# Patient Record
Sex: Female | Born: 1960 | Race: White | Hispanic: No | Marital: Married | Smoking: Never smoker
Health system: Southern US, Community
[De-identification: ages and names within clinical notes are randomized; demographics above are authoritative.]

## PROBLEM LIST (undated history)

## (undated) DIAGNOSIS — E785 Hyperlipidemia, unspecified: Secondary | ICD-10-CM

## (undated) DIAGNOSIS — H9193 Unspecified hearing loss, bilateral: Secondary | ICD-10-CM

## (undated) DIAGNOSIS — H9190 Unspecified hearing loss, unspecified ear: Secondary | ICD-10-CM

## (undated) DIAGNOSIS — G4733 Obstructive sleep apnea (adult) (pediatric): Secondary | ICD-10-CM

## (undated) DIAGNOSIS — T8859XA Other complications of anesthesia, initial encounter: Secondary | ICD-10-CM

## (undated) DIAGNOSIS — F329 Major depressive disorder, single episode, unspecified: Secondary | ICD-10-CM

## (undated) DIAGNOSIS — G629 Polyneuropathy, unspecified: Secondary | ICD-10-CM

## (undated) DIAGNOSIS — K5790 Diverticulosis of intestine, part unspecified, without perforation or abscess without bleeding: Secondary | ICD-10-CM

## (undated) DIAGNOSIS — K429 Umbilical hernia without obstruction or gangrene: Secondary | ICD-10-CM

## (undated) DIAGNOSIS — G43909 Migraine, unspecified, not intractable, without status migrainosus: Secondary | ICD-10-CM

## (undated) DIAGNOSIS — Z8669 Personal history of other diseases of the nervous system and sense organs: Secondary | ICD-10-CM

## (undated) DIAGNOSIS — K760 Fatty (change of) liver, not elsewhere classified: Secondary | ICD-10-CM

## (undated) DIAGNOSIS — J45909 Unspecified asthma, uncomplicated: Secondary | ICD-10-CM

## (undated) DIAGNOSIS — E669 Obesity, unspecified: Secondary | ICD-10-CM

## (undated) DIAGNOSIS — T4145XA Adverse effect of unspecified anesthetic, initial encounter: Secondary | ICD-10-CM

## (undated) DIAGNOSIS — IMO0001 Reserved for inherently not codable concepts without codable children: Secondary | ICD-10-CM

## (undated) DIAGNOSIS — M1990 Unspecified osteoarthritis, unspecified site: Secondary | ICD-10-CM

## (undated) DIAGNOSIS — F32A Depression, unspecified: Secondary | ICD-10-CM

## (undated) DIAGNOSIS — M722 Plantar fascial fibromatosis: Secondary | ICD-10-CM

## (undated) HISTORY — PX: COLONOSCOPY: SHX174

## (undated) HISTORY — PX: NECK SURGERY: SHX720

## (undated) HISTORY — PX: CARPAL TUNNEL RELEASE: SHX101

## (undated) HISTORY — DX: Polyneuropathy, unspecified: G62.9

## (undated) HISTORY — PX: DENTAL SURGERY: SHX609

## (undated) HISTORY — DX: Unspecified hearing loss, bilateral: H91.93

## (undated) HISTORY — DX: Major depressive disorder, single episode, unspecified: F32.9

## (undated) HISTORY — PX: COCHLEAR IMPLANT: SUR684

## (undated) HISTORY — PX: UPPER GI ENDOSCOPY: SHX6162

## (undated) HISTORY — DX: Unspecified hearing loss, unspecified ear: H91.90

## (undated) HISTORY — PX: SHOULDER ARTHROSCOPY: SHX128

## (undated) HISTORY — DX: Hyperlipidemia, unspecified: E78.5

## (undated) HISTORY — PX: ABDOMINAL HYSTERECTOMY: SHX81

## (undated) HISTORY — DX: Depression, unspecified: F32.A

## (undated) HISTORY — PX: INNER EAR SURGERY: SHX679

## (undated) HISTORY — DX: Reserved for inherently not codable concepts without codable children: IMO0001

---
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
## 1998-04-27 ENCOUNTER — Other Ambulatory Visit: Admission: RE | Admit: 1998-04-27 | Discharge: 1998-04-27 | Payer: Self-pay | Admitting: Obstetrics and Gynecology

## 1998-06-08 ENCOUNTER — Encounter: Admission: RE | Admit: 1998-06-08 | Discharge: 1998-09-06 | Payer: Self-pay | Admitting: Gynecology

## 1998-09-23 ENCOUNTER — Inpatient Hospital Stay (HOSPITAL_COMMUNITY): Admission: AD | Admit: 1998-09-23 | Discharge: 1998-09-23 | Payer: Self-pay | Admitting: Obstetrics & Gynecology

## 1998-09-30 ENCOUNTER — Ambulatory Visit: Admission: RE | Admit: 1998-09-30 | Discharge: 1998-09-30 | Payer: Self-pay | Admitting: Obstetrics and Gynecology

## 1998-11-15 ENCOUNTER — Inpatient Hospital Stay (HOSPITAL_COMMUNITY): Admission: AD | Admit: 1998-11-15 | Discharge: 1998-11-15 | Payer: Self-pay | Admitting: Obstetrics & Gynecology

## 1998-12-11 ENCOUNTER — Inpatient Hospital Stay (HOSPITAL_COMMUNITY): Admission: AD | Admit: 1998-12-11 | Discharge: 1998-12-15 | Payer: Self-pay | Admitting: Obstetrics and Gynecology

## 1998-12-16 ENCOUNTER — Encounter (HOSPITAL_COMMUNITY): Admission: RE | Admit: 1998-12-16 | Discharge: 1999-03-16 | Payer: Self-pay | Admitting: Obstetrics and Gynecology

## 1999-08-02 ENCOUNTER — Other Ambulatory Visit: Admission: RE | Admit: 1999-08-02 | Discharge: 1999-08-02 | Payer: Self-pay | Admitting: Obstetrics and Gynecology

## 1999-09-25 ENCOUNTER — Encounter: Admission: RE | Admit: 1999-09-25 | Discharge: 1999-12-24 | Payer: Self-pay | Admitting: Anesthesiology

## 1999-12-04 ENCOUNTER — Encounter: Payer: Self-pay | Admitting: Family Medicine

## 1999-12-04 ENCOUNTER — Encounter: Admission: RE | Admit: 1999-12-04 | Discharge: 1999-12-04 | Payer: Self-pay | Admitting: Family Medicine

## 2000-01-09 ENCOUNTER — Encounter: Payer: Self-pay | Admitting: Emergency Medicine

## 2000-01-09 ENCOUNTER — Emergency Department (HOSPITAL_COMMUNITY): Admission: EM | Admit: 2000-01-09 | Discharge: 2000-01-09 | Payer: Self-pay | Admitting: Emergency Medicine

## 2000-01-25 ENCOUNTER — Encounter: Admission: RE | Admit: 2000-01-25 | Discharge: 2000-04-01 | Payer: Self-pay | Admitting: Family Medicine

## 2000-02-29 ENCOUNTER — Emergency Department (HOSPITAL_COMMUNITY): Admission: EM | Admit: 2000-02-29 | Discharge: 2000-02-29 | Payer: Self-pay | Admitting: *Deleted

## 2000-04-10 ENCOUNTER — Encounter: Admission: RE | Admit: 2000-04-10 | Discharge: 2000-07-09 | Payer: Self-pay | Admitting: Family Medicine

## 2001-02-19 ENCOUNTER — Other Ambulatory Visit: Admission: RE | Admit: 2001-02-19 | Discharge: 2001-02-19 | Payer: Self-pay | Admitting: Obstetrics and Gynecology

## 2001-09-24 ENCOUNTER — Ambulatory Visit (HOSPITAL_BASED_OUTPATIENT_CLINIC_OR_DEPARTMENT_OTHER): Admission: RE | Admit: 2001-09-24 | Discharge: 2001-09-24 | Payer: Self-pay | Admitting: Orthopedic Surgery

## 2002-09-23 ENCOUNTER — Inpatient Hospital Stay (HOSPITAL_COMMUNITY): Admission: EM | Admit: 2002-09-23 | Discharge: 2002-09-25 | Payer: Self-pay | Admitting: Emergency Medicine

## 2002-09-24 ENCOUNTER — Encounter: Payer: Self-pay | Admitting: Family Medicine

## 2002-09-29 ENCOUNTER — Encounter: Admission: RE | Admit: 2002-09-29 | Discharge: 2002-09-29 | Payer: Self-pay | Admitting: Family Medicine

## 2002-10-11 ENCOUNTER — Encounter: Payer: Self-pay | Admitting: Family Medicine

## 2002-10-11 ENCOUNTER — Encounter: Admission: RE | Admit: 2002-10-11 | Discharge: 2002-10-11 | Payer: Self-pay | Admitting: Family Medicine

## 2002-10-21 ENCOUNTER — Encounter: Payer: Self-pay | Admitting: Family Medicine

## 2002-10-21 ENCOUNTER — Encounter: Admission: RE | Admit: 2002-10-21 | Discharge: 2002-10-21 | Payer: Self-pay | Admitting: Family Medicine

## 2002-11-17 ENCOUNTER — Encounter: Payer: Self-pay | Admitting: Neurosurgery

## 2002-11-18 ENCOUNTER — Encounter: Payer: Self-pay | Admitting: Neurosurgery

## 2002-11-18 ENCOUNTER — Inpatient Hospital Stay (HOSPITAL_COMMUNITY): Admission: RE | Admit: 2002-11-18 | Discharge: 2002-11-20 | Payer: Self-pay | Admitting: Neurosurgery

## 2003-05-06 ENCOUNTER — Encounter: Payer: Self-pay | Admitting: Family Medicine

## 2003-05-06 ENCOUNTER — Encounter: Admission: RE | Admit: 2003-05-06 | Discharge: 2003-05-06 | Payer: Self-pay | Admitting: Family Medicine

## 2004-06-21 ENCOUNTER — Ambulatory Visit (HOSPITAL_COMMUNITY): Admission: RE | Admit: 2004-06-21 | Discharge: 2004-06-21 | Payer: Self-pay | Admitting: Otolaryngology

## 2004-06-26 ENCOUNTER — Ambulatory Visit (HOSPITAL_COMMUNITY): Admission: RE | Admit: 2004-06-26 | Discharge: 2004-06-26 | Payer: Self-pay | Admitting: Otolaryngology

## 2004-06-27 ENCOUNTER — Other Ambulatory Visit: Admission: RE | Admit: 2004-06-27 | Discharge: 2004-06-27 | Payer: Self-pay | Admitting: Obstetrics and Gynecology

## 2004-07-13 ENCOUNTER — Emergency Department (HOSPITAL_COMMUNITY): Admission: EM | Admit: 2004-07-13 | Discharge: 2004-07-14 | Payer: Self-pay | Admitting: Emergency Medicine

## 2004-08-02 ENCOUNTER — Encounter: Admission: RE | Admit: 2004-08-02 | Discharge: 2004-08-02 | Payer: Self-pay | Admitting: Obstetrics and Gynecology

## 2005-02-16 ENCOUNTER — Emergency Department (HOSPITAL_COMMUNITY): Admission: EM | Admit: 2005-02-16 | Discharge: 2005-02-17 | Payer: Self-pay | Admitting: Emergency Medicine

## 2005-03-03 ENCOUNTER — Emergency Department (HOSPITAL_COMMUNITY): Admission: EM | Admit: 2005-03-03 | Discharge: 2005-03-03 | Payer: Self-pay | Admitting: Emergency Medicine

## 2005-05-23 ENCOUNTER — Ambulatory Visit: Payer: Self-pay | Admitting: Internal Medicine

## 2005-06-06 ENCOUNTER — Ambulatory Visit: Payer: Self-pay | Admitting: Internal Medicine

## 2005-06-28 ENCOUNTER — Ambulatory Visit: Payer: Self-pay | Admitting: Internal Medicine

## 2005-07-05 ENCOUNTER — Ambulatory Visit (HOSPITAL_COMMUNITY): Admission: RE | Admit: 2005-07-05 | Discharge: 2005-07-05 | Payer: Self-pay | Admitting: Obstetrics and Gynecology

## 2005-11-05 ENCOUNTER — Ambulatory Visit: Payer: Self-pay | Admitting: Internal Medicine

## 2006-05-17 ENCOUNTER — Ambulatory Visit: Payer: Self-pay | Admitting: Gastroenterology

## 2006-05-21 ENCOUNTER — Ambulatory Visit: Payer: Self-pay | Admitting: Gastroenterology

## 2006-06-12 ENCOUNTER — Ambulatory Visit: Payer: Self-pay | Admitting: Gastroenterology

## 2006-06-12 ENCOUNTER — Encounter (INDEPENDENT_AMBULATORY_CARE_PROVIDER_SITE_OTHER): Payer: Self-pay | Admitting: *Deleted

## 2006-07-02 ENCOUNTER — Encounter (INDEPENDENT_AMBULATORY_CARE_PROVIDER_SITE_OTHER): Payer: Self-pay | Admitting: *Deleted

## 2006-07-02 ENCOUNTER — Inpatient Hospital Stay (HOSPITAL_COMMUNITY): Admission: RE | Admit: 2006-07-02 | Discharge: 2006-07-05 | Payer: Self-pay | Admitting: Obstetrics and Gynecology

## 2006-07-16 ENCOUNTER — Ambulatory Visit: Payer: Self-pay | Admitting: Gastroenterology

## 2006-09-23 ENCOUNTER — Ambulatory Visit: Payer: Self-pay | Admitting: Pulmonary Disease

## 2006-11-01 ENCOUNTER — Ambulatory Visit: Payer: Self-pay | Admitting: Internal Medicine

## 2007-04-23 ENCOUNTER — Emergency Department (HOSPITAL_COMMUNITY): Admission: EM | Admit: 2007-04-23 | Discharge: 2007-04-23 | Payer: Self-pay | Admitting: Emergency Medicine

## 2007-05-01 ENCOUNTER — Ambulatory Visit: Payer: Self-pay | Admitting: Internal Medicine

## 2007-05-01 LAB — CONVERTED CEMR LAB: IgE (Immunoglobulin E), Serum: 37 intl units/mL (ref 0.0–180.0)

## 2007-07-15 ENCOUNTER — Emergency Department (HOSPITAL_COMMUNITY): Admission: EM | Admit: 2007-07-15 | Discharge: 2007-07-15 | Payer: Self-pay | Admitting: Emergency Medicine

## 2007-07-19 ENCOUNTER — Emergency Department (HOSPITAL_COMMUNITY): Admission: EM | Admit: 2007-07-19 | Discharge: 2007-07-20 | Payer: Self-pay | Admitting: Emergency Medicine

## 2007-10-27 DIAGNOSIS — J309 Allergic rhinitis, unspecified: Secondary | ICD-10-CM | POA: Insufficient documentation

## 2007-10-27 DIAGNOSIS — T783XXA Angioneurotic edema, initial encounter: Secondary | ICD-10-CM | POA: Insufficient documentation

## 2007-10-27 DIAGNOSIS — R1310 Dysphagia, unspecified: Secondary | ICD-10-CM | POA: Insufficient documentation

## 2007-10-27 DIAGNOSIS — H9190 Unspecified hearing loss, unspecified ear: Secondary | ICD-10-CM | POA: Insufficient documentation

## 2007-10-27 DIAGNOSIS — R519 Headache, unspecified: Secondary | ICD-10-CM | POA: Insufficient documentation

## 2007-10-27 DIAGNOSIS — G4700 Insomnia, unspecified: Secondary | ICD-10-CM | POA: Insufficient documentation

## 2007-10-27 DIAGNOSIS — J45909 Unspecified asthma, uncomplicated: Secondary | ICD-10-CM | POA: Insufficient documentation

## 2007-10-27 DIAGNOSIS — R51 Headache: Secondary | ICD-10-CM | POA: Insufficient documentation

## 2007-10-28 ENCOUNTER — Encounter: Payer: Self-pay | Admitting: Internal Medicine

## 2007-11-04 ENCOUNTER — Telehealth (INDEPENDENT_AMBULATORY_CARE_PROVIDER_SITE_OTHER): Payer: Self-pay | Admitting: *Deleted

## 2007-11-06 ENCOUNTER — Telehealth (INDEPENDENT_AMBULATORY_CARE_PROVIDER_SITE_OTHER): Payer: Self-pay | Admitting: *Deleted

## 2007-11-20 ENCOUNTER — Encounter: Admission: RE | Admit: 2007-11-20 | Discharge: 2007-11-20 | Payer: Self-pay | Admitting: Obstetrics and Gynecology

## 2007-12-31 ENCOUNTER — Encounter: Payer: Self-pay | Admitting: Internal Medicine

## 2008-01-02 ENCOUNTER — Ambulatory Visit: Payer: Self-pay | Admitting: Internal Medicine

## 2008-01-10 DIAGNOSIS — H6980 Other specified disorders of Eustachian tube, unspecified ear: Secondary | ICD-10-CM | POA: Insufficient documentation

## 2008-01-10 DIAGNOSIS — H6990 Unspecified Eustachian tube disorder, unspecified ear: Secondary | ICD-10-CM | POA: Insufficient documentation

## 2008-01-21 ENCOUNTER — Telehealth (INDEPENDENT_AMBULATORY_CARE_PROVIDER_SITE_OTHER): Payer: Self-pay | Admitting: *Deleted

## 2008-03-26 ENCOUNTER — Ambulatory Visit: Payer: Self-pay | Admitting: Internal Medicine

## 2008-05-06 ENCOUNTER — Ambulatory Visit: Payer: Self-pay | Admitting: Internal Medicine

## 2008-05-06 DIAGNOSIS — M79609 Pain in unspecified limb: Secondary | ICD-10-CM | POA: Insufficient documentation

## 2008-08-09 ENCOUNTER — Ambulatory Visit: Payer: Self-pay | Admitting: Pulmonary Disease

## 2010-07-10 ENCOUNTER — Encounter: Admission: RE | Admit: 2010-07-10 | Discharge: 2010-07-10 | Payer: Self-pay | Admitting: Internal Medicine

## 2010-09-23 ENCOUNTER — Encounter: Payer: Self-pay | Admitting: Otolaryngology

## 2010-09-24 ENCOUNTER — Encounter: Payer: Self-pay | Admitting: Obstetrics & Gynecology

## 2010-12-13 ENCOUNTER — Telehealth: Payer: Self-pay | Admitting: Internal Medicine

## 2010-12-13 NOTE — Telephone Encounter (Signed)
lmomtcb  

## 2010-12-14 NOTE — Telephone Encounter (Signed)
Pt is coming in to see CDY tomorrow at 3:00

## 2010-12-14 NOTE — Telephone Encounter (Signed)
Pt c/o cough, sob, head congestion. She says her allergies are really bad right now. She is currently using zyrtec and states her PCP had given her a cortisone injection as well. She is scheduled to see CDY on Mon., 12/18/2010 but would ike a sooner appt if possible. Pls advise. Allergies  Allergen Reactions  . Fexofenadine   . Naproxen   . Naproxen Sodium   . Prochlorperazine Edisylate   . Zolpidem Tartrate

## 2010-12-14 NOTE — Telephone Encounter (Signed)
lmomtcb x 2  

## 2010-12-14 NOTE — Telephone Encounter (Signed)
Pt states she has tried mucinex d otc and that did not help. Pt states she has tried "everything" over the counter and states Monday is too long for her to have to wait and be seen. Pt is now c/o of having a headache and ear ache. Please advise Dr. Maple Hudson. Thanks  Allergies  Allergen Reactions  . Fexofenadine   . Naproxen   . Naproxen Sodium   . Prochlorperazine Edisylate   . Zolpidem Tartrate      Carver Fila, CMA

## 2010-12-14 NOTE — Telephone Encounter (Signed)
Per CDY-suggest adding Mucinex D OTC.Rhonda Simmons

## 2010-12-15 ENCOUNTER — Encounter: Payer: Self-pay | Admitting: Internal Medicine

## 2010-12-15 ENCOUNTER — Ambulatory Visit (INDEPENDENT_AMBULATORY_CARE_PROVIDER_SITE_OTHER): Payer: Federal, State, Local not specified - PPO | Admitting: Internal Medicine

## 2010-12-15 VITALS — BP 124/80 | HR 111 | Ht 61.0 in | Wt 218.0 lb

## 2010-12-15 DIAGNOSIS — H6980 Other specified disorders of Eustachian tube, unspecified ear: Secondary | ICD-10-CM

## 2010-12-15 DIAGNOSIS — H6990 Unspecified Eustachian tube disorder, unspecified ear: Secondary | ICD-10-CM

## 2010-12-15 DIAGNOSIS — J309 Allergic rhinitis, unspecified: Secondary | ICD-10-CM

## 2010-12-15 MED ORDER — HYDROXYZINE HCL 25 MG PO TABS: 25.0000 mg | ORAL_TABLET | Freq: Three times a day (TID) | ORAL | Status: DC | PRN

## 2010-12-15 MED ORDER — FLUCONAZOLE 150 MG PO TABS: 150.0000 mg | ORAL_TABLET | Freq: Once | ORAL | Status: AC

## 2010-12-15 MED ORDER — PHENYLEPHRINE HCL 1 % NA SOLN
3.0000 [drp] | Freq: Once | NASAL | Status: AC
  Administered 2010-12-15: 3 [drp] via NASAL

## 2010-12-15 MED ORDER — HYDROCODONE-HOMATROPINE 5-1.5 MG/5ML PO SYRP: ORAL_SOLUTION | ORAL | Status: DC

## 2010-12-15 MED ORDER — METHYLPREDNISOLONE ACETATE 80 MG/ML IJ SUSP
80.0000 mg | Freq: Once | INTRAMUSCULAR | Status: AC
  Administered 2010-12-15: 80 mg via INTRAMUSCULAR

## 2010-12-15 MED ORDER — AMOXICILLIN-POT CLAVULANATE 875-125 MG PO TABS: 1.0000 | ORAL_TABLET | Freq: Two times a day (BID) | ORAL | Status: AC

## 2010-12-15 NOTE — Patient Instructions (Signed)
Try rinsing your nasal passages with a Neti pot or squeeze bottle device from the drug store.  Scripts for antibiotic, cough syrup, diflucan for yeast  neb neo nasal   Depo 80

## 2010-12-15 NOTE — Assessment & Plan Note (Addendum)
Rhinosinusitis. We educated on infection vs allergic causes and overlap. We will give neb neo nasal, depo 80, standby script for augmentin. Encourage trial of Neti pot. Will refill cough syrup.

## 2010-12-15 NOTE — Progress Notes (Signed)
Subjective:    Patient ID: Rhonda Simmons, female    DOB: [birth year], 50 y.o.   MRN: [id number]  HPI 50 yo F never smoker, hearing impaired, followed for allergic rhinitis, with hx asthma. Last seen Sept 23, 2009. Now c/o onset 3 weeks ago of malaise, frontal headache, itching skin, post nasal drip- some green. Her PCP gave cortisone inj April 3, albuterol inhaler, Tylenol w/ Codeine for cough. Feels hot at times. She cut her parent's 2-acre yard in late March. Daughter sick in the home with ear infections.  . Review of Systems See HPI Constitutional:   No weight loss, night sweats,  Fevers, chills, fatigue, lassitude. HEENT:   Difficulty swallowing,  Tooth/dental problems,  Sore throat,                No sneezing, itching, ear ache,  CV:  No chest pain,  Orthopnea, PND, swelling in lower extremities, anasarca, dizziness, palpitations  GI  No heartburn, indigestion, abdominal pain, nausea, vomiting, diarrhea, change in bowel habits, loss of appetite  Resp: No excess mucus, no productive cough,  No non-productive cough,  No coughing up of blood.  No change in color of mucus.  No wheezing.  No chest wall deformity  Skin: no rash or lesions.  GU: no dysuria, change in color of urine, no urgency or frequency.  No flank pain.  MS:  No joint pain or swelling.  No decreased range of motion.  No back pain.  Psych:  No change in mood or affect. No depression or anxiety.  No memory loss.   Constitutional:   No weight loss, night sweats,  Fevers, chills, fatigue, lassitude. HEENT:  Difficulty swallowing,  Tooth/dental problems,  Sore throat,             CV:  No chest pain,  Orthopnea, PND, swelling in lower extremities, anasarca, dizziness, palpitations  GI  No heartburn, indigestion, abdominal pain, nausea, vomiting, diarrhea, change in bowel habits, loss of appetite  Resp:No excess mucus, no productive cough,  No non-productive cough,  No coughing up of blood.  No change in color of mucus.   No wheezing.  No chest wall deformity  Skin: no rash or lesions.  GU: no dysuria, change in color of urine, no urgency or frequency.  No flank pain.  MS:  No joint pain or swelling.  No decreased range of motion.  No back pain.  Psych:  No change in mood or affect. No depression or anxiety.  No memory loss.      Objective:   Physical ExamGeneral- Alert, Oriented, Affect-appropriate, Distress- none acute  Skin- rash-none, lesions- none, excoriation- none  Lymphadenopathy- none  Head- atraumatic  Eyes- Gross vision intact, PERRLA, conjunctivae clear secretions  Ears- Hearing impaired- hearing aid.  With hearing aid removed, canals clear, tm scarred, no fluid  Nose- Clear,  No-Septal dev, mucus, polyps, erosion, perforation   Throat- Mallampati II , mucosa clear , drainage- none, tonsils- atrophic  Neck- flexible , trachea midline, no stridor , thyroid nl, carotid no bruit  Chest - symmetrical excursion , unlabored     Heart/CV- RRR , no murmur , no gallop  , no rub, nl s1 s2                     - JVD- none , edema- none, stasis changes- none, varices- none     Lung- clear to P&A, wheeze- none, cough- none , dullness-none, rub- none  Chest wall- atraumatic, no scar  Abd- tender-no, distended-no, bowel sounds-present, HSM- no  Br/ Gen/ Rectal- Not done, not indicated  Extrem- cyanosis- none, clubbing, none, atrophy- none, strength- nl  Neuro- grossly intact to observation         Assessment & Plan:

## 2010-12-19 ENCOUNTER — Encounter: Payer: Self-pay | Admitting: Internal Medicine

## 2010-12-19 NOTE — Assessment & Plan Note (Signed)
Frequent complaints of ear pains seem best related to eustachian dysfunction.

## 2011-01-16 NOTE — Assessment & Plan Note (Signed)
[facility]                             PULMONARY OFFICE NOTE   SHANE, MELBY                      MRN:          [id number]  DATE:05/01/2007                            DOB:          April 13, 1961    PROBLEM:  1. Allergic rhinitis.  2. Headache.  3. Mild intermittent asthma.  4. Insomnia.  5. Angioedema with Midol.  6. Chronic impaired hearing.   HISTORY:  Weather change blamed for headaches, coughing spells.  Albuterol has been some help.  Naproxen seemed to cause angioedema of  tongue and diffuse itching but she says she can take ibuprofen.  She had  also eaten shrimp the same night as the naproxen.  She eats shrimp and  uses ibuprofen and ketoprofen often with no problems.  Has been using  her metered inhaler more.  Complains throat is dry, ears burn and itch  even if she does not have her hearing aids in.   MEDICATIONS:  1. Ketoprofen p.r.n. for headaches.  2. Zyrtec.  3. Lunesta 3 mg at h.s.  4. Cymbalta 60 mg.  5. Albuterol inhaler.  6. Tylenol P.M.  7. Occasional use of hydroxyzine, ibuprofen, promethazine.   DRUG INTOLERANCE:  Apparently of NAPROXEN although she can take other  NSAIDs, COMPAZINE, MIDOL, LUNESTA AND AMBIEN - seem to increase her  dreaming.  ALLEGRA-D blamed for headache.  She was skin test positive in  2006 but has not been on allergy vaccine.   OBJECTIVE:  Weight 185 pounds. Blood pressure 124/76, pulse 100, room  air saturation 96%.  She is alert, hard of hearing, and reads lips quite  a bit.  HEENT:  Tympanic membranes look benign.  RESPIRATORY:  Breathing is unlabored.  HEENT:  There is no nasal congestion or post nasal drip. Pharynx is  clear.  CHEST:  Clear.  CARDIAC:  Heart sounds normal.   IMPRESSION:  Question food allergy to shrimp versus naproxen.  Her story  is a little atypical.  I would have expected a class effect for  nonsteroidals.   PLAN:  1. Food rest.  2. She will avoid Naprosyn.  3. We refilled her hydroxyzine 25 mg for p.r.n. use as an      antihistamine.  Gave her Epi pen with instruction.  4. Consider allergy vaccine.  5. Schedule return in 6 months, earlier prn     Clinton D. Maple Hudson, MD, Tonny Bollman, FACP  Electronically Signed    CDY/MedQ  DD: 05/05/2007  DT: 05/05/2007  Job #: [id number]   cc:   Lenon Curt. Chilton Si, M.D.
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
## 2011-01-19 NOTE — Assessment & Plan Note (Signed)
[facility]                           GASTROENTEROLOGY OFFICE NOTE   LEVEDA, KENDRIX                      MRN:          [id number]  DATE:07/16/2006                            DOB:          18-Oct-1960    Rhonda Simmons returns for follow-up of her endoscopy and colonoscopy, which were  performed on June 12, 2006.  Both of these exams were normal including  ileal biopsies and also duodenal upper GI biopsies.  There was no evidence  of celiac disease.  Ultrasound showed a very echodense fatty liver but  otherwise was unremarkable without evidence of cholelithiasis.   Odena's main complaint continues to be one of belching and burping, and she  has had no response in the past to PPI therapy.  She has recently had a  hysterectomy with some complications, followed by Dr. Vincente Poli.  She denies  bowel problems at this time.  I have given her Librax to use on a p.r.n.  basis and we have reviewed the medication at this time, but I have suggested  she not use this for several weeks because of some recent problems requiring  urinary bladder catheterization.  I will see her again in 2 months' time for  follow-up, and we have given her patient information on IBS and its  management.   ENCLOSURE:  Copy of note from May 17, 2006, to [place] L. Vincente Poli,  M.D., of [facility].     Vania Rea. Jarold Motto, MD, Caleen Essex, FAGA  Electronically Signed    DRP/MedQ  DD: 07/16/2006  DT: 07/16/2006  Job #: [id number]   cc:   Marcelino Duster L. Vincente Poli, M.D.

## 2011-01-19 NOTE — Assessment & Plan Note (Signed)
[facility]                             PULMONARY OFFICE NOTE   KENDEL, PESNELL                      MRN:          [id number]  DATE:11/01/2006                            DOB:          1960-12-17    PROBLEM LIST:  1. Allergic rhinitis.  2. Headache.  3. Mild intermittent asthma.  4. Insomnia.  5. Angioedema with Midol.  6. Chronic impaired hearing.   HISTORY:  Since I last saw her a year ago, she has been seen by Dr.  Sheryn Bison in GI with normal endoscopy and colonoscopy.  Celiac  disease was ruled out.  She was considering gastric bypass surgery.  She  saw our nurse practitioner in January for recent URI with associated  eustachian dysfunction, and she was treated with Veramyst and Mucinex.  She says her right ear pain is persistent, over 1 month, both ears hurt  some, and she was considerably worse when seen by the nurse  practitioner.  She was given what sounds like samples of Xyzal with no  benefit.  She complains of her canals being moist without frank  discharge.  She is cautious about using eardrops because she thinks they  will collect in there.  She has not been working with an ENT physician  on her chronic hearing problems, and accepted my suggestion that it was  time to get her established.   MEDICATIONS:  1. Lunesta 3 mg at h.s. p.r.n.  2. Cymbalta 60 mg.  3. Albuterol rescue inhaler, rarely needed.  4. Tylenol PM.  5. Hydroxyzine 25 mg, used occasionally for sleep.  6. Ibuprofen p.r.n. use and promethazine.   DRUG INTOLERANCE TO COMPAZINE, MIDOL, LUNESTA AND AMBIEN BOTH CAUSE  VIVID DREAMS, ALLEGRA D CAUSED HEADACHE.   OBJECTIVE:  Weight 217 pounds, BP 110/72, pulse regular 103, room air  saturation 97%.  She is wearing braces.  Bilateral hearing aids.  When she removes these,  left canal and TM are clear.  There is a trace of impacted cerumen on  the walls of the right canal, and on the TM itself, minimal erythema,  no  bulging, no fluid.   IMPRESSION:  1. Eustachian dysfunction and chronic impaired hearing.  2. Mild intermittent asthma and allergic rhinitis.   PLAN:  1. I refilled her albuterol, and we gave a trial treatment today with      nasal inhaled Neo-Synephrine, Depo-Medrol 80 mg IM.  2. We are scheduling an appointment for her with Dr. Ermalinda Barrios to      manage her hearing problems.  3. If she is comfortable, she will return in 6 months, earlier p.r.n.     Clinton D. Maple Hudson, MD, Tonny Bollman, FACP  Electronically Signed    CDY/MedQ  DD: 11/02/2006  DT: 11/02/2006  Job #: [id number]   cc:   Carolan Shiver, M.D.  Lenon Curt Chilton Si, M.D.
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
## 2011-01-19 NOTE — Op Note (Signed)
NAME:  Rhonda Simmons, HEACOX NO.:  [id number]   MEDICAL RECORD NO.:  [id number]          PATIENT TYPE:  INP   LOCATION:  9311                          FACILITY:  [facility]   PHYSICIAN:  Michelle L. Grewal, M.D.DATE OF BIRTH:  02-15-1961   DATE OF PROCEDURE:  DATE OF DISCHARGE:  07/05/2006                                 OPERATIVE REPORT   Audio too short to transcribe (less than 5 seconds)      Michelle L. Vincente Poli, M.D.     Florestine Avers  D:  07/15/2006  T:  07/15/2006  Job:  [id number]

## 2011-01-19 NOTE — Assessment & Plan Note (Signed)
[facility]                             PULMONARY OFFICE NOTE   DORETTE, HARTEL                      MRN:          [id number]  DATE:09/23/2006                            DOB:          [birth year]    HISTORY OF PRESENT ILLNESS:  The patient is a 50 year old white female  patient of Dr. Roxy Cedar who has a known history of asthma and rhinitis.  Presents for complaints of a 2 weeks history of nasal congestion, ear  pressure, decreased hearing on the right, and postnasal drip and  drainage.  The patient reports she was initially seen at the Urgent Care  and started on an Ceftin, however, did not have any improvement in  symptoms after 5 days.  She called in to our office and was changed over  to Augmentin for 7 days.  She returns today having taken 5 out of 7 days  of Augmentin without any significant improvement in symptoms.  She  continues to complain of ear pressure, decreased hearing on the right,  nasal congestion, postnasal drip, and dry cough.  The patient denies any  hemoptysis, orthopnea, PND, or leg swelling.  The patient does have  chronic hearing loss and has bilateral hearing aids.   PAST MEDICAL HISTORY:  Reviewed.   CURRENT MEDICATIONS:  Reviewed.   PHYSICAL EXAMINATION:  Patient is a pleasant female in no acute  distress.  She is afebrile with stable vital signs.  O2 saturation is  97% on room air.  HEENT:  Nasal mucosa is pale, nontender sinuses to percussion.  Conjunctivae not injected, posterior pharynx is clear without any  exudate.  Bilateral TMs are normal, EACs are clear.  NECK:  Supple without adenopathy.  LUNGS:  Sounds are clear.  CARDIAC:  Regular rate and rhythm.  ABDOMEN:  Soft and nontender.  EXTREMITIES:  Warm without any edema.   IMPRESSION AND PLAN:  Recent upper respiratory infection, possible early  sinusitis.  The patient with associated eustachian tube dysfunction.  Suspected eustachian tube dysfunction.  The  patient is to finish  Augmentin as prescribed.  Will begin a trial of Zyflo over the next 5  days and Veramyst 2 puffs twice daily.  She will continue on Mucinex  twice daily.  The patient is advised if symptoms do not improve within  the next 3-4 days, will need referral to ear, nose, and  throat.  The patient is to contact our office.  Otherwise, patient to  follow back up with Dr. Maple Hudson as scheduled or sooner if needed.      Rubye Oaks, NP  Electronically Signed      Clinton D. Maple Hudson, MD, Tonny Bollman, FACP  Electronically Signed   TP/MedQ  DD: 09/24/2006  DT: 09/24/2006  Job #: [id number]

## 2011-01-19 NOTE — Op Note (Signed)
NAME:  Rhonda Simmons, Rhonda Simmons                         ACCOUNT NO.:  [id number]   MEDICAL RECORD NO.:  [id number]                   PATIENT TYPE:  INP   LOCATION:  3017                                 FACILITY:  [facility]   PHYSICIAN:  Hilda Lias, M.D.                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   PROCEDURE:  Anterior C5-6, C6-7 diskectomy, decompression of the spinal  cord, bilateral foraminotomy, interbody fusion with tricortical iliac bone  graft, allograft, plate from C5 to C7, microscope.   SURGEON:  Hilda Lias, M.D.   ASSISTANT:  Cristi Loron, M.D.   CLINICAL HISTORY:  The patient is a 50 year old lady who had been  complaining of neck pain with radiation to the arm.  She had weakness of the  biceps and triceps.  X-ray showed spondylosis at the level of 5-6 and 6-7  with narrowing of the canal.  Surgery was advised.   DESCRIPTION OF PROCEDURE:  The patient was taken to the OR and a traction  implant was applied.  The patient has a really short neck.  A transverse  incision was made through the skin, platysma.  Dissection was carried down  all the way down to the cervical spine.  We introduced two needles.  One  turned out to be at 4-5 and the other one at 5-6.  From then on we  identified 5-6 and 6-7.  With the microscope we opened the anterior  ligament.  At the area of 5-6 the area was narrowed, then we had to start  using the loop from the beginning.  From then on we were able to introduce  the curette and did a total diskectomy.  We drilled the spondylosis and we  opened the posterior longitudinal ligament with decompression of the foramen  and  the spinal cord.  The same procedure was done at the level of C6-7.  At  this level also she had spondylosis plus herniated disk.  Having good  decompression, the end plates were drilled and two pieces of bone graft,  tricortical of 7 mm height, were inserted followed by a plate using five  screws.  Lateral C-spine showed good position of the bone graft.  The plate  and the screws were also in good position.  From then on the area was  irrigated, investigation was negative.  The wound was closed with Vicryl and  a Steri-Strip.  Hilda Lias, M.D.    EB/MEDQ  D:  11/18/2002  T:  11/18/2002  Job:  [id number]

## 2011-01-19 NOTE — Assessment & Plan Note (Signed)
[facility]                           GASTROENTEROLOGY OFFICE NOTE   VARSHINI, ARRANTS                      MRN:          [id number]  DATE:05/17/2006                            DOB:          03/04/1961    HISTORY OF PRESENT ILLNESS:  Ms. Vanrossum is a 50 year old white female, self-  referred today for evaluation of abdominal gas, bloating, belching, and  burping. She also has alternating diarrhea and constipation but no history  of rectal bleeding or melena. From what I can surmise, this patient has had  several months of rather classic IBS symptoms. She denies infectious disease  exposure, foreign travel. She also denies lactose intolerance or use of  Sorbitol or Fructose in her diet. She has had abdominal gas, bloating, and  epigastric discomfort with some reflux symptoms and apparently has been on  Aciphex and Nexium per Dr. Rafael Bihari, without improvement. She denies  associated dysphagia or specific capillary biliary complaints but has not  had endoscopy or ultrasound examination. Over the last year, she has had a  veracious appetite and has gained 30 pounds in weight. She currently is on a  dietary pill per her primary care physician and is being evaluated,  apparently for gastric bypass procedure. She has chronic alternating  constipation and diarrhea but denies rectal bleeding. She has not had  colonoscopy or barium studies of her bowel.   PAST MEDICAL HISTORY:  Remarkable for hypercholesterolemia, sleep apnea,  chronic headaches. She does use a C-PAP machine. She has had an ovarian cyst  and is scheduled for laparoscopic surgery on June 09, 2006. She has not  had previous abdominal surgeries.   MEDICATIONS:  Lunesta at bedtime, Cymbalta 6 mg daily for chronic  depression, and Phentermine 37.5 mg daily for weight loss. She uses p.r.n.  albuterol inhalers, promethazine and Ibuprofen.   REVIEW OF SYSTEMS:  The patient does complain of  a dry, non-productive cough  but denies other extra-esophageal manifestations of GERD. She has chronic  insomnia and chronic hearing difficulties. Review of systems otherwise  noncontributory without any known history of cardiovascular, pulmonary, or  psychiatric problems.   FAMILY HISTORY:  Remarkable for ulcerative colitis in hier mother. Her  father has diabetes.   SOCIAL HISTORY:  She is married, lives with her husband, and has some  college education. She does not smoke or use ethanol.   PHYSICAL EXAMINATION:  GENERAL:  A somewhat obese but healthy appearing  white female in no acute distress.  VITAL SIGNS:  She is 5 feet, 2 inches tall and weighs 218.4 pounds. Blood  pressure 118/74, pulse 80 and regular.  HEENT:  I could not appreciate stigmata of chronic liver disease or  thyromegaly.  CHEST:  Clear and I could not appreciate murmurs, gallops, or rubs.  HEART:  She appeared to be in regular rhythm.  ABDOMEN:  Exogenous obesity but no organomegaly, masses, or tenderness.  Bowel sounds were normal.  RECTAL:  Examination deferred.  EXTREMITIES:  Unremarkable.   ASSESSMENT:  I think that Ms. Righetti most likely has irritable bowel  syndrome with upper and  lower gastrointestinal complaints, associated with  such. It is of course possible that she has some type of occult carbohydrate  mal-absorption. On close question, she denies abuse of lactose, Sorbitol, or  fructose. I think it is unlikely that she has symptoms related to  cholelithiasis.   RECOMMENDATIONS:  1. Screening laboratory parameters.  2. Outpatient endoscopy and colonoscopy with small bowel biopsy.  3. Check celiac serologies.  4. Low gas diet along with trial of Librax 1 p.o. t.i.d.  5. I have asked the patient to put her gastric bypass surgery on hold      until we have had a chance to further evaluate her symptomatology and      gastrointestinal tract.  6. Consider discontinuation of Phentermine.                                    Vania Rea. Jarold Motto, MD, Clementeen Graham, [place]   DRP/MedQ  DD:  05/17/2006  DT:  05/18/2006  Job #:  [id number]

## 2011-01-19 NOTE — Discharge Summary (Signed)
NAME:  Rhonda Simmons, Rhonda Simmons NO.:  [id number]   MEDICAL RECORD NO.:  [id number]          PATIENT TYPE:  INP   LOCATION:  9311                          FACILITY:  [facility]   PHYSICIAN:  Michelle L. Grewal, M.D.DATE OF BIRTH:  November 20, 1960   DATE OF ADMISSION:  07/02/2006  DATE OF DISCHARGE:  07/05/2006                                 DISCHARGE SUMMARY   ADMISSION DIAGNOSIS:  Pelvic pain, menorrhagia.   DISCHARGE DIAGNOSES:  Pelvic pain, menorrhagia, status post total abdominal  hysterectomy, exploratory laparotomy, lysis of adhesions, and incidental  cystotomy repair.   HOSPITAL COURSE:  The patient is a 50 year old gravida 2, para 2, who was  admitted, and on the day of admission undergoes a total abdominal  hysterectomy and lysis of adhesions, and repair of incidental cystotomy.  Her EBL at the time of surgery was 200 mL.  By postop day number 1, her  hemoglobin was 9.7.  Her CMET was okay.  Creatinine and BUN were normal.  She was making excellent urine postop.  She was given antibiotics because of  the indwelling catheter.  This incidental cystotomy and cystotomy repair  were discussed immediately with the patient and her family.  Our plan was to  continue Foley to continuous bladder drainage for 7 to 10 days.  She did  very well in the hospital.  By postop day number 3 had good control of her  pain.  Was given ibuprofen, Demerol to take as needed for pain,  ciprofloxacin, Restoril for insomnia, and Fioricet for her headache. She  will follow up on postop day number 7 in the office to just remove the  staples.  She was advised to call if she has any nausea, vomiting,  temperature greater than 100.5, drainage or redness from incision site.  She  will be discharged home with a Foley and a leg bag, and the nurses had  educated her on how to properly use this and take care of it at home, and  she was advised no driving for 1 to 2 weeks.      Michelle L. Vincente Poli, M.D.  Electronically Signed     MLG/MEDQ  D:  07/15/2006  T:  07/15/2006  Job:  [id number]

## 2011-01-19 NOTE — Op Note (Signed)
NAME:  Rhonda Simmons, Rhonda Simmons NO.:  [id number]   MEDICAL RECORD NO.:  [id number]          PATIENT TYPE:  INP   LOCATION:  9311                          FACILITY:  [facility]   PHYSICIAN:  Michelle L. Grewal, M.D.DATE OF BIRTH:  01-02-1961   DATE OF PROCEDURE:  07/02/2006  DATE OF DISCHARGE:  07/05/2006                                 OPERATIVE REPORT   PREOPERATIVE DIAGNOSIS:  Pelvic pain, menorrhagia, and history pelvic  adhesions.   POSTOPERATIVE DIAGNOSIS:  Pelvic pain, menorrhagia, and history pelvic  adhesions and incidental cystotomy.   PROCEDURE:  Exploratory laparotomy, total abdominal hysterectomy, extensive  lysis of adhesions, and repair of incidental cystotomy.   SURGEON:  Dr. Vincente Poli.   ASSISTANT:  Dr. Jennette Kettle.   ANESTHESIA:  General.   SPECIMENS:  Uterus and cervix.   ESTIMATED BLOOD LOSS:  200 cubic centimeters.   COMPLICATIONS:  Incidental cystotomy.   DRAINS:  Foley.   PATHOLOGY:  Uterus and cervix sent to pathology.   PROCEDURE:  The patient was taken to the operating room after informed  consent was obtained.  This is a 50 year old female who has had a  longstanding history of pelvic pain and menorrhagia.  She has had two  previous C-section.  At the time of her last C-section, a tubal ligation was  unable to be performed.  The patient had extensive adhesions to the anterior  abdominal wall and omental adhesions.  It was felt preoperatively that the  uterus was attached to the anterior abdominal wall because the cervix was  deep in the vagina.  After the patient was intubated, she was prepped and  draped in usual sterile fashion.  A Foley catheter was inserted and draining  clear urine.  A low transverse incision was made in the area of the previous  C-section scar and carried down to the fascia.  The fascia was scored in the  midline and extended laterally.  The peritoneum was entered bluntly with  careful attention because of the fear of  adhesions to the anterior abdominal  wall.  Upon entry into the peritoneal cavity, we noted that the uterus was  elevated to the level of the incision and deviated to the right.  It was  densely adherent to the anterior abdominal wall.  We opened the peritoneum  further very carefully and then inserted our self-retaining retractor.  The  patient did have multiple adhesions noted involving the uterus to the  anterior abdominal wall.  The right side of the uterus as well as the right  ovary and tube were unable to be visualized.  At this point, we were able to  visualize the left tube and ovary pretty easily, so we decided to proceed  with the hysterectomy on the left side.  The ovary appeared normal.  We  grasped the triple pedicle and then clamped that using curved Heaney clamps.  Each suture ligature was tied using 0-Vicryl suture.  After we did that, we  then were able to elevate the uterus a little bit more and then carefully  started dissecting the anterior wall of the  uterus away from the abdominal  wall.  At that point, I was able to visualize the round ligament on the  right and the triple pedicle.  The ovary itself appeared normal.  So we  placed a curved Heaney clamp just across the triple pedicle on the right and  it was suture ligated using 0-Vicryl suture.  We elevated the uterus further  and noted that the bladder was pulled up high and adherent to the anterior  portion of the uterus.  We carefully used sharp and blunt dissection to move  the bladder down.  The bladder wall itself was very thin and at the point  that we did that, we noted that a small cystotomy occurred in the dome of  the bladder.  I think it was because the bladder was so densely adherent and  because the bladder wall itself was so thin.  At this point, we decided to  finish up with the hysterectomy, then we would repair the cystotomy at the  completion of hysterectomy so that we completely visualized the  cystotomy.  So after the bladder was pushed down further from the cervix, then we  clamped using curved Heaney clamp on each side to clamp the uterine artery  at the level of the internal os.  The pedicle was suture ligated using 0-  Vicryl suture.  We then clamped uterosacral cardinal ligaments on either  side and the pedicle was suture ligated using 0-Vicryl suture.  We then  easily went down and clamped the cervix just beneath the external os, and  the specimen was removed.  The cystotomy actually was able to help [place] so  that we could use our fingers on the inside to carefully pull the bladder  down from the cervix and make it was well away from our clamps.  After the  specimen was removed, the cuff was closed using figure-of-eight 0-Vicryl  suture.  This was done with excellent hemostasis.  All pedicles were  hemostatic.  At this point, we did ask the anesthesiologist to administer  indigo carmine which was performed.  There was no leakage of blue dye in the  pelvis, so we knew that ureteral integrity was intact.  We then proceeded  with a closure of the cystotomy.  I was able to visualize each end and  closed it in a series of three layers using 2-0 chromic suture and 0-chromic  suture.  We then instructed the surgical tech to fill the bladder through  the Foley with methylene blue.  This was done and the bladder was distended  nicely, and there was confirmation that there was no leakage of blue dye.  Incidentally, prior to the methylene blue being inserted, while we were  preparing cystotomy, there was leakage of blue into the bladder itself to  confirm that the ureters were intact.  After the repair of the cystotomy was  done and the bladder was decompressed and draining nicely, we then irrigated  the pelvis.  There was nothing oozing.  We then closed the peritoneum using  0-Vicryl.  We reapproximated the rectus muscles and we closed the fascia using 0-Vicryl continuous running  stitch starting at each corner and meeting  in the midline.  After irrigation of the subcutaneous layer, we then closed  the subcutaneous layer using plain gut suture interrupted because she had  such deep subcutaneous tissue.  The skin was closed with staples.  All  sponge, lap, and instrument counts were correct x2.  The patient was  extubated and went to recovery room in stable condition.  She will wear the  catheter for 7-10 days.  I will discuss the incidental cystotomy with the  family after I scrub out.  She will be given antibiotics during the interim  as well.      Michelle L. Vincente Poli, M.D.  Electronically Signed     MLG/MEDQ  D:  07/15/2006  T:  07/15/2006  Job:  [id number]

## 2011-01-19 NOTE — Op Note (Signed)
[facility]  Patient:    Rhonda Simmons, Rhonda Simmons Visit Number: [id number] MRN: [id number]          Service Type: DSU Location: [facility] Attending Physician:  Marlowe Shores Dictated by:   Artist Pais Mina Marble, M.D. Proc. Date: 09/24/01 Admit Date:  09/24/2001                             Operative Report  PREOPERATIVE DIAGNOSIS:  Right carpal tunnel syndrome.  POSTOPERATIVE DIAGNOSIS:  Right carpal tunnel syndrome.  PROCEDURES:  Right carpal tunnel release.  SURGEON:  Artist Pais. Mina Marble, M.D.  ASSISTANT:  Junius Roads. Ireton, P.A.C.  ANESTHESIA:  BAER block.  TOURNIQUET TIME:  20 minutes.  COMPLICATIONS:  None.  DRAINS:  None.  DESCRIPTION OF PROCEDURE:  The patient was taken to the operating room.  After the induction of adequate BAER block analgesia, the right upper extremity was prepped and draped in the usual sterile fashion.  Once this was done, a 1.5-2 cm incision was made in the palmar aspect of the right hand in line with the long finger metacarpal at the area of the distal transverse carpal ligament.  The incision was taken down through the skin and subcutaneous tissues.  The palmar fascia was split.  The superficial palmar arch was identified and retracted distally.  The distal edge of the transverse carpal ligament was identified and split with a 15 blade at the underlying median nerve.  At this point in time, using a Therapist, nutritional, a tract was made volar and dorsal to the transverse carpal ligament and the vessel ligament was divided with blunt Metzenbaum scissors until complete visualization of the nerve was achieved.  At this point in time, the wound was thoroughly irrigated and closed with a running 3-0 Prolene subcuticular stitch.  Steri-Strips, 4 x 4s, fluffs, and a compressive dressing were applied.  The patient tolerated the procedure well and went to recovery in stable fashion. Dictated by:   Artist Pais Mina Marble,  M.D. Attending Physician:  Marlowe Shores DD:  09/24/01 TD:  09/24/01 Job: [id number]

## 2011-03-17 ENCOUNTER — Emergency Department (HOSPITAL_COMMUNITY)
Admission: EM | Admit: 2011-03-17 | Discharge: 2011-03-18 | Disposition: A | Discharge status: Home / Self Care | Payer: Federal, State, Local not specified - PPO | Attending: Emergency Medicine | Admitting: Emergency Medicine

## 2011-03-17 DIAGNOSIS — R197 Diarrhea, unspecified: Secondary | ICD-10-CM | POA: Insufficient documentation

## 2011-03-17 DIAGNOSIS — R079 Chest pain, unspecified: Secondary | ICD-10-CM | POA: Insufficient documentation

## 2011-03-17 DIAGNOSIS — IMO0001 Reserved for inherently not codable concepts without codable children: Secondary | ICD-10-CM | POA: Insufficient documentation

## 2011-03-17 DIAGNOSIS — R109 Unspecified abdominal pain: Secondary | ICD-10-CM | POA: Insufficient documentation

## 2011-03-17 DIAGNOSIS — K219 Gastro-esophageal reflux disease without esophagitis: Secondary | ICD-10-CM | POA: Insufficient documentation

## 2011-03-17 DIAGNOSIS — R110 Nausea: Secondary | ICD-10-CM | POA: Insufficient documentation

## 2011-03-17 LAB — URINALYSIS, ROUTINE W REFLEX MICROSCOPIC
Ketones, ur: NEGATIVE mg/dL
Leukocytes, UA: NEGATIVE
Nitrite: NEGATIVE
Protein, ur: NEGATIVE mg/dL
pH: 5.5 (ref 5.0–8.0)

## 2011-03-17 LAB — CBC
Hemoglobin: 12.8 g/dL (ref 12.0–15.0)
MCH: 30.1 pg (ref 26.0–34.0)
Platelets: 294 10*3/uL (ref 150–400)
RBC: 4.25 MIL/uL (ref 3.87–5.11)
WBC: 10.7 10*3/uL — ABNORMAL HIGH (ref 4.0–10.5)

## 2011-03-17 LAB — DIFFERENTIAL
Basophils Absolute: 0 10*3/uL (ref 0.0–0.1)
Basophils Relative: 0 % (ref 0–1)
Eosinophils Absolute: 0.4 10*3/uL (ref 0.0–0.7)
Monocytes Relative: 7 % (ref 3–12)
Neutro Abs: 5.9 10*3/uL (ref 1.7–7.7)
Neutrophils Relative %: 55 % (ref 43–77)

## 2011-03-17 LAB — HEPATIC FUNCTION PANEL
AST: 19 U/L (ref 0–37)
Bilirubin, Direct: 0.1 mg/dL (ref 0.0–0.3)
Indirect Bilirubin: 0.1 mg/dL — ABNORMAL LOW (ref 0.3–0.9)
Total Protein: 6.9 g/dL (ref 6.0–8.3)

## 2011-03-17 LAB — POCT I-STAT, CHEM 8
Calcium, Ion: 1.19 mmol/L (ref 1.12–1.32)
HCT: 40 % (ref 36.0–46.0)
Hemoglobin: 13.6 g/dL (ref 12.0–15.0)
Sodium: 140 mEq/L (ref 135–145)
TCO2: 23 mmol/L (ref 0–100)

## 2011-03-18 ENCOUNTER — Emergency Department (HOSPITAL_COMMUNITY): Payer: Federal, State, Local not specified - PPO

## 2011-03-18 LAB — CK TOTAL AND CKMB (NOT AT ARMC)
CK, MB: 3.4 ng/mL (ref 0.3–4.0)
Relative Index: 1.3 (ref 0.0–2.5)
Total CK: 270 U/L — ABNORMAL HIGH (ref 7–177)

## 2011-06-12 LAB — DIFFERENTIAL
Basophils Absolute: 0
Basophils Relative: 0
Eosinophils Absolute: 0.2
Neutrophils Relative %: 70

## 2011-06-12 LAB — POCT URINALYSIS DIP (DEVICE)
Glucose, UA: NEGATIVE
Nitrite: NEGATIVE
Operator id: 235561
Urobilinogen, UA: 0.2

## 2011-06-12 LAB — CBC
MCHC: 34.2
MCV: 89.6
Platelets: 345

## 2011-10-26 ENCOUNTER — Encounter: Payer: Self-pay | Admitting: Medical

## 2011-10-26 ENCOUNTER — Ambulatory Visit (INDEPENDENT_AMBULATORY_CARE_PROVIDER_SITE_OTHER): Payer: Federal, State, Local not specified - PPO | Admitting: Medical

## 2011-10-26 VITALS — BP 118/70 | HR 88 | Temp 97.9°F | Resp 16 | Ht 61.5 in | Wt 214.0 lb

## 2011-10-26 DIAGNOSIS — H6690 Otitis media, unspecified, unspecified ear: Secondary | ICD-10-CM

## 2011-10-26 DIAGNOSIS — J329 Chronic sinusitis, unspecified: Secondary | ICD-10-CM

## 2011-10-26 MED ORDER — PROMETHAZINE HCL 25 MG PO TABS: 25.0000 mg | ORAL_TABLET | Freq: Four times a day (QID) | ORAL | Status: DC | PRN

## 2011-10-26 MED ORDER — HYDROCODONE-HOMATROPINE 5-1.5 MG/5ML PO SYRP: 5.0000 mL | ORAL_SOLUTION | Freq: Four times a day (QID) | ORAL | Status: DC | PRN

## 2011-10-26 MED ORDER — PSEUDOEPHEDRINE-CODEINE-GG 30-10-100 MG/5ML PO SOLN: 10.0000 mL | Freq: Four times a day (QID) | ORAL | Status: DC | PRN

## 2011-10-26 MED ORDER — FLUCONAZOLE 150 MG PO TABS: 150.0000 mg | ORAL_TABLET | Freq: Once | ORAL | Status: DC

## 2011-10-26 MED ORDER — AMOXICILLIN 875 MG PO TABS: 875.0000 mg | ORAL_TABLET | Freq: Two times a day (BID) | ORAL | Status: DC

## 2011-10-26 NOTE — Progress Notes (Signed)
Subjective:  Rhonda Simmons is a 51 y.o. female who presents as a new patient for 2-3 day hx/o sore throat, cough, headache, nausea, ear pain and fullness.  She is going to a funeral this weekend and wants to be better.  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  She has hx/o chronic bronchitis, sees pulmonology, and she is also hearing impaired.  Otherwise has been in usual state of health.  She is a nonsmoker.  Her daughter has been sick recently with similar symptoms.  She is using Mucinex.  No other aggravating or relieving factors.  No other c/o.  Last primary care provider's practice closed.  Past Medical History  Diagnosis Date  . Hyperlipidemia   . Depression   . Hearing impaired   . Chronic bronchitis     Dr. Maple Hudson   ROS Gen: no fever, chills, sweats Heart: no CP, palpitations Lungs: no SOB, wheezing GI: +nausea, no vomiting   Objective:   Filed Vitals:   10/26/11 1117  BP: 118/70  Pulse: 88  Temp: 97.9 F (36.6 C)  Resp: 16    General appearance: Alert, WD/WN, no distress                             Skin: warm, no rash                           Head: +mild maxillary sinus tenderness,                            Eyes: conjunctiva normal, corneas clear, PERRLA                            Ears: right TM with erythema and retraction, left TM with serous fluid, otherwise  external ear canals normal                          Nose: septum midline, turbinates swollen, with erythema and clear discharge             Mouth/throat: MMM, tongue normal, mild pharyngeal erythema                           Neck: supple, no adenopathy, no thyromegaly, nontender                          Heart: RRR, normal S1, S2, no murmurs                         Lungs: CTA bilaterally, no wheezes, rales, or rhonchi      Assessment and Plan:   Encounter Diagnoses  Name Primary?  . Sinusitis Yes  . Otitis media    Prescription given for Amoxicillin, Diflucan if she gets secondary yeast infection  given her historical tendency, Hycodan syrup for cough prn, and promethazine prn.  Can c/t Mucinex OTC for congestion.  Tylenol or Ibuprofen OTC for fever and malaise.  Discussed symptomatic relief, nasal saline, and call or return if worse or not improving in 2-3 days.

## 2011-10-26 NOTE — Patient Instructions (Addendum)
You have a sinus infection and ear infection.  I recommend you rest, drink plenty of water.  You can continue using the Mucinex OTC.  Sore throat - salt water gargles, warm fluids, OTC Ibuprofen.    Cough - Hycodan syrup can be used every 4-6 hours, but this can make you drowsy.  Begin Amoxicillin antibiotic, twice daily for 10 days.   If you get a yeast infection, you can use the Diflucan tablet 150mg  once.   You can use the Phenergan 25mg  tablet every 6 hours as needed for nausea/vomiting.   If worse or not improving, call or return.  We would be happy to be your primary care/family practice provider.  We treat everything from sickness, illness, to injury, well ness exams, and more.

## 2011-10-28 ENCOUNTER — Other Ambulatory Visit: Payer: Self-pay | Admitting: Medical

## 2011-10-29 ENCOUNTER — Other Ambulatory Visit: Payer: Self-pay | Admitting: Medical

## 2011-10-29 ENCOUNTER — Telehealth: Payer: Self-pay | Admitting: Medical

## 2011-10-29 MED ORDER — AZITHROMYCIN 500 MG PO TABS: 500.0000 mg | ORAL_TABLET | Freq: Every day | ORAL | Status: DC

## 2011-10-29 NOTE — Telephone Encounter (Signed)
RX refill

## 2011-10-29 NOTE — Telephone Encounter (Signed)
Patient states that she is still coughing. She said that as soon as she started the antibiotic she started itching. She states that none of her SX are better. CLS

## 2011-10-29 NOTE — Telephone Encounter (Signed)
zpak sent

## 2011-10-29 NOTE — Telephone Encounter (Signed)
Is she any better?  She has only been on the Amoxicillin 3.5 days.  I had prescribed cough syrup, Amoxicillin and Diflucan.  Does she already have yeast infection symptoms too?    Find out how she is feeling, symptoms, etc?

## 2011-10-30 NOTE — Telephone Encounter (Signed)
PATIENT WAS NOTIFIED YESTERDAY AFTERNOON THAT HER RX WAS SENT TO THE PHARMACY. CLS

## 2011-11-01 ENCOUNTER — Telehealth: Payer: Self-pay | Admitting: Medical

## 2011-11-02 ENCOUNTER — Encounter: Payer: Self-pay | Admitting: Medical

## 2011-11-02 ENCOUNTER — Ambulatory Visit (INDEPENDENT_AMBULATORY_CARE_PROVIDER_SITE_OTHER): Payer: Federal, State, Local not specified - PPO | Admitting: Medical

## 2011-11-02 VITALS — BP 120/70 | HR 72 | Temp 98.3°F | Resp 16

## 2011-11-02 DIAGNOSIS — R05 Cough: Secondary | ICD-10-CM

## 2011-11-02 DIAGNOSIS — R059 Cough, unspecified: Secondary | ICD-10-CM

## 2011-11-02 DIAGNOSIS — R062 Wheezing: Secondary | ICD-10-CM

## 2011-11-02 DIAGNOSIS — R5381 Other malaise: Secondary | ICD-10-CM

## 2011-11-02 DIAGNOSIS — R110 Nausea: Secondary | ICD-10-CM

## 2011-11-02 DIAGNOSIS — R5383 Other fatigue: Secondary | ICD-10-CM

## 2011-11-02 MED ORDER — BENZONATATE 200 MG PO CAPS: 200.0000 mg | ORAL_CAPSULE | Freq: Two times a day (BID) | ORAL | Status: AC | PRN

## 2011-11-02 MED ORDER — ALBUTEROL SULFATE HFA 108 (90 BASE) MCG/ACT IN AERS: 2.0000 | INHALATION_SPRAY | Freq: Four times a day (QID) | RESPIRATORY_TRACT | Status: DC | PRN

## 2011-11-02 MED ORDER — PREDNISONE 20 MG PO TABS: ORAL_TABLET | ORAL | Status: DC

## 2011-11-02 NOTE — Progress Notes (Signed)
Subjective:  Rhonda Simmons is a 51 y.o. female who presents with recheck.  I saw her as a new patient on 10/26/11 for cough and similar symptoms.  She reports still having lots of cough, feels very fatigue and weak despite the medication given last visit.   Coughing so hard to the point of vomiting.  Mouth feels dry, drinking a lot of water.  Has bad headache, ear hurts, body aches, chills.  Some diarrhea.  Denies fever.  She has hx/o chronic bronchitis, sees pulmonology, and she is also hearing impaired.  Otherwise has been in usual state of health.  She is a nonsmoker.  Her daughter has been sick recently with similar symptoms. No recent travel, surgery, procedure, no hx/o DVT/PE.  She has been around her mother who has pneumonia.  No other aggravating or relieving factors.  No other c/o.    Past Medical History  Diagnosis Date  . Hyperlipidemia   . Depression   . Hearing impaired   . Chronic bronchitis     Dr. Maple Hudson   ROS Gen: no fever, +chills, +sweats Heart: no CP, palpitations Lungs: no SOB, +wheezing GI: +nausea, + vomiting   Objective:   Filed Vitals:   11/02/11 1604  BP: 120/70  Pulse: 72  Temp: 98.3 F (36.8 C)  Resp: 16    General appearance: Alert, WD/WN, no distress, ill appearing                             Skin: warm, somewhat diaphoretic                           Head: +mild maxillary sinus tenderness,                            Eyes: conjunctiva normal, corneas clear, PERRLA                            Ears: Tms pearly, external ear canals normal                          Nose: septum midline, turbinates swollen, with erythema and clear discharge             Mouth/throat: MMM, tongue normal, mild pharyngeal erythema                           Neck: supple, no adenopathy, no thyromegaly, nontender                          Heart: RRR, normal S1, S2, no murmurs                         Lungs: right upper field somewhat decreased, otherwise, no wheezes, rales, or  rhonchi      Assessment and Plan:   Encounter Diagnoses  Name Primary?  . Cough Yes  . Fatigue   . Wheezing   . Nausea    CXR today with no obvious pneumonia, mass or cardiomegaly.   Rest, hydrate well, script for albuterol inhaler, 5 day course of prednisone, and tessalon Perles.  If worse or not improving, call or return.

## 2011-11-07 NOTE — Telephone Encounter (Signed)
DONE

## 2011-11-08 ENCOUNTER — Telehealth: Payer: Self-pay | Admitting: Internal Medicine

## 2011-11-08 NOTE — Telephone Encounter (Signed)
Message copied by Joslyn Hy on Thu Nov 08, 2011  2:40 PM ------      Message from: Jac Canavan      Created: Thu Nov 08, 2011 12:50 PM       Radiologist read chest xray as normal.  See if she is doing better?

## 2011-11-08 NOTE — Telephone Encounter (Signed)
Notified pt of xray that it was normal. However pt states she is doing better but still has a bit of a cough

## 2011-11-09 ENCOUNTER — Telehealth: Payer: Self-pay | Admitting: Family Medicine

## 2011-11-09 NOTE — Telephone Encounter (Signed)
Lmom for the pt. To cb. CLS

## 2011-11-09 NOTE — Telephone Encounter (Signed)
Message copied by Janeice Robinson on Fri Nov 09, 2011  2:34 PM ------      Message from: Jac Canavan      Created: Thu Nov 08, 2011 12:50 PM       Radiologist read chest xray as normal.  See if she is doing better?

## 2011-11-09 NOTE — Telephone Encounter (Signed)
Patient states that she is doing much better. CLS     Patient was notified of her xray results. CLS

## 2011-11-09 NOTE — Telephone Encounter (Signed)
Message copied by Janeice Robinson on Fri Nov 09, 2011  8:31 AM ------      Message from: Aleen Campi, DAVID S      Created: Thu Nov 08, 2011 12:50 PM       Radiologist read chest xray as normal.  See if she is doing better?

## 2012-08-01 ENCOUNTER — Emergency Department (HOSPITAL_COMMUNITY)
Admission: EM | Admit: 2012-08-01 | Discharge: 2012-08-01 | Disposition: A | Discharge status: Home / Self Care | Payer: Federal, State, Local not specified - PPO | Source: Home / Self Care | Attending: Family Medicine | Admitting: Family Medicine

## 2012-08-01 ENCOUNTER — Encounter (HOSPITAL_COMMUNITY): Payer: Self-pay | Admitting: Emergency Medicine

## 2012-08-01 DIAGNOSIS — H6690 Otitis media, unspecified, unspecified ear: Secondary | ICD-10-CM

## 2012-08-01 DIAGNOSIS — H6091 Unspecified otitis externa, right ear: Secondary | ICD-10-CM

## 2012-08-01 DIAGNOSIS — H60399 Other infective otitis externa, unspecified ear: Secondary | ICD-10-CM

## 2012-08-01 MED ORDER — HYDROCODONE-ACETAMINOPHEN 5-500 MG PO TABS: 1.0000 | ORAL_TABLET | Freq: Three times a day (TID) | ORAL | Status: DC | PRN

## 2012-08-01 MED ORDER — CIPROFLOXACIN-DEXAMETHASONE 0.3-0.1 % OT SUSP: 4.0000 [drp] | Freq: Two times a day (BID) | OTIC | Status: DC

## 2012-08-01 MED ORDER — FLUCONAZOLE 150 MG PO TABS: ORAL_TABLET | ORAL | Status: DC

## 2012-08-01 MED ORDER — AMOXICILLIN 500 MG PO CAPS: 500.0000 mg | ORAL_CAPSULE | Freq: Three times a day (TID) | ORAL | Status: DC

## 2012-08-01 NOTE — ED Notes (Signed)
Reports right ear throbbing pain.  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  Reports pain for two days but has gotten worst.  Patient has had pain in ear previously in the past.  Tried heating pad but no relief.  Patient feels as neck pain is coming from ear pain.  Denies feeling anything crawling in ear.

## 2012-08-02 NOTE — ED Provider Notes (Addendum)
History     CSN: [id number]  Arrival date & time 08/01/12  1013   First MD Initiated Contact with Patient 08/01/12 1057      Chief Complaint  Patient presents with  . 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 Symptoms associated with nasal congestion, clear rhinorrhea and mild sore throat. Reports tenderness in right side of neck below the ear. No ear drainage. No fever or chills. Reports chronic tinnitus not changed. No balance problems.    Past Medical History  Diagnosis Date  . Hyperlipidemia   . Depression   . Hearing impaired   . Chronic bronchitis     Dr. Maple Hudson    Past Surgical History  Procedure Date  . Neck surgery   . Inner ear surgery     cochlear implant - left    History reviewed. No pertinent family history.  History  Substance Use Topics  . Smoking status: Never Smoker   . Smokeless tobacco: Not on file  . Alcohol Use: No    OB History    Grav Para Term Preterm Abortions TAB SAB Ect Mult Living                  Review of Systems  Constitutional: Negative for fever.  HENT: Positive for ear pain, congestion, rhinorrhea and neck pain. Negative for sinus pressure.   Eyes: Negative for discharge.  Respiratory: Negative for cough, shortness of breath and wheezing.   Cardiovascular: Negative for chest pain.  Gastrointestinal: Negative for nausea and vomiting.  Skin: Negative for rash.  Neurological: Negative for dizziness and headaches.  All other systems reviewed and are negative.    Allergies  Compazine; Fexofenadine; Naproxen; Naproxen sodium; Prochlorperazine edisylate; and Zolpidem tartrate  Home Medications   Current Outpatient Rx  Name  Route  Sig  Dispense  Refill  . ALBUTEROL SULFATE HFA 108 (90 BASE) MCG/ACT IN  AERS   Inhalation   Inhale 2 puffs into the lungs 4 (four) times daily.           . ALBUTEROL SULFATE HFA 108 (90 BASE) MCG/ACT IN AERS   Inhalation   Inhale 2 puffs into the lungs every 6 (six) hours as needed for wheezing.   1 Inhaler   0   . CETIRIZINE HCL 10 MG PO TABS   Oral   Take 10 mg by mouth daily.           . AMOXICILLIN 500 MG PO CAPS   Oral   Take 1 capsule (500 mg total) by mouth 3 (three) times daily.   21 capsule   0   . BUPROPION HCL ER (XL) 300 MG PO TB24   Oral   Take 300 mg by mouth daily.           Marland Kitchen CIPROFLOXACIN-DEXAMETHASONE 0.3-0.1 % OT SUSP   Right Ear   Place 4 drops into the right ear 2 (two) times daily.   7.5 mL   0     Use for 5-7 days   . DULOXETINE HCL 60 MG PO CPEP   Oral   Take 60 mg by mouth daily.           Marland Kitchen ESTROGENS CONJUGATED 0.3 MG PO TABS   Oral   Take 0.3 mg by mouth daily. Take daily for 21 days then  do not take for 7 days.          Marland Kitchen FLUCONAZOLE 150 MG PO TABS      1 tab by mouth now then in 72 hours   2 tablet   0   . HYDROCODONE-ACETAMINOPHEN 5-500 MG PO TABS   Oral   Take 1 tablet by mouth every 8 (eight) hours as needed for pain.   10 tablet   0   . PREDNISONE 20 MG PO TABS      3 tablets daily for 2 days, then 2 tablets daily for 3 days   12 tablet   0   . PROMETHAZINE HCL 25 MG PO TABS   Oral   Take 1 tablet (25 mg total) by mouth every 6 (six) hours as needed.   20 tablet   0     BP 129/73  Pulse 95  Temp 98.2 F (36.8 C) (Oral)  Resp 18  SpO2 100%  Physical Exam  Nursing note and vitals reviewed. Constitutional: She is oriented to person, place, and time. She appears well-developed and well-nourished.       Uncomfortable with pain.  HENT:  Head: Normocephalic and atraumatic.  Left Ear: External ear normal.       Left TM normal. Right ear canal with mild to moderate swelling erythema and scant white/yellow adherent exudates. TM appears dull and white, no redness or swelling.  (unsure about regular appearance of pt's right TM previously). No tenderness or erythema over mastoid processes.   Neck: Normal range of motion. Neck supple.       Reported tenderness in right retro and submandibular area, no obvious enlarged lymph node. TMJ with FROM.   Cardiovascular: Normal heart sounds.   Pulmonary/Chest: Breath sounds normal.  Neurological: She is alert and oriented to person, place, and time.  Skin: No rash noted.    ED Course  Procedures (including critical care time)  Labs Reviewed - No data to display No results found.   1. External otitis of right ear   2. Otitis media       MDM  Treated with amoxicillin, Vicodin, Ciprodex. Continue cetirizine. Asked to remove right side hearing aid until symptoms resolved. Patient requested and i provided diflucan prescription due to h/o yeast infection with antibiotics. Follow up with ENT as needed if no improvement or persistent symptoms despite following treatment. Supportive care and red flags that should prompt patients return to medical attentions discussed and provided in writing.         Sharin Grave, MD 08/02/12 2230  Sharin Grave, MD 08/02/12 2232

## 2012-12-01 ENCOUNTER — Emergency Department (HOSPITAL_COMMUNITY)
Admission: EM | Admit: 2012-12-01 | Discharge: 2012-12-01 | Disposition: A | Discharge status: Home / Self Care | Payer: Federal, State, Local not specified - PPO | Attending: Emergency Medicine | Admitting: Emergency Medicine

## 2012-12-01 ENCOUNTER — Emergency Department (HOSPITAL_COMMUNITY): Payer: Federal, State, Local not specified - PPO

## 2012-12-01 ENCOUNTER — Encounter (HOSPITAL_COMMUNITY): Payer: Self-pay | Admitting: *Deleted

## 2012-12-01 DIAGNOSIS — Z862 Personal history of diseases of the blood and blood-forming organs and certain disorders involving the immune mechanism: Secondary | ICD-10-CM | POA: Insufficient documentation

## 2012-12-01 DIAGNOSIS — Z79899 Other long term (current) drug therapy: Secondary | ICD-10-CM | POA: Insufficient documentation

## 2012-12-01 DIAGNOSIS — S60221A Contusion of right hand, initial encounter: Secondary | ICD-10-CM

## 2012-12-01 DIAGNOSIS — S60229A Contusion of unspecified hand, initial encounter: Secondary | ICD-10-CM | POA: Insufficient documentation

## 2012-12-01 DIAGNOSIS — H9190 Unspecified hearing loss, unspecified ear: Secondary | ICD-10-CM | POA: Insufficient documentation

## 2012-12-01 DIAGNOSIS — Z8639 Personal history of other endocrine, nutritional and metabolic disease: Secondary | ICD-10-CM | POA: Insufficient documentation

## 2012-12-01 DIAGNOSIS — K219 Gastro-esophageal reflux disease without esophagitis: Secondary | ICD-10-CM | POA: Insufficient documentation

## 2012-12-01 DIAGNOSIS — IMO0002 Reserved for concepts with insufficient information to code with codable children: Secondary | ICD-10-CM | POA: Insufficient documentation

## 2012-12-01 DIAGNOSIS — Y929 Unspecified place or not applicable: Secondary | ICD-10-CM | POA: Insufficient documentation

## 2012-12-01 DIAGNOSIS — Z8659 Personal history of other mental and behavioral disorders: Secondary | ICD-10-CM | POA: Insufficient documentation

## 2012-12-01 DIAGNOSIS — Z8709 Personal history of other diseases of the respiratory system: Secondary | ICD-10-CM | POA: Insufficient documentation

## 2012-12-01 DIAGNOSIS — Y9389 Activity, other specified: Secondary | ICD-10-CM | POA: Insufficient documentation

## 2012-12-01 LAB — URINALYSIS, MICROSCOPIC ONLY
Leukocytes, UA: NEGATIVE
Protein, ur: NEGATIVE mg/dL
Urobilinogen, UA: 0.2 mg/dL (ref 0.0–1.0)

## 2012-12-01 LAB — CBC WITH DIFFERENTIAL/PLATELET
Eosinophils Relative: 3 % (ref 0–5)
HCT: 41.3 % (ref 36.0–46.0)
Lymphocytes Relative: 35 % (ref 12–46)
Lymphs Abs: 2.7 10*3/uL (ref 0.7–4.0)
MCV: 86.9 fL (ref 78.0–100.0)
Monocytes Absolute: 0.7 10*3/uL (ref 0.1–1.0)
RBC: 4.75 MIL/uL (ref 3.87–5.11)
RDW: 12.7 % (ref 11.5–15.5)
WBC: 7.8 10*3/uL (ref 4.0–10.5)

## 2012-12-01 LAB — COMPREHENSIVE METABOLIC PANEL
BUN: 11 mg/dL (ref 6–23)
CO2: 28 mEq/L (ref 19–32)
Calcium: 10 mg/dL (ref 8.4–10.5)
Creatinine, Ser: 0.62 mg/dL (ref 0.50–1.10)
GFR calc Af Amer: >90 mL/min (ref >90)
GFR calc non Af Amer: >90 mL/min (ref >90)
Glucose, Bld: 93 mg/dL (ref 70–99)

## 2012-12-01 LAB — POCT I-STAT TROPONIN I

## 2012-12-01 MED ORDER — FAMOTIDINE 20 MG PO TABS: 20.0000 mg | ORAL_TABLET | Freq: Two times a day (BID) | ORAL | Status: DC

## 2012-12-01 MED ORDER — SUCRALFATE 1 G PO TABS: 1.0000 g | ORAL_TABLET | Freq: Four times a day (QID) | ORAL | Status: DC

## 2012-12-01 MED ORDER — HYDROCODONE-ACETAMINOPHEN 5-325 MG PO TABS: 2.0000 | ORAL_TABLET | ORAL | Status: DC | PRN

## 2012-12-01 NOTE — ED Provider Notes (Signed)
History     CSN: [id number]  Arrival date & time 12/01/12  1432   First MD Initiated Contact with Patient 12/01/12 1714      Chief Complaint  Patient presents with  . Hand Pain  . Abdominal Pain    (Consider location/radiation/quality/duration/timing/severity/associated sxs/prior treatment) Patient is a 52 y.o. female presenting with hand pain and abdominal pain. The history is provided by the patient.  Hand Pain Associated symptoms include abdominal pain.  Abdominal Pain  patient here with epigastric pain has started after she ate [employer]. Denies anginal type symptoms. Described as a burning sensation in her mid epigastric area radiating to her back. This lasted for approximate 40 minutes and resolved on their own. She does fill some shortness of breath with this. And is resolved. She also notes that when she burped she felt much better  Patient also complains of right hand pain after she punched in an aluminum siding because she was upset pain characterized as dull worse with movement of her hand. Denies any wrist pain at this time. No treatment used for this prior to arrival. Denies any numbness to the hand.  Past Medical History  Diagnosis Date  . Hyperlipidemia   . Depression   . Hearing impaired   . Chronic bronchitis     Dr. Maple Hudson    Past Surgical History  Procedure Laterality Date  . Neck surgery    . Inner ear surgery      cochlear implant - left    No family history on file.  History  Substance Use Topics  . Smoking status: Never Smoker   . Smokeless tobacco: Not on file  . Alcohol Use: No    OB History   Grav Para Term Preterm Abortions TAB SAB Ect Mult Living                  Review of Systems  Gastrointestinal: Positive for abdominal pain.  All other systems reviewed and are negative.    Allergies  Compazine; Fexofenadine; Midol; Naproxen sodium; Prochlorperazine edisylate; and Zolpidem tartrate  Home Medications   Current Outpatient  Rx  Name  Route  Sig  Dispense  Refill  . ibuprofen (ADVIL,MOTRIN) 400 MG tablet   Oral   Take 400 mg by mouth every 6 (six) hours as needed for pain.         Marland Kitchen omeprazole (PRILOSEC OTC) 20 MG tablet   Oral   Take 20 mg by mouth daily.           BP 147/89  Pulse 84  Temp(Src) 98.1 F (36.7 C) (Oral)  Resp 16  Ht 5\' 2"  (1.575 m)  Wt 210 lb (95.255 kg)  BMI 38.4 kg/m2  SpO2 96%  Physical Exam  Nursing note and vitals reviewed. Constitutional: She is oriented to person, place, and time. She appears well-developed and well-nourished.  Non-toxic appearance. No distress.  HENT:  Head: Normocephalic and atraumatic.  Eyes: Conjunctivae, EOM and lids are normal. Pupils are equal, round, and reactive to light.  Neck: Normal range of motion. Neck supple. No tracheal deviation present. No mass present.  Cardiovascular: Normal rate, regular rhythm and normal heart sounds.  Exam reveals no gallop.   No murmur heard. Pulmonary/Chest: Effort normal and breath sounds normal. No stridor. No respiratory distress. She has no decreased breath sounds. She has no wheezes. She has no rhonchi. She has no rales.  Abdominal: Soft. Normal appearance and bowel sounds are normal. She exhibits no distension. There  is no tenderness. There is no rebound and no CVA tenderness.  Musculoskeletal: Normal range of motion. She exhibits no edema and no tenderness.       Right hand: She exhibits bony tenderness. She exhibits normal range of motion, no deformity and no laceration.       Hands: Neurological: She is alert and oriented to person, place, and time. She has normal strength. No cranial nerve deficit or sensory deficit. GCS eye subscore is 4. GCS verbal subscore is 5. GCS motor subscore is 6.  Skin: Skin is warm and dry. No abrasion and no rash noted.  Psychiatric: She has a normal mood and affect. Her speech is normal and behavior is normal.    ED Course  Procedures (including critical care  time)  Labs Reviewed  URINALYSIS, MICROSCOPIC ONLY - Abnormal; Notable for the following:    Bacteria, UA FEW (*)    Squamous Epithelial / LPF FEW (*)    All other components within normal limits  CBC WITH DIFFERENTIAL  COMPREHENSIVE METABOLIC PANEL  LIPASE, BLOOD  POCT I-STAT TROPONIN I   Dg Hand Complete Right  12/01/2012  *RADIOLOGY REPORT*  Clinical Data: Pain after hitting a house.  RIGHT HAND - COMPLETE 3+ VIEW  Comparison: None.  Findings: Three-view exam shows no evidence for acute fracture.  No subluxation or dislocation.  Tiny fragment adjacent to the IP joint of the thumb suggest previous trauma.  No worrisome lytic or sclerotic osseous abnormality.  IMPRESSION: No acute bony findings.   Original Report Authenticated By: Kennith Center, M.D.      No diagnosis found.    MDM  Patient's labs and x-rays noted. She has no signs of hand fracture at this time. Her other symptoms likely from GERD. Will place on medications and discharged home. ecg and cardiac enzymes neg. No concern for acs   Date: 12/01/2012  Rate: 89  Rhythm: normal sinus rhythm  QRS Axis: normal  Intervals: normal  ST/T Wave abnormalities: nonspecific T wave changes  Conduction Disutrbances:none  Narrative Interpretation:   Old EKG Reviewed: none available        Toy Baker, MD 12/01/12 1725

## 2012-12-01 NOTE — ED Notes (Signed)
Pt in bathroom attempting to provide an urine specimen

## 2012-12-01 NOTE — ED Notes (Signed)
Pt with 2 complaints.  She punched the aluminum siding out of anger.  Wrist and post hand painful .  2nd and 3rd knuckles swollen.     Pt c/o acute onset epigastric pain and bil back pain after eating at [facility].  Pt took "purple pill" with no relief.  Pt states pain is increasing and "heavy" and she is experiencing sob.

## 2012-12-12 ENCOUNTER — Other Ambulatory Visit: Payer: Self-pay | Admitting: Obstetrics and Gynecology

## 2013-02-03 ENCOUNTER — Ambulatory Visit (INDEPENDENT_AMBULATORY_CARE_PROVIDER_SITE_OTHER): Payer: Federal, State, Local not specified - PPO | Admitting: Neurology

## 2013-02-03 ENCOUNTER — Encounter: Payer: Self-pay | Admitting: Neurology

## 2013-02-03 VITALS — BP 125/73 | HR 84 | Ht 61.5 in | Wt 215.0 lb

## 2013-02-03 DIAGNOSIS — G629 Polyneuropathy, unspecified: Secondary | ICD-10-CM | POA: Insufficient documentation

## 2013-02-03 DIAGNOSIS — R209 Unspecified disturbances of skin sensation: Secondary | ICD-10-CM

## 2013-02-03 DIAGNOSIS — R208 Other disturbances of skin sensation: Secondary | ICD-10-CM

## 2013-02-03 DIAGNOSIS — IMO0001 Reserved for inherently not codable concepts without codable children: Secondary | ICD-10-CM

## 2013-02-03 DIAGNOSIS — M791 Myalgia, unspecified site: Secondary | ICD-10-CM

## 2013-02-03 DIAGNOSIS — H9193 Unspecified hearing loss, bilateral: Secondary | ICD-10-CM | POA: Insufficient documentation

## 2013-02-03 DIAGNOSIS — G589 Mononeuropathy, unspecified: Secondary | ICD-10-CM

## 2013-02-03 NOTE — Addendum Note (Signed)
Addended by: Melvyn Novas on: 02/03/2013 05:07 PM   Modules accepted: Orders

## 2013-02-03 NOTE — Assessment & Plan Note (Signed)
The patient's family history is of autoimmune diseases, and the patient herself struggles with weight loss. I advised that her elevated BMI and the abdominal fat can contribute to impingement at the groin. In addition I will obtain an autoimmune panel and reevaluate her cervical spine which he had surgery before. She had an anterior spinal fusion in the year 2002.

## 2013-02-03 NOTE — Progress Notes (Signed)
[facility]  Provider:  Dr Haneefah Venturini Referring Provider: Tomi Bamberger, NP Primary Care Physician:  Tomi Bamberger, NP  Chief Complaint  Patient presents with  . New Evaluation    Harris,paresthesas/tingling, paper referral, rm 10    HPI:  Rhonda Simmons is a 52 y.o. female here as a referral from Dr. Tiburcio Pea.   The patient asked for an evaluation of abnormal sensations in her thigh, mostly the right, when she stands for a prolonged period of time. The tingling and sometimes prickly sensation resolves when she moves. It does not bother her at night. The area affected is between the groin and knee in the anterior right only, her hamstring area is unaffected.  The  symptom started probably in early April. At the same time this patient had noticed a deer tick in her bellybutton. The tic needed to be surgically removed, but there was no target shape rash or skin lesion according to the patient. She explained her concerns to her gynecologist Dr. Redmond School, who in return suggested a neurologic workup .  Patient is 52 years of age, married the mother of 2 teenagers. 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 She reports that her ophthalmologist is concerned  about her developing possibly glaucoma. She reports to me that her vision and her visual acuity has declined over the last month, and more in the left than right eye.    The patient's family history is positive for multiple TIAs in her mother, who also had syncope spells and depression, finally developed dementia , too. Her mother is ANA positive.   Dementia in her father in a progressed state after 10 years. Her sister was diagnosed with myasthenia gravis, after developing ptosis.   Review of Systems: Out of a complete 14 system review, the patient complains of only the following symptoms, and all other reviewed systems are negative. See above .  History   Social History  . Marital  Status: Married    Spouse Name: N/A    Number of Children: 2  . Years of Education: assoc   Occupational History  . caretaker     for parents   Social History Main Topics  . Smoking status: Never Smoker   . Smokeless tobacco: Not on file  . Alcohol Use: Yes     Comment: occas  . Drug Use: No  . Sexually Active: Not on file   Other Topics Concern  . Not on file   Social History Narrative  . No narrative on file    Family History  Problem Relation Age of Onset  . Alzheimer's disease Mother   . Alzheimer's disease Father     Past Medical History  Diagnosis Date  . Hyperlipidemia   . Depression   . Hearing impaired   . Chronic bronchitis     Dr. Maple Hudson  . Neuropathy   . 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 Neck surgery    . Inner ear surgery      cochlear implant - left    Current Outpatient Prescriptions  Medication Sig Dispense Refill  . DULoxetine (CYMBALTA) 30 MG capsule Take 30 mg by mouth daily.      Marland Kitchen ibuprofen (ADVIL,MOTRIN) 400 MG tablet Take 400 mg by mouth every 6 (six) hours as needed for pain.      . Lorcaserin HCl (BELVIQ) 10 MG TABS  Take by mouth 2 (two) times daily.       No current facility-administered medications for this visit.    Allergies as of 02/03/2013 - Review Complete 02/03/2013  Allergen Reaction Noted  . Compazine  10/26/2011  . Fexofenadine  10/27/2007  . Midol (aspirin-cinnamedrine-caffeine)  12/01/2012  . Naproxen sodium    . Prochlorperazine edisylate  10/27/2007  . Zolpidem tartrate  10/27/2007    Vitals: BP 125/73  Pulse 84  Ht 5' 1.5" (1.562 m)  Wt 215 lb (97.523 kg)  BMI 39.97 kg/m2 Last Weight:  Wt Readings from Last 1 Encounters:  02/03/13 215 lb (97.523 kg)   Last Height:   Ht Readings from Last 1 Encounters:  02/03/13 5' 1.5" (1.562 m)   Vision Screening:   Vitals.  Physical exam:  General: The patient is awake, alert and appears not in  acute distress. The patient is well groomed. Head: Normocephalic, atraumatic. Neck is supple. Mallampati 2 , neck circumference: 16.5 inches . No retrognathia.  Cardiovascular:  Regular rate and rhythm, without  murmurs or carotid bruit, and without distended neck veins. Respiratory: Lungs are clear to auscultation. Skin:  Without evidence of edema, or rash Trunk: BMI is elevated and patient  has normal posture.  Neurologic exam : The patient is awake and alert, oriented to place and time.  Memory subjective  described as intact. There is a normal attention span & concentration ability. Speech is fluent with the prosodial changes due to her deafness. Not dysarthria, dysphonia or aphasia. Mood and affect are appropriate.  Cranial nerves: Pupils are equal and briskly reactive to light. Funduscopic exam without evidence of pallor or edema. Extraocular movements  in vertical and horizontal planes intact and without nystagmus. Visual fields by finger perimetry are intact. Hearing: patient has cochlear implants.   Facial sensation intact to fine touch. Facial motor strength is symmetric and tongue and uvula move midline.  Motor exam:   Normal tone and normal muscle bulk and symmetric normal strength in all extremities.  Sensory:  Fine touch, pinprick and vibration were tested in all extremities. Proprioception is tested in the upper extremities only. This was  normal.  Coordination: Rapid alternating movements in the fingers/hands is tested and normal. Finger-to-nose maneuver tested and normal without evidence of ataxia, dysmetria or tremor.  Gait and station: Patient walks without assistive device and is able and assisted stool climb up to the exam table. Strength within normal limits. Stance is stable and normal. Tandem gait is  unfragmented. Romberg testing is normal.  Deep tendon reflexes: in the  upper and lower extremities are symmetric and intact. Babinski maneuver response is  downgoing.   Assessment:  After physical and neurologic examination, review of laboratory studies, imaging, neurophysiology testing and pre-existing records, assessment will be reviewed on the problem list.  This patient presents with abnormal sensory in the anterior thigh an area that is innervated by her anterior femoral nerve branch. Concerning is that she also feels that she wobbles or loses strength in her legs while this tingling sensation lasts. She describes her legs as feeling asleep yet completed tingling does not affect the feet. There is a second symptom she mentioned when she is resting supine or in a recliner on the couch. And that is a soreness and achiness when she reinitiate movement even after not having rested longer than 15 minutes.  Over initiate a blood circulation evaluation of the lower extremities, I also would have liked to obtain a brain MRI, which  is not possible to do is a cochlear implant. Alternatively I would like to obtain a CT scan  of the brain and cervical spine. The patient mentioned that she was told by Dr. Nila Nephew that if she would need an MRI scan that would be a possibility to work with a cochlear implant.  Blood tests for Lyme disease and ANA, sarcoid, c reactive protein and sed rate to be obtained. CK and CKMB.  Plan:  Treatment plan .

## 2013-02-03 NOTE — Patient Instructions (Addendum)
  Dear Vernona Rieger, please review the 2 and 5 day diet, also known as intermittent fasting. You may find further information on the Internet, under the name of Dr. Leland Johns.   Exercise to Lose Weight Exercise and a healthy diet may help you lose weight. Your doctor may suggest specific exercises. EXERCISE IDEAS AND TIPS  Choose low-cost things you enjoy doing, such as walking, bicycling, or exercising to workout videos.  Take stairs instead of the elevator.  Walk during your lunch break.  Park your car further away from work or school.  Go to a gym or an exercise class.  Start with 5 to 10 minutes of exercise each day. Build up to 30 minutes of exercise 4 to 6 days a week.  Wear shoes with good support and comfortable clothes.  Stretch before and after working out.  Work out until you breathe harder and your heart beats faster.  Drink extra water when you exercise.  Do not do so much that you hurt yourself, feel dizzy, or get very short of breath. Exercises that burn about 150 calories:  Running 1  miles in 15 minutes.  Playing volleyball for 45 to 60 minutes.  Washing and waxing a car for 45 to 60 minutes.  Playing touch football for 45 minutes.  Walking 1  miles in 35 minutes.  Pushing a stroller 1  miles in 30 minutes.  Playing basketball for 30 minutes.  Raking leaves for 30 minutes.  Bicycling 5 miles in 30 minutes.  Walking 2 miles in 30 minutes.  Dancing for 30 minutes.  Shoveling snow for 15 minutes.  Swimming laps for 20 minutes.  Walking up stairs for 15 minutes.  Bicycling 4 miles in 15 minutes.  Gardening for 30 to 45 minutes.  Jumping rope for 15 minutes.  Washing windows or floors for 45 to 60 minutes. Document Released: 09/22/2010 Document Revised: 11/12/2011 Document Reviewed: 09/22/2010 [facility] Patient Information 2014 [place], [place].

## 2013-02-04 ENCOUNTER — Encounter: Payer: Self-pay | Admitting: Neurology

## 2013-02-04 ENCOUNTER — Telehealth: Payer: Self-pay | Admitting: Neurology

## 2013-02-04 LAB — COMPREHENSIVE METABOLIC PANEL
ALT: 19 IU/L (ref 0–32)
Albumin/Globulin Ratio: 2.1 (ref 1.1–2.5)
CO2: 23 mmol/L (ref 19–28)
Calcium: 9.8 mg/dL (ref 8.7–10.2)
Creatinine, Ser: 0.58 mg/dL (ref 0.57–1.00)
GFR calc non Af Amer: 108 mL/min/{1.73_m2} (ref >59)
Glucose: 89 mg/dL (ref 65–99)
Potassium: 4 mmol/L (ref 3.5–5.2)
Total Protein: 6.9 g/dL (ref 6.0–8.5)

## 2013-02-04 LAB — CK TOTAL AND CKMB (NOT AT ARMC)
CK-MB Index: 1.8 ng/mL (ref 0.0–2.9)
Total CK: 94 U/L (ref 24–173)

## 2013-02-04 LAB — LYME, TOTAL AB TEST/REFLEX: Lyme Ab: <0.91 index (ref 0.00–0.90)

## 2013-02-04 LAB — ANA W/REFLEX IF POSITIVE: Anti Nuclear Antibody(ANA): NEGATIVE

## 2013-02-04 LAB — C-REACTIVE PROTEIN: CRP: 5.1 mg/L — ABNORMAL HIGH (ref 0.0–4.9)

## 2013-02-04 NOTE — Telephone Encounter (Signed)
The laboratory tests returned without helping to find a diagnosis or cause for this patient's problems. I will send her a letter with the results. I'm awaiting her brainCT to be done.Please call her.

## 2013-02-05 ENCOUNTER — Telehealth: Payer: Self-pay | Admitting: Neurology

## 2013-02-05 NOTE — Telephone Encounter (Signed)
Called patient and relayed what Dr. Vickey Huger advised.

## 2013-02-05 NOTE — Telephone Encounter (Signed)
Please call patient with  normal results.the crp is not abnormal.

## 2013-02-09 ENCOUNTER — Ambulatory Visit
Admission: RE | Admit: 2013-02-09 | Discharge: 2013-02-09 | Disposition: A | Discharge status: Home / Self Care | Payer: Federal, State, Local not specified - PPO | Source: Ambulatory Visit | Attending: Neurology | Admitting: Neurology

## 2013-02-09 DIAGNOSIS — R208 Other disturbances of skin sensation: Secondary | ICD-10-CM

## 2013-02-09 DIAGNOSIS — M791 Myalgia, unspecified site: Secondary | ICD-10-CM

## 2013-02-09 NOTE — Telephone Encounter (Signed)
I called pt and gave her the results of her labs (normal).  She stated that she had her CT done this am.   I told her results would be read tomorrow.  She still experiecing numbness in R thigh and sometime arm.  FYI.

## 2013-02-09 NOTE — Progress Notes (Signed)
Dear Mrs. 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 You have brain MRI is necessary to evaluate for other causes. I will also refer you to an ophthalmologist for further workup.   Common Giabella Duhart M.D.

## 2013-02-10 NOTE — Progress Notes (Signed)
Quick Note:  I spoke with patient and relayed MRI cervical spine results, per Dr. Vickey Huger. Patient will discuss in more detail at her next appointment. ______

## 2013-02-11 ENCOUNTER — Telehealth: Payer: Self-pay | Admitting: Neurology

## 2013-02-11 NOTE — Telephone Encounter (Signed)
I returned the patient's call for more clarification on her CT cervical results.  She wanted the doctor to know that her right leg is having more tingling and numbness, this is happening more often and mostly when she is standing.  Her number is  [phone].

## 2013-02-13 ENCOUNTER — Other Ambulatory Visit: Payer: Self-pay | Admitting: Neurology

## 2013-02-13 ENCOUNTER — Encounter: Payer: Self-pay | Admitting: Neurology

## 2013-02-13 ENCOUNTER — Encounter (INDEPENDENT_AMBULATORY_CARE_PROVIDER_SITE_OTHER): Payer: Federal, State, Local not specified - PPO | Admitting: Radiology

## 2013-02-13 ENCOUNTER — Ambulatory Visit (INDEPENDENT_AMBULATORY_CARE_PROVIDER_SITE_OTHER): Payer: Federal, State, Local not specified - PPO | Admitting: Neurology

## 2013-02-13 DIAGNOSIS — R208 Other disturbances of skin sensation: Secondary | ICD-10-CM

## 2013-02-13 DIAGNOSIS — Z0289 Encounter for other administrative examinations: Secondary | ICD-10-CM

## 2013-02-13 DIAGNOSIS — M542 Cervicalgia: Secondary | ICD-10-CM

## 2013-02-13 DIAGNOSIS — M791 Myalgia, unspecified site: Secondary | ICD-10-CM

## 2013-02-13 DIAGNOSIS — G5710 Meralgia paresthetica, unspecified lower limb: Secondary | ICD-10-CM

## 2013-02-13 DIAGNOSIS — G5711 Meralgia paresthetica, right lower limb: Secondary | ICD-10-CM

## 2013-02-13 DIAGNOSIS — R209 Unspecified disturbances of skin sensation: Secondary | ICD-10-CM

## 2013-02-13 MED ORDER — PREDNISONE 10 MG PO TABS: 10.0000 mg | ORAL_TABLET | Freq: Every day | ORAL | Status: DC

## 2013-02-13 NOTE — Telephone Encounter (Signed)
Meds this patient after I was today and explained the results of her CT, her laboratories. I initiated a prednisone Dosepak. 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

## 2013-02-13 NOTE — Procedures (Signed)
  HISTORY:  Rhonda Simmons is a 52 year old patient with a history of obesity and onset of numbness and discomfort involving the anterolateral aspect of her right thigh over the last 1 or 2 months. The patient is being evaluated for a possible neuropathy or a lumbosacral radiculopathy.  NERVE CONDUCTION STUDIES:  Nerve conduction studies were performed on both lower extremities. The distal motor latencies and motor amplitudes for the peroneal and posterior tibial nerves were within normal limits. The nerve conduction velocities for these nerves were also normal. The H reflex latencies were normal. The sensory latencies for the peroneal nerves were within normal limits.    EMG STUDIES:  EMG study was performed on the right lower extremity:  The tibialis anterior muscle reveals 2 to 4K motor units with full recruitment. No fibrillations or positive waves were seen. The peroneus tertius muscle reveals 2 to 4K motor units with full recruitment. No fibrillations or positive waves were seen. The medial gastrocnemius muscle reveals 1 to 3K motor units with full recruitment. No fibrillations or positive waves were seen. The vastus lateralis muscle reveals 2 to 4K motor units with full recruitment. No fibrillations or positive waves were seen. The iliopsoas muscle reveals 2 to 4K motor units with full recruitment. No fibrillations or positive waves were seen. The biceps femoris muscle (long head) reveals 2 to 4K motor units with full recruitment. No fibrillations or positive waves were seen. The lumbosacral paraspinal muscles were tested at 3 levels, and revealed no abnormalities of insertional activity at all 3 levels tested. There was good relaxation.    IMPRESSION:  Nerve conduction studies done on both lower extremities were within normal limits. There is no evidence of an underlying peripheral neuropathy. EMG evaluation of the right lower extremity was also normal. No evidence of a lumbosacral  radiculopathy is seen. The area of sensory alteration described by the patient could be consistent with a right lateral femoral cutaneous neuropathy.  Marlan Palau MD 02/13/2013 4:33 PM  [facility] [address] [place], [place] [address]  Phone [phone] Fax [phone]

## 2013-02-19 NOTE — Progress Notes (Signed)
Quick Note:  I already met with this patient with normal EMG, NCS .Donte Lenzo, MD  ______

## 2013-03-17 ENCOUNTER — Ambulatory Visit: Payer: Federal, State, Local not specified - PPO | Admitting: Nurse Practitioner

## 2013-04-15 ENCOUNTER — Ambulatory Visit: Payer: Federal, State, Local not specified - PPO | Admitting: Nurse Practitioner

## 2013-07-03 ENCOUNTER — Other Ambulatory Visit: Payer: Self-pay | Admitting: Gastroenterology

## 2013-12-03 ENCOUNTER — Encounter (INDEPENDENT_AMBULATORY_CARE_PROVIDER_SITE_OTHER): Payer: Federal, State, Local not specified - PPO | Admitting: Ophthalmology

## 2014-02-24 ENCOUNTER — Encounter (INDEPENDENT_AMBULATORY_CARE_PROVIDER_SITE_OTHER): Payer: Federal, State, Local not specified - PPO | Admitting: Ophthalmology

## 2014-02-25 ENCOUNTER — Encounter (INDEPENDENT_AMBULATORY_CARE_PROVIDER_SITE_OTHER): Payer: Federal, State, Local not specified - PPO | Admitting: Ophthalmology

## 2014-02-25 DIAGNOSIS — H2510 Age-related nuclear cataract, unspecified eye: Secondary | ICD-10-CM

## 2014-02-25 DIAGNOSIS — H43819 Vitreous degeneration, unspecified eye: Secondary | ICD-10-CM

## 2014-02-25 DIAGNOSIS — H35349 Macular cyst, hole, or pseudohole, unspecified eye: Secondary | ICD-10-CM

## 2014-03-10 DIAGNOSIS — G589 Mononeuropathy, unspecified: Secondary | ICD-10-CM | POA: Insufficient documentation

## 2014-04-20 ENCOUNTER — Emergency Department (HOSPITAL_COMMUNITY)
Admission: EM | Admit: 2014-04-20 | Discharge: 2014-04-20 | Disposition: A | Discharge status: Home / Self Care | Payer: Federal, State, Local not specified - PPO | Attending: Emergency Medicine | Admitting: Emergency Medicine

## 2014-04-20 ENCOUNTER — Encounter (HOSPITAL_COMMUNITY): Payer: Self-pay | Admitting: Emergency Medicine

## 2014-04-20 DIAGNOSIS — Y9289 Other specified places as the place of occurrence of the external cause: Secondary | ICD-10-CM | POA: Insufficient documentation

## 2014-04-20 DIAGNOSIS — F3289 Other specified depressive episodes: Secondary | ICD-10-CM | POA: Insufficient documentation

## 2014-04-20 DIAGNOSIS — IMO0001 Reserved for inherently not codable concepts without codable children: Secondary | ICD-10-CM | POA: Insufficient documentation

## 2014-04-20 DIAGNOSIS — Z8709 Personal history of other diseases of the respiratory system: Secondary | ICD-10-CM | POA: Diagnosis not present

## 2014-04-20 DIAGNOSIS — H9190 Unspecified hearing loss, unspecified ear: Secondary | ICD-10-CM | POA: Diagnosis not present

## 2014-04-20 DIAGNOSIS — F329 Major depressive disorder, single episode, unspecified: Secondary | ICD-10-CM | POA: Insufficient documentation

## 2014-04-20 DIAGNOSIS — S40269A Insect bite (nonvenomous) of unspecified shoulder, initial encounter: Secondary | ICD-10-CM | POA: Diagnosis not present

## 2014-04-20 DIAGNOSIS — Y9389 Activity, other specified: Secondary | ICD-10-CM | POA: Diagnosis not present

## 2014-04-20 DIAGNOSIS — IMO0002 Reserved for concepts with insufficient information to code with codable children: Secondary | ICD-10-CM | POA: Insufficient documentation

## 2014-04-20 DIAGNOSIS — Z862 Personal history of diseases of the blood and blood-forming organs and certain disorders involving the immune mechanism: Secondary | ICD-10-CM | POA: Insufficient documentation

## 2014-04-20 DIAGNOSIS — Z8639 Personal history of other endocrine, nutritional and metabolic disease: Secondary | ICD-10-CM | POA: Insufficient documentation

## 2014-04-20 DIAGNOSIS — W57XXXA Bitten or stung by nonvenomous insect and other nonvenomous arthropods, initial encounter: Secondary | ICD-10-CM | POA: Insufficient documentation

## 2014-04-20 MED ORDER — CEPHALEXIN 500 MG PO CAPS: 500.0000 mg | ORAL_CAPSULE | Freq: Four times a day (QID) | ORAL | Status: DC

## 2014-04-20 MED ORDER — EPINEPHRINE 0.3 MG/0.3ML IJ SOAJ
0.3000 mg | Freq: Once | INTRAMUSCULAR | Status: AC | PRN
Start: 2014-04-20 — End: ?

## 2014-04-20 MED ORDER — FLUCONAZOLE 150 MG PO TABS: 150.0000 mg | ORAL_TABLET | Freq: Once | ORAL | Status: DC

## 2014-04-20 NOTE — ED Notes (Signed)
Pt reports she was bit by something yesterday while mowing. sts it doesn't feel like mosquito bite. 1 bite to back and 1 to L arm. Pt reports that the bites change from white to red.

## 2014-04-20 NOTE — ED Provider Notes (Signed)
CSN: [id number]     Arrival date & time 04/20/14  0207 History   First MD Initiated Contact with Patient 04/20/14 [phone]     Chief Complaint  Patient presents with  . Insect Bite     (Consider location/radiation/quality/duration/timing/severity/associated sxs/prior Treatment) HPI Comments: Patient is a 53 year old female who presents to the emergency department for further evaluation of bug bites. Patient states that she was mowing the lawn yesterday when she felt a stinging sensation to her posterior left shoulder and left upper arm. Patient states that discomfort has been aching and fairly constant since onset. She states that she applied witch hazel to areas of discomfort with no significant improvement. Patient states that since the bite she has noticed worsening swelling and redness to the area. She did not see a specific bug bite her. She denies any known tick bites. He should further denies associated fever, lip or tongue swelling, inability to swallow or drooling, shortness of breath, numbness/tingling, and extremity weakness.  The history is provided by the patient. No language interpreter was used.    Past Medical History  Diagnosis Date  . Hyperlipidemia   . Depression   . Hearing impaired   . Chronic bronchitis     Dr. Maple Hudson  . Neuropathy   . 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 Neck surgery    . Inner ear surgery      cochlear implant - left   Family History  Problem Relation Age of Onset  . Alzheimer's disease Mother   . Alzheimer's disease Father    History  Substance Use Topics  . Smoking status: Never Smoker   . Smokeless tobacco: Not on file  . Alcohol Use: Yes     Comment: occas   OB History   Grav Para Term Preterm Abortions TAB SAB Ect Mult Living                  Review of Systems  Musculoskeletal: Positive for myalgias.  Skin: Positive for color change and rash.  All other systems  reviewed and are negative.    Allergies  Compazine; Fexofenadine; Midol; Naproxen sodium; Prochlorperazine edisylate; and Zolpidem tartrate  Home Medications   Prior to Admission medications   Medication Sig Start Date End Date Taking? Authorizing Provider  ciprofloxacin (CIPRO) 500 MG tablet Take 500 mg by mouth 1 day or 1 dose.   Yes Elias Else, MD  DULoxetine (CYMBALTA) 30 MG capsule Take 30 mg by mouth daily.   Yes Historical Provider, MD  ibuprofen (ADVIL,MOTRIN) 400 MG tablet Take 400 mg by mouth every 6 (six) hours as needed for pain.   Yes Historical Provider, MD  cephALEXin (KEFLEX) 500 MG capsule Take 1 capsule (500 mg total) by mouth 4 (four) times daily. 04/20/14   Antony Madura, PA-C  EPINEPHrine (EPIPEN 2-PAK) 0.3 mg/0.3 mL IJ SOAJ injection Inject 0.3 mLs (0.3 mg total) into the muscle once as needed (for severe allergic reaction). CAll 911 immediately if you have to use this medicine 04/20/14   Antony Madura, PA-C  fluconazole (DIFLUCAN) 150 MG tablet Take 1 tablet (150 mg total) by mouth once. 04/20/14   Antony Madura, PA-C  Lorcaserin HCl (BELVIQ) 10 MG TABS Take by mouth 2 (two) times daily.    Historical Provider, MD  predniSONE (DELTASONE) 10 MG tablet Take 1 tablet (10 mg total) by mouth daily. 02/13/13   Melvyn Novas, MD  BP 114/70  Pulse 80  Temp(Src) 98.2 F (36.8 C) (Oral)  Resp 16  Wt 221 lb 2 oz (100.302 kg)  SpO2 98%  Physical Exam  Nursing note and vitals reviewed. Constitutional: She is oriented to person, place, and time. She appears well-developed and well-nourished. No distress.  Nontoxic/nonseptic appearing  HENT:  Head: Normocephalic and atraumatic.  Mouth/Throat: Oropharynx is clear and moist. No oropharyngeal exudate.  No angioedema. Patient tolerating secretions without difficulty.  Eyes: Conjunctivae and EOM are normal. No scleral icterus.  Neck: Normal range of motion.  Cardiovascular: Normal rate, regular rhythm and intact distal pulses.    Distal radial pulse 2+ in left upper extremity.  Pulmonary/Chest: Effort normal. No respiratory distress. She has no wheezes.  Chest expansion symmetric. No wheezing or audible stridor.  Musculoskeletal: Normal range of motion.  Neurological: She is alert and oriented to person, place, and time. She exhibits normal muscle tone. Coordination normal.  GCS 15. Patient moves extremities without ataxia. No gross sensory deficits appreciated.  Skin: Skin is warm and dry. Rash noted. She is not diaphoretic. No erythema. No pallor.  Patient with 2 mildly erythematous, slightly raised and indurated macules. First appreciated to posterior left shoulder just posterior to left posterior axillary line and measuring 3 cm in diameter. Second macule noted to posterior mid left upper extremity. Areas are pruritic and with mild heat to touch. No redness no streaking. No area of fluctuance. No weeping, purulence, or skin peeling.  Psychiatric: She has a normal mood and affect. Her behavior is normal.    ED Course  Procedures (including critical care time) Labs Review Labs Reviewed - No data to display  Imaging Review No results found.   EKG Interpretation None      MDM   Final diagnoses:  Bug bite    53 year old female presents to the emergency department for uncomplicated bug bite. Patient did not visualize any specific bug bite her. Bite occurred yesterday while mowing the lawn. Patient neurovascularly intact without sensory deficits. No evidence of respiratory compromise. No angioedema. Patient tolerating secretions without difficulty. Physical exam c/w localized skin reaction vs early cellulitis. Exam more favorable for localized skin reaction, but will cover with Keflex. Have advised PCP f/u. Return precautions discussed and provided. Patient agreeable to plan with no unaddressed concerns.   Filed Vitals:   04/20/14 0212 04/20/14 0430  BP: 145/77 114/70  Pulse: 88 80  Temp: 98.1 F (36.7 C)  98.2 F (36.8 C)  TempSrc: Oral   Resp: 18 16  Weight: 221 lb 2 oz (100.302 kg)   SpO2: 94% 98%       Antony Madura, PA-C 04/20/14 [phone]

## 2014-04-20 NOTE — Discharge Instructions (Signed)
Recommend cool compresses to the area and Tylenol or ibuprofen for discomfort. Keep the area covered if desired to prevent further irritation and to prevent scratching. Take Keflex as prescribed for infection. This will likely also treat your urinary tract infection. Talk with your doctor about discontinuing ciprofloxacin for this reason. Take Diflucan as needed if you develop a yeast infection. Use an EpiPen if you develop throat or tongue swelling, inability to swallow, or shortness of breath. Return to the ED if symptoms worsen.  Insect Bite Mosquitoes, flies, fleas, bedbugs, and many other insects can bite. Insect bites are different from insect stings. A sting is when venom is injected into the skin. Some insect bites can transmit infectious diseases. SYMPTOMS  Insect bites usually turn red, swell, and itch for 2 to 4 days. They often go away on their own. TREATMENT  Your caregiver may prescribe antibiotic medicines if a bacterial infection develops in the bite. HOME CARE INSTRUCTIONS  Do not scratch the bite area.  Keep the bite area clean and dry. Wash the bite area thoroughly with soap and water.  Put ice or cool compresses on the bite area.  Put ice in a plastic bag.  Place a towel between your skin and the bag.  Leave the ice on for 20 minutes, 4 times a day for the first 2 to 3 days, or as directed.  You may apply a baking soda paste, cortisone cream, or calamine lotion to the bite area as directed by your caregiver. This can help reduce itching and swelling.  Only take over-the-counter or prescription medicines as directed by your caregiver.  If you are given antibiotics, take them as directed. Finish them even if you start to feel better. You may need a tetanus shot if:  You cannot remember when you had your last tetanus shot.  You have never had a tetanus shot.  The injury broke your skin. If you get a tetanus shot, your arm may swell, get red, and feel warm to the  touch. This is common and not a problem. If you need a tetanus shot and you choose not to have one, there is a rare chance of getting tetanus. Sickness from tetanus can be serious. SEEK IMMEDIATE MEDICAL CARE IF:   You have increased pain, redness, or swelling in the bite area.  You see a red line on the skin coming from the bite.  You have a fever.  You have joint pain.  You have a headache or neck pain.  You have unusual weakness.  You have a rash.  You have chest pain or shortness of breath.  You have abdominal pain, nausea, or vomiting.  You feel unusually tired or sleepy. MAKE SURE YOU:   Understand these instructions.  Will watch your condition.  Will get help right away if you are not doing well or get worse. Document Released: 09/27/2004 Document Revised: 11/12/2011 Document Reviewed: 03/21/2011 [facility] Patient Information 2015 [place], [place]. This information is not intended to replace advice given to you by your health care provider. Make sure you discuss any questions you have with your health care provider.

## 2014-04-20 NOTE — ED Provider Notes (Signed)
Medical screening examination/treatment/procedure(s) were performed by non-physician practitioner and as supervising physician I was immediately available for consultation/collaboration.   EKG Interpretation None       Ahlijah Raia M Genna Casimir, MD 04/20/14 0631 

## 2014-08-17 ENCOUNTER — Encounter (HOSPITAL_COMMUNITY): Payer: Self-pay | Admitting: Emergency Medicine

## 2014-08-17 ENCOUNTER — Emergency Department (HOSPITAL_COMMUNITY)
Admission: EM | Admit: 2014-08-17 | Discharge: 2014-08-17 | Disposition: A | Discharge status: Home / Self Care | Payer: Federal, State, Local not specified - PPO | Source: Home / Self Care | Attending: Family Medicine | Admitting: Family Medicine

## 2014-08-17 DIAGNOSIS — R55 Syncope and collapse: Secondary | ICD-10-CM

## 2014-08-17 DIAGNOSIS — S61219A Laceration without foreign body of unspecified finger without damage to nail, initial encounter: Secondary | ICD-10-CM

## 2014-08-17 DIAGNOSIS — Z23 Encounter for immunization: Secondary | ICD-10-CM

## 2014-08-17 MED ORDER — TETANUS-DIPHTH-ACELL PERTUSSIS 5-2.5-18.5 LF-MCG/0.5 IM SUSP
0.5000 mL | Freq: Once | INTRAMUSCULAR | Status: AC
  Administered 2014-08-17: 0.5 mL via INTRAMUSCULAR

## 2014-08-17 MED ORDER — TETANUS-DIPHTH-ACELL PERTUSSIS 5-2.5-18.5 LF-MCG/0.5 IM SUSP
INTRAMUSCULAR | Status: AC
  Filled 2014-08-17: qty 0.5

## 2014-08-17 MED ORDER — CEPHALEXIN 500 MG PO CAPS: 500.0000 mg | ORAL_CAPSULE | Freq: Three times a day (TID) | ORAL | Status: DC

## 2014-08-17 MED ORDER — LIDOCAINE HCL (PF) 2 % IJ SOLN
INTRAMUSCULAR | Status: AC
  Filled 2014-08-17: qty 2

## 2014-08-17 MED ORDER — FLUCONAZOLE 150 MG PO TABS: 150.0000 mg | ORAL_TABLET | Freq: Once | ORAL | Status: DC

## 2014-08-17 MED ORDER — LIDOCAINE HCL (PF) 1 % IJ SOLN
INTRAMUSCULAR | Status: AC
  Filled 2014-08-17: qty 5

## 2014-08-17 NOTE — ED Notes (Signed)
Patient c/o cut to her left index finger today. Patient reports she was slicing meat and accidentally cut her finger. Patient does not know when her last tetanus shot was. NAD.

## 2014-08-17 NOTE — ED Provider Notes (Signed)
Rhonda Simmons is a 53 y.o. female who presents to Urgent Care today for left index finger laceration. Patient cut her left index finger today at home while cutting meat out of the package. She notes bleeding and pain. No radiating pain weakness or numbness. She cannot recall her last tetanus vaccination.   Past Medical History  Diagnosis Date  . Hyperlipidemia   . Depression   . Hearing impaired   . Chronic bronchitis     Dr. Maple Hudson  . Neuropathy   . 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 Neck surgery    . Inner ear surgery      cochlear implant - left   History  Substance Use Topics  . Smoking status: Never Smoker   . Smokeless tobacco: Not on file  . Alcohol Use: Yes     Comment: occas   ROS as above Medications: Current Facility-Administered Medications  Medication Dose Route Frequency Provider Last Rate Last Dose  . Tdap (BOOSTRIX) injection 0.5 mL  0.5 mL Intramuscular Once Rodolph Bong, MD       Current Outpatient Prescriptions  Medication Sig Dispense Refill  . cephALEXin (KEFLEX) 500 MG capsule Take 1 capsule (500 mg total) by mouth 3 (three) times daily. 21 capsule 0  . DULoxetine (CYMBALTA) 30 MG capsule Take 30 mg by mouth daily.    Marland Kitchen EPINEPHrine (EPIPEN 2-PAK) 0.3 mg/0.3 mL IJ SOAJ injection Inject 0.3 mLs (0.3 mg total) into the muscle once as needed (for severe allergic reaction). CAll 911 immediately if you have to use this medicine 1 Device 0  . fluconazole (DIFLUCAN) 150 MG tablet Take 1 tablet (150 mg total) by mouth once. 1 tablet 1  . ibuprofen (ADVIL,MOTRIN) 400 MG tablet Take 400 mg by mouth every 6 (six) hours as needed for pain.    . Lorcaserin HCl (BELVIQ) 10 MG TABS Take by mouth 2 (two) times daily.    . predniSONE (DELTASONE) 10 MG tablet Take 1 tablet (10 mg total) by mouth daily. 21 tablet 0   Allergies  Allergen Reactions  . Compazine     convulsions  . Fexofenadine     D version  makes headaches worse  . Midol [Aspirin-Cinnamedrine-Caffeine]     unknown  . Naproxen Sodium     unknown  . Prochlorperazine Edisylate     unknown  . Zolpidem Tartrate     hallucinations     Exam:  BP 142/92 mmHg  Pulse 101  Temp(Src) 98.7 F (37.1 C) (Oral)  Resp 16  SpO2 95% Gen: Well NAD Left index finger: 1 cm laceration at the tip of the finger. Laceration extends to the dermis but does not involve any significant deep structures. Motion is intact pulses capillary refill and sensation are intact distally.   Laceration repair: Consent obtained and timeout performed. 2 mL of 1% lidocaine without epinephrine injected achieving good anesthesia. Wound copiously irrigated with sterile water. Betadine used to prep the wound and the wound was draped in the usual sterile fashion. 1 horizontal mattress suture and one simple and her sutures were used to close the wound.  5-0 Ethilon was used. After the last suture was placed patient felt lightheaded and hot and nauseated. She was placed into Trendelenburg position. Her blood pressure at this time was 124 systolic. Her heart rate was 100, and her oxygen saturation was 100% on room air. After 5 minutes  she felt normal and was able to eat and drink and set up. She denies any chest pain during this event. During the entire time she was awake and alert and able to answer questions.  Patient was given a Tdapvaccine prior to discharge  No results found for this or any previous visit (from the past 24 hour(s)). No results found.  Assessment and Plan: 53 y.o. female with  1) left fingertip laceration. 2 sutures placed. Patient treated with Keflex. Additionally use fluconazole for possible yeast infection. 2) vasovagal near syncope resolved   Discussed warning signs or symptoms. Please see discharge instructions. Patient expresses understanding.     Rodolph Bong, MD 08/17/14 [phone]

## 2014-08-17 NOTE — Discharge Instructions (Signed)
Thank you for coming in today. Take Keflex 3 times daily for one week. Use fluconazole once for yeast infection as needed. Return in one week for suture removal. Come back as needed. Call or go to the emergency room if you get worse, have trouble breathing, have chest pains, or palpitations.   Laceration Care, Adult A laceration is a cut or lesion that goes through all layers of the skin and into the tissue just beneath the skin. TREATMENT  Some lacerations may not require closure. Some lacerations may not be able to be closed due to an increased risk of infection. It is important to see your caregiver as soon as possible after an injury to minimize the risk of infection and maximize the opportunity for successful closure. If closure is appropriate, pain medicines may be given, if needed. The wound will be cleaned to help prevent infection. Your caregiver will use stitches (sutures), staples, wound glue (adhesive), or skin adhesive strips to repair the laceration. These tools bring the skin edges together to allow for faster healing and a better cosmetic outcome. However, all wounds will heal with a scar. Once the wound has healed, scarring can be minimized by covering the wound with sunscreen during the day for 1 full year. HOME CARE INSTRUCTIONS  For sutures or staples:  Keep the wound clean and dry.  If you were given a bandage (dressing), you should change it at least once a day. Also, change the dressing if it becomes wet or dirty, or as directed by your caregiver.  Wash the wound with soap and water 2 times a day. Rinse the wound off with water to remove all soap. Pat the wound dry with a clean towel.  After cleaning, apply a thin layer of the antibiotic ointment as recommended by your caregiver. This will help prevent infection and keep the dressing from sticking.  You may shower as usual after the first 24 hours. Do not soak the wound in water until the sutures are removed.  Only  take over-the-counter or prescription medicines for pain, discomfort, or fever as directed by your caregiver.  Get your sutures or staples removed as directed by your caregiver. For skin adhesive strips:  Keep the wound clean and dry.  Do not get the skin adhesive strips wet. You may bathe carefully, using caution to keep the wound dry.  If the wound gets wet, pat it dry with a clean towel.  Skin adhesive strips will fall off on their own. You may trim the strips as the wound heals. Do not remove skin adhesive strips that are still stuck to the wound. They will fall off in time. For wound adhesive:  You may briefly wet your wound in the shower or bath. Do not soak or scrub the wound. Do not swim. Avoid periods of heavy perspiration until the skin adhesive has fallen off on its own. After showering or bathing, gently pat the wound dry with a clean towel.  Do not apply liquid medicine, cream medicine, or ointment medicine to your wound while the skin adhesive is in place. This may loosen the film before your wound is healed.  If a dressing is placed over the wound, be careful not to apply tape directly over the skin adhesive. This may cause the adhesive to be pulled off before the wound is healed.  Avoid prolonged exposure to sunlight or tanning lamps while the skin adhesive is in place. Exposure to ultraviolet light in the first year will darken  the scar.  The skin adhesive will usually remain in place for 5 to 10 days, then naturally fall off the skin. Do not pick at the adhesive film. You may need a tetanus shot if:  You cannot remember when you had your last tetanus shot.  You have never had a tetanus shot. If you get a tetanus shot, your arm may swell, get red, and feel warm to the touch. This is common and not a problem. If you need a tetanus shot and you choose not to have one, there is a rare chance of getting tetanus. Sickness from tetanus can be serious. SEEK MEDICAL CARE IF:    You have redness, swelling, or increasing pain in the wound.  You see a red line that goes away from the wound.  You have yellowish-white fluid (pus) coming from the wound.  You have a fever.  You notice a bad smell coming from the wound or dressing.  Your wound breaks open before or after sutures have been removed.  You notice something coming out of the wound such as wood or glass.  Your wound is on your hand or foot and you cannot move a finger or toe. SEEK IMMEDIATE MEDICAL CARE IF:   Your pain is not controlled with prescribed medicine.  You have severe swelling around the wound causing pain and numbness or a change in color in your arm, hand, leg, or foot.  Your wound splits open and starts bleeding.  You have worsening numbness, weakness, or loss of function of any joint around or beyond the wound.  You develop painful lumps near the wound or on the skin anywhere on your body. MAKE SURE YOU:   Understand these instructions.  Will watch your condition.  Will get help right away if you are not doing well or get worse. Document Released: 08/20/2005 Document Revised: 11/12/2011 Document Reviewed: 02/13/2011 [facility] Patient Information 2015 [place], [place]. This information is not intended to replace advice given to you by your health care provider. Make sure you discuss any questions you have with your health care provider.   Vasovagal Syncope, Adult Syncope, commonly known as fainting, is a temporary loss of consciousness. It occurs when the blood flow to the brain is reduced. Vasovagal syncope (also called neurocardiogenic syncope) is a fainting spell in which the blood flow to the brain is reduced because of a sudden drop in heart rate and blood pressure. Vasovagal syncope occurs when the brain and the cardiovascular system (blood vessels) do not adequately communicate and respond to each other. This is the most common cause of fainting. It often occurs in response to  fear or some other type of emotional or physical stress. The body has a reaction in which the heart starts beating too slowly or the blood vessels expand, reducing blood pressure. This type of fainting spell is generally considered harmless. However, injuries can occur if a person takes a sudden fall during a fainting spell.  CAUSES  Vasovagal syncope occurs when a person's blood pressure and heart rate decrease suddenly, usually in response to a trigger. Many things and situations can trigger an episode. Some of these include:   Pain.   Fear.   The sight of blood or medical procedures, such as blood being drawn from a vein.   Common activities, such as coughing, swallowing, stretching, or going to the bathroom.   Emotional stress.   Prolonged standing, especially in a warm environment.   Lack of sleep or rest.  Prolonged lack of food.   Prolonged lack of fluids.   Recent illness.  The use of certain drugs that affect blood pressure, such as cocaine, alcohol, marijuana, inhalants, and opiates.  SYMPTOMS  Before the fainting episode, you may:   Feel dizzy or light headed.   Become pale.  Sense that you are going to faint.   Feel like the room is spinning.   Have tunnel vision, only seeing directly in front of you.   Feel sick to your stomach (nauseous).   See spots or slowly lose vision.   Hear ringing in your ears.   Have a headache.   Feel warm and sweaty.   Feel a sensation of pins and needles. During the fainting spell, you will generally be unconscious for no longer than a couple minutes before waking up and returning to normal. If you get up too quickly before your body can recover, you may faint again. Some twitching or jerky movements may occur during the fainting spell.  DIAGNOSIS  Your caregiver will ask about your symptoms, take a medical history, and perform a physical exam. Various tests may be done to rule out other causes of  fainting. These may include blood tests and tests to check the heart, such as electrocardiography, echocardiography, and possibly an electrophysiology study. When other causes have been ruled out, a test may be done to check the body's response to changes in position (tilt table test). TREATMENT  Most cases of vasovagal syncope do not require treatment. Your caregiver may recommend ways to avoid fainting triggers and may provide home strategies for preventing fainting. If you must be exposed to a possible trigger, you can drink additional fluids to help reduce your chances of having an episode of vasovagal syncope. If you have warning signs of an oncoming episode, you can respond by positioning yourself favorably (lying down). If your fainting spells continue, you may be given medicines to prevent fainting. Some medicines may help make you more resistant to repeated episodes of vasovagal syncope. Special exercises or compression stockings may be recommended. In rare cases, the surgical placement of a pacemaker is considered. HOME CARE INSTRUCTIONS   Learn to identify the warning signs of vasovagal syncope.   Sit or lie down at the first warning sign of a fainting spell. If sitting, put your head down between your legs. If you lie down, swing your legs up in the air to increase blood flow to the brain.   Avoid hot tubs and saunas.  Avoid prolonged standing.  Drink enough fluids to keep your urine clear or pale yellow. Avoid caffeine.  Increase salt in your diet as directed by your caregiver.   If you have to stand for a long time, perform movements such as:   Crossing your legs.   Flexing and stretching your leg muscles.   Squatting.   Moving your legs.   Bending over.   Only take over-the-counter or prescription medicines as directed by your caregiver. Do not suddenly stop any medicines without asking your caregiver first. SEEK MEDICAL CARE IF:   Your fainting spells  continue or happen more frequently in spite of treatment.   You lose consciousness for more than a couple minutes.  You have fainting spells during or after exercising or after being startled.   You have new symptoms that occur with the fainting spells, such as:   Shortness of breath.  Chest pain.   Irregular heartbeat.   You have episodes of twitching or jerky  movements that last longer than a few seconds.  You have episodes of twitching or jerky movements without obvious fainting. SEEK IMMEDIATE MEDICAL CARE IF:   You have injuries or bleeding after a fainting spell.   You have episodes of twitching or jerky movements that last longer than 5 minutes.   You have more than one spell of twitching or jerky movements before returning to consciousness after fainting. MAKE SURE YOU:   Understand these instructions.  Will watch your condition.  Will get help right away if you are not doing well or get worse. Document Released: 08/06/2012 Document Reviewed: 08/06/2012 [facility] Patient Information 2015 [place], [place]. This information is not intended to replace advice given to you by your health care provider. Make sure you discuss any questions you have with your health care provider.

## 2014-08-24 ENCOUNTER — Other Ambulatory Visit: Payer: Self-pay | Admitting: Family Medicine

## 2014-08-24 ENCOUNTER — Encounter (HOSPITAL_COMMUNITY): Payer: Self-pay | Admitting: *Deleted

## 2014-08-24 ENCOUNTER — Ambulatory Visit
Admission: RE | Admit: 2014-08-24 | Discharge: 2014-08-24 | Disposition: A | Discharge status: Home / Self Care | Payer: Federal, State, Local not specified - PPO | Source: Ambulatory Visit | Attending: Family Medicine | Admitting: Family Medicine

## 2014-08-24 ENCOUNTER — Emergency Department (INDEPENDENT_AMBULATORY_CARE_PROVIDER_SITE_OTHER)
Admission: EM | Admit: 2014-08-24 | Discharge: 2014-08-24 | Disposition: A | Discharge status: Home / Self Care | Payer: Federal, State, Local not specified - PPO | Source: Home / Self Care | Attending: Family Medicine | Admitting: Family Medicine

## 2014-08-24 DIAGNOSIS — J069 Acute upper respiratory infection, unspecified: Secondary | ICD-10-CM

## 2014-08-24 DIAGNOSIS — Z4802 Encounter for removal of sutures: Secondary | ICD-10-CM

## 2014-08-24 NOTE — ED Provider Notes (Signed)
CSN: [id number]     Arrival date & time 08/24/14  1047 History   First MD Initiated Contact with Patient 08/24/14 1116     No chief complaint on file.  (Consider location/radiation/quality/duration/timing/severity/associated sxs/prior Treatment) Patient is a 53 y.o. female presenting with suture removal. The history is provided by the patient.  Suture / Staple Removal This is a new problem. The current episode started more than 1 week ago. The problem has been gradually improving.    Past Medical History  Diagnosis Date  . Hyperlipidemia   . Depression   . Hearing impaired   . Chronic bronchitis     Dr. Maple Hudson  . Neuropathy   . 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 Neck surgery    . Inner ear surgery      cochlear implant - left   Family History  Problem Relation Age of Onset  . Alzheimer's disease Mother   . Alzheimer's disease Father    History  Substance Use Topics  . Smoking status: Never Smoker   . Smokeless tobacco: Not on file  . Alcohol Use: Yes     Comment: occas   OB History    No data available     Review of Systems  Constitutional: Negative.   Musculoskeletal: Negative.   Skin: Positive for wound.    Allergies  Compazine; Fexofenadine; Midol; Naproxen sodium; Prochlorperazine edisylate; and Zolpidem tartrate  Home Medications   Prior to Admission medications   Medication Sig Start Date End Date Taking? Authorizing Provider  cephALEXin (KEFLEX) 500 MG capsule Take 1 capsule (500 mg total) by mouth 3 (three) times daily. 08/17/14   Rodolph Bong, MD  DULoxetine (CYMBALTA) 30 MG capsule Take 30 mg by mouth daily.    Historical Provider, MD  EPINEPHrine (EPIPEN 2-PAK) 0.3 mg/0.3 mL IJ SOAJ injection Inject 0.3 mLs (0.3 mg total) into the muscle once as needed (for severe allergic reaction). CAll 911 immediately if you have to use this medicine 04/20/14   Antony Madura, PA-C  fluconazole  (DIFLUCAN) 150 MG tablet Take 1 tablet (150 mg total) by mouth once. 08/17/14   Rodolph Bong, MD  ibuprofen (ADVIL,MOTRIN) 400 MG tablet Take 400 mg by mouth every 6 (six) hours as needed for pain.    Historical Provider, MD  Lorcaserin HCl (BELVIQ) 10 MG TABS Take by mouth 2 (two) times daily.    Historical Provider, MD  predniSONE (DELTASONE) 10 MG tablet Take 1 tablet (10 mg total) by mouth daily. 02/13/13   Melvyn Novas, MD   There were no vitals taken for this visit. Physical Exam  Constitutional: She is oriented to person, place, and time. She appears well-developed and well-nourished.  Neurological: She is alert and oriented to person, place, and time.  Skin: Skin is warm and dry.  lif lac healed, no problems, sutures removed.  Nursing note and vitals reviewed.   ED Course  Procedures (including critical care time) Labs Review Labs Reviewed - No data to display  Imaging Review No results found.   MDM   1. Encounter for removal of sutures        Linna Hoff, MD 08/24/14 1122

## 2014-08-24 NOTE — ED Notes (Signed)
  Pt   Here  For  Suture  Removal    Appears  Well  Healing

## 2014-08-24 NOTE — Discharge Instructions (Signed)
Return as needed

## 2015-06-01 ENCOUNTER — Other Ambulatory Visit: Payer: Self-pay | Admitting: Family Medicine

## 2015-06-01 DIAGNOSIS — R112 Nausea with vomiting, unspecified: Secondary | ICD-10-CM

## 2015-06-01 DIAGNOSIS — R1084 Generalized abdominal pain: Secondary | ICD-10-CM

## 2015-06-08 ENCOUNTER — Other Ambulatory Visit: Payer: Federal, State, Local not specified - PPO

## 2015-06-10 ENCOUNTER — Ambulatory Visit
Admission: RE | Admit: 2015-06-10 | Discharge: 2015-06-10 | Disposition: A | Discharge status: Home / Self Care | Payer: Federal, State, Local not specified - PPO | Source: Ambulatory Visit | Attending: Family Medicine | Admitting: Family Medicine

## 2015-06-10 DIAGNOSIS — R112 Nausea with vomiting, unspecified: Secondary | ICD-10-CM

## 2015-06-10 DIAGNOSIS — R1084 Generalized abdominal pain: Secondary | ICD-10-CM

## 2015-09-19 ENCOUNTER — Other Ambulatory Visit (HOSPITAL_COMMUNITY): Payer: Self-pay | Admitting: Surgery

## 2015-10-13 ENCOUNTER — Ambulatory Visit (HOSPITAL_COMMUNITY)
Admission: RE | Admit: 2015-10-13 | Discharge: 2015-10-13 | Disposition: A | Discharge status: Home / Self Care | Payer: Federal, State, Local not specified - PPO | Source: Ambulatory Visit | Attending: Surgery | Admitting: Surgery

## 2015-10-13 ENCOUNTER — Other Ambulatory Visit: Payer: Self-pay

## 2015-10-13 DIAGNOSIS — Z01818 Encounter for other preprocedural examination: Secondary | ICD-10-CM | POA: Diagnosis not present

## 2015-11-09 ENCOUNTER — Ambulatory Visit: Payer: Federal, State, Local not specified - PPO | Admitting: Dietician

## 2015-12-13 DIAGNOSIS — J029 Acute pharyngitis, unspecified: Secondary | ICD-10-CM | POA: Diagnosis not present

## 2015-12-19 DIAGNOSIS — M545 Low back pain: Secondary | ICD-10-CM | POA: Diagnosis not present

## 2015-12-19 DIAGNOSIS — J069 Acute upper respiratory infection, unspecified: Secondary | ICD-10-CM | POA: Diagnosis not present

## 2015-12-23 DIAGNOSIS — H9041 Sensorineural hearing loss, unilateral, right ear, with unrestricted hearing on the contralateral side: Secondary | ICD-10-CM | POA: Diagnosis not present

## 2016-01-02 HISTORY — PX: COCHLEAR IMPLANT REVISION: SHX1366

## 2016-01-17 DIAGNOSIS — M94262 Chondromalacia, left knee: Secondary | ICD-10-CM | POA: Diagnosis not present

## 2016-01-17 DIAGNOSIS — M94261 Chondromalacia, right knee: Secondary | ICD-10-CM | POA: Diagnosis not present

## 2016-01-31 DIAGNOSIS — E669 Obesity, unspecified: Secondary | ICD-10-CM | POA: Diagnosis not present

## 2016-01-31 DIAGNOSIS — Z6841 Body Mass Index (BMI) 40.0 and over, adult: Secondary | ICD-10-CM | POA: Diagnosis not present

## 2016-01-31 DIAGNOSIS — N951 Menopausal and female climacteric states: Secondary | ICD-10-CM | POA: Diagnosis not present

## 2016-02-06 DIAGNOSIS — M25561 Pain in right knee: Secondary | ICD-10-CM | POA: Diagnosis not present

## 2016-02-06 DIAGNOSIS — M25562 Pain in left knee: Secondary | ICD-10-CM | POA: Diagnosis not present

## 2016-02-16 DIAGNOSIS — H903 Sensorineural hearing loss, bilateral: Secondary | ICD-10-CM | POA: Diagnosis not present

## 2016-03-14 DIAGNOSIS — K8020 Calculus of gallbladder without cholecystitis without obstruction: Secondary | ICD-10-CM | POA: Diagnosis not present

## 2016-03-19 ENCOUNTER — Ambulatory Visit
Admission: RE | Admit: 2016-03-19 | Discharge: 2016-03-19 | Disposition: A | Discharge status: Home / Self Care | Payer: Federal, State, Local not specified - PPO | Source: Ambulatory Visit | Attending: Surgery | Admitting: Surgery

## 2016-03-19 ENCOUNTER — Other Ambulatory Visit: Payer: Self-pay | Admitting: Surgery

## 2016-03-19 DIAGNOSIS — R1084 Generalized abdominal pain: Secondary | ICD-10-CM

## 2016-03-19 DIAGNOSIS — K5730 Diverticulosis of large intestine without perforation or abscess without bleeding: Secondary | ICD-10-CM | POA: Diagnosis not present

## 2016-03-19 MED ORDER — IOPAMIDOL (ISOVUE-300) INJECTION 61%
125.0000 mL | Freq: Once | INTRAVENOUS | Status: AC | PRN
  Administered 2016-03-19: 125 mL via INTRAVENOUS

## 2016-03-21 ENCOUNTER — Other Ambulatory Visit: Payer: Federal, State, Local not specified - PPO

## 2016-03-23 ENCOUNTER — Other Ambulatory Visit: Payer: Federal, State, Local not specified - PPO

## 2016-03-27 DIAGNOSIS — M545 Low back pain: Secondary | ICD-10-CM | POA: Diagnosis not present

## 2016-03-27 DIAGNOSIS — K8020 Calculus of gallbladder without cholecystitis without obstruction: Secondary | ICD-10-CM | POA: Diagnosis not present

## 2016-03-29 DIAGNOSIS — N951 Menopausal and female climacteric states: Secondary | ICD-10-CM | POA: Diagnosis not present

## 2016-03-29 DIAGNOSIS — E669 Obesity, unspecified: Secondary | ICD-10-CM | POA: Diagnosis not present

## 2016-03-29 DIAGNOSIS — Z6841 Body Mass Index (BMI) 40.0 and over, adult: Secondary | ICD-10-CM | POA: Diagnosis not present

## 2016-04-04 DIAGNOSIS — K8020 Calculus of gallbladder without cholecystitis without obstruction: Secondary | ICD-10-CM | POA: Diagnosis not present

## 2016-04-06 DIAGNOSIS — H903 Sensorineural hearing loss, bilateral: Secondary | ICD-10-CM | POA: Diagnosis not present

## 2016-04-06 DIAGNOSIS — Z461 Encounter for fitting and adjustment of hearing aid: Secondary | ICD-10-CM | POA: Diagnosis not present

## 2016-04-21 DIAGNOSIS — M216X1 Other acquired deformities of right foot: Secondary | ICD-10-CM | POA: Diagnosis not present

## 2016-04-21 DIAGNOSIS — M25562 Pain in left knee: Secondary | ICD-10-CM | POA: Diagnosis not present

## 2016-04-21 DIAGNOSIS — M545 Low back pain: Secondary | ICD-10-CM | POA: Diagnosis not present

## 2016-04-21 DIAGNOSIS — S83207A Unspecified tear of unspecified meniscus, current injury, left knee, initial encounter: Secondary | ICD-10-CM | POA: Diagnosis not present

## 2016-05-01 DIAGNOSIS — H04123 Dry eye syndrome of bilateral lacrimal glands: Secondary | ICD-10-CM | POA: Diagnosis not present

## 2016-05-01 DIAGNOSIS — H2513 Age-related nuclear cataract, bilateral: Secondary | ICD-10-CM | POA: Diagnosis not present

## 2016-05-01 DIAGNOSIS — H40013 Open angle with borderline findings, low risk, bilateral: Secondary | ICD-10-CM | POA: Diagnosis not present

## 2016-05-29 DIAGNOSIS — N390 Urinary tract infection, site not specified: Secondary | ICD-10-CM | POA: Diagnosis not present

## 2016-05-29 DIAGNOSIS — N951 Menopausal and female climacteric states: Secondary | ICD-10-CM | POA: Diagnosis not present

## 2016-05-29 DIAGNOSIS — L723 Sebaceous cyst: Secondary | ICD-10-CM | POA: Diagnosis not present

## 2016-06-01 DIAGNOSIS — M5441 Lumbago with sciatica, right side: Secondary | ICD-10-CM | POA: Diagnosis not present

## 2016-06-01 DIAGNOSIS — M545 Low back pain: Secondary | ICD-10-CM | POA: Diagnosis not present

## 2016-06-08 DIAGNOSIS — Z6841 Body Mass Index (BMI) 40.0 and over, adult: Secondary | ICD-10-CM | POA: Diagnosis not present

## 2016-06-08 DIAGNOSIS — H9209 Otalgia, unspecified ear: Secondary | ICD-10-CM | POA: Insufficient documentation

## 2016-06-08 DIAGNOSIS — H9202 Otalgia, left ear: Secondary | ICD-10-CM | POA: Diagnosis not present

## 2016-06-28 ENCOUNTER — Other Ambulatory Visit: Payer: Self-pay | Admitting: Obstetrics and Gynecology

## 2016-06-28 DIAGNOSIS — N644 Mastodynia: Secondary | ICD-10-CM | POA: Diagnosis not present

## 2016-06-28 DIAGNOSIS — K582 Mixed irritable bowel syndrome: Secondary | ICD-10-CM | POA: Diagnosis not present

## 2016-06-28 DIAGNOSIS — N6001 Solitary cyst of right breast: Secondary | ICD-10-CM

## 2016-06-28 DIAGNOSIS — N6459 Other signs and symptoms in breast: Secondary | ICD-10-CM | POA: Diagnosis not present

## 2016-06-28 DIAGNOSIS — M545 Low back pain: Secondary | ICD-10-CM | POA: Diagnosis not present

## 2016-06-28 DIAGNOSIS — R5383 Other fatigue: Secondary | ICD-10-CM | POA: Diagnosis not present

## 2016-07-04 ENCOUNTER — Ambulatory Visit
Admission: RE | Admit: 2016-07-04 | Discharge: 2016-07-04 | Disposition: A | Discharge status: Home / Self Care | Payer: Federal, State, Local not specified - PPO | Source: Ambulatory Visit | Attending: Obstetrics and Gynecology | Admitting: Obstetrics and Gynecology

## 2016-07-04 DIAGNOSIS — N6001 Solitary cyst of right breast: Secondary | ICD-10-CM

## 2016-07-04 DIAGNOSIS — A09 Infectious gastroenteritis and colitis, unspecified: Secondary | ICD-10-CM | POA: Diagnosis not present

## 2016-07-04 DIAGNOSIS — N6320 Unspecified lump in the left breast, unspecified quadrant: Secondary | ICD-10-CM | POA: Diagnosis not present

## 2016-07-09 ENCOUNTER — Other Ambulatory Visit (HOSPITAL_COMMUNITY): Payer: Self-pay | Admitting: Obstetrics and Gynecology

## 2016-07-09 DIAGNOSIS — A09 Infectious gastroenteritis and colitis, unspecified: Secondary | ICD-10-CM | POA: Diagnosis not present

## 2016-07-09 DIAGNOSIS — R1032 Left lower quadrant pain: Secondary | ICD-10-CM

## 2016-07-09 DIAGNOSIS — R109 Unspecified abdominal pain: Secondary | ICD-10-CM | POA: Diagnosis not present

## 2016-07-09 DIAGNOSIS — Z01419 Encounter for gynecological examination (general) (routine) without abnormal findings: Secondary | ICD-10-CM | POA: Diagnosis not present

## 2016-07-09 DIAGNOSIS — Z6841 Body Mass Index (BMI) 40.0 and over, adult: Secondary | ICD-10-CM | POA: Diagnosis not present

## 2016-07-11 ENCOUNTER — Ambulatory Visit (HOSPITAL_COMMUNITY)
Admission: RE | Admit: 2016-07-11 | Discharge: 2016-07-11 | Disposition: A | Discharge status: Home / Self Care | Payer: Federal, State, Local not specified - PPO | Source: Ambulatory Visit | Attending: Obstetrics and Gynecology | Admitting: Obstetrics and Gynecology

## 2016-07-11 ENCOUNTER — Encounter (HOSPITAL_COMMUNITY): Payer: Self-pay

## 2016-07-11 ENCOUNTER — Encounter (INDEPENDENT_AMBULATORY_CARE_PROVIDER_SITE_OTHER): Payer: Self-pay

## 2016-07-11 DIAGNOSIS — K760 Fatty (change of) liver, not elsewhere classified: Secondary | ICD-10-CM | POA: Insufficient documentation

## 2016-07-11 DIAGNOSIS — K5730 Diverticulosis of large intestine without perforation or abscess without bleeding: Secondary | ICD-10-CM | POA: Diagnosis not present

## 2016-07-11 DIAGNOSIS — Z90710 Acquired absence of both cervix and uterus: Secondary | ICD-10-CM | POA: Diagnosis not present

## 2016-07-11 DIAGNOSIS — R1032 Left lower quadrant pain: Secondary | ICD-10-CM | POA: Diagnosis not present

## 2016-07-11 MED ORDER — IOPAMIDOL (ISOVUE-300) INJECTION 61%
100.0000 mL | Freq: Once | INTRAVENOUS | Status: AC | PRN
  Administered 2016-07-11: 100 mL via INTRAVENOUS

## 2016-07-18 DIAGNOSIS — K591 Functional diarrhea: Secondary | ICD-10-CM | POA: Diagnosis not present

## 2016-07-18 DIAGNOSIS — K760 Fatty (change of) liver, not elsewhere classified: Secondary | ICD-10-CM | POA: Diagnosis not present

## 2016-07-18 DIAGNOSIS — M545 Low back pain: Secondary | ICD-10-CM | POA: Diagnosis not present

## 2016-07-18 DIAGNOSIS — K8020 Calculus of gallbladder without cholecystitis without obstruction: Secondary | ICD-10-CM | POA: Diagnosis not present

## 2016-08-02 DIAGNOSIS — M5442 Lumbago with sciatica, left side: Secondary | ICD-10-CM | POA: Diagnosis not present

## 2016-08-02 DIAGNOSIS — M545 Low back pain: Secondary | ICD-10-CM | POA: Diagnosis not present

## 2016-08-21 DIAGNOSIS — R300 Dysuria: Secondary | ICD-10-CM | POA: Diagnosis not present

## 2016-09-24 DIAGNOSIS — J019 Acute sinusitis, unspecified: Secondary | ICD-10-CM | POA: Diagnosis not present

## 2016-09-28 DIAGNOSIS — J209 Acute bronchitis, unspecified: Secondary | ICD-10-CM | POA: Diagnosis not present

## 2017-01-11 DIAGNOSIS — F5101 Primary insomnia: Secondary | ICD-10-CM | POA: Diagnosis not present

## 2017-01-11 DIAGNOSIS — S76012A Strain of muscle, fascia and tendon of left hip, initial encounter: Secondary | ICD-10-CM | POA: Diagnosis not present

## 2017-01-29 DIAGNOSIS — M25552 Pain in left hip: Secondary | ICD-10-CM | POA: Diagnosis not present

## 2017-01-29 DIAGNOSIS — M7062 Trochanteric bursitis, left hip: Secondary | ICD-10-CM | POA: Diagnosis not present

## 2017-02-06 DIAGNOSIS — N390 Urinary tract infection, site not specified: Secondary | ICD-10-CM | POA: Diagnosis not present

## 2017-02-06 DIAGNOSIS — N760 Acute vaginitis: Secondary | ICD-10-CM | POA: Diagnosis not present

## 2017-02-06 DIAGNOSIS — N762 Acute vulvitis: Secondary | ICD-10-CM | POA: Diagnosis not present

## 2017-02-12 DIAGNOSIS — Z974 Presence of external hearing-aid: Secondary | ICD-10-CM | POA: Diagnosis not present

## 2017-02-12 DIAGNOSIS — H903 Sensorineural hearing loss, bilateral: Secondary | ICD-10-CM | POA: Diagnosis not present

## 2017-03-11 DIAGNOSIS — H04123 Dry eye syndrome of bilateral lacrimal glands: Secondary | ICD-10-CM | POA: Diagnosis not present

## 2017-03-11 DIAGNOSIS — H40013 Open angle with borderline findings, low risk, bilateral: Secondary | ICD-10-CM | POA: Diagnosis not present

## 2017-03-11 DIAGNOSIS — H2513 Age-related nuclear cataract, bilateral: Secondary | ICD-10-CM | POA: Diagnosis not present

## 2017-03-18 ENCOUNTER — Ambulatory Visit (INDEPENDENT_AMBULATORY_CARE_PROVIDER_SITE_OTHER): Payer: Federal, State, Local not specified - PPO | Admitting: Neurology

## 2017-03-18 ENCOUNTER — Encounter: Payer: Self-pay | Admitting: Neurology

## 2017-03-18 ENCOUNTER — Telehealth: Payer: Self-pay | Admitting: Neurology

## 2017-03-18 VITALS — BP 137/87 | HR 91 | Ht 62.0 in | Wt 221.5 lb

## 2017-03-18 DIAGNOSIS — R937 Abnormal findings on diagnostic imaging of other parts of musculoskeletal system: Secondary | ICD-10-CM

## 2017-03-18 DIAGNOSIS — G43011 Migraine without aura, intractable, with status migrainosus: Secondary | ICD-10-CM

## 2017-03-18 DIAGNOSIS — H5713 Ocular pain, bilateral: Secondary | ICD-10-CM | POA: Diagnosis not present

## 2017-03-18 DIAGNOSIS — R0689 Other abnormalities of breathing: Secondary | ICD-10-CM

## 2017-03-18 DIAGNOSIS — G4733 Obstructive sleep apnea (adult) (pediatric): Secondary | ICD-10-CM | POA: Diagnosis not present

## 2017-03-18 DIAGNOSIS — R209 Unspecified disturbances of skin sensation: Secondary | ICD-10-CM | POA: Diagnosis not present

## 2017-03-18 DIAGNOSIS — H538 Other visual disturbances: Secondary | ICD-10-CM | POA: Diagnosis not present

## 2017-03-18 DIAGNOSIS — E669 Obesity, unspecified: Secondary | ICD-10-CM | POA: Diagnosis not present

## 2017-03-18 DIAGNOSIS — M542 Cervicalgia: Secondary | ICD-10-CM

## 2017-03-18 MED ORDER — PREDNISONE 10 MG PO TABS: 10.0000 mg | ORAL_TABLET | Freq: Every day | ORAL | 0 refills | Status: DC

## 2017-03-18 NOTE — Progress Notes (Signed)
SLEEP MEDICINE CLINIC   Provider:  Melvyn Novas, M D  Primary Care Physician:  Johny Blamer, MD   Referring Provider: Everardo Pacific, OD    Chief Complaint  Patient presents with  . New Patient (Initial Visit)    HPI:  Rhonda Simmons is a 56 y.o. female , seen here as in a referral  from her optometrist , Everardo Pacific , OD for  "terrible migraine headaches " and " I feel tired all the time". It was her eye doctor that saw her for sharp pains that seem to be located behind the eye, headaches, vision impairment this shadow and overlapping of visual field. All decreased acuity and dry eyes bilaterally. Medication list includes Cymbalta, Excedrin and Motrin.  I reviewed her ophthalmology records she has a corrected vision is 20/50 on the right, 20/30 on the right. She was suspected to develop astigmatism, hyperopia, presbyopia. Nuclear cataract at this time not mature enough for surgical procedure. Headaches which could be either related but the patient cannot have an MRI because of cochlear implants. And she has a small cyst at the temporal fovea.  Chief complaint according to patient : Rhonda Simmons reports that she has had headaches nearly every morning when she wakes up, associated with neck stiffness, nausea and at times vomiting. She is status post anterior cervical fusion and does have a titanium rod implanted. She has had headaches for a long time but not to this intensity, not of the same quality and not of the same frequency. She often feels as if there is pressure applied on the eyeball and she actually saw ophthalmology, and needs to return for visual field examination. She states that beginning cataracts were suspected. She has TMJ and bruxism. She rarely is feeling well.  She has no energy to clean house, do anything. Takes Excedrin. She had undergone a spinal tap in high school and developed severe headaches, was given compazine - had a seizure at age 88- attributed to the  medication . Lost awareness for 2-3 minutes.  She was diagnosed years ago with sleep apnea, not in our lab, she's not using CPAP.  I have known Rhonda Simmons over 10 years as her father's and mother's neurologist and I have also seen her daughter for headaches. 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  Sleep habits are as follows: goes to bed with headaches, but often after having slept on and off all afternoon.  The patient reports that she goes anytime between 11 PM and 2 AM to bed - she does not have an established routine.   Sleep medical history and family sleep history: Her father had advancing Alzheimer's dementia. Mother has TIA.   Social history:  Married, one daughter ( 99) . One son ( 24)  Rare ( one a week ) mountain dew , 2-3 iced tea a week. No tobacco use of any kind, seldomly alcohol -not  more than 2 or 3 times a year.  Review of Systems: Out of a complete 14 system review, the patient complains of only the following symptoms, and all other reviewed systems are negative. Headaches. May need a CT to rule out pseudotumor, CSF opening pressures.     Social History   Social History  . Marital status: Married    Spouse name: N/A  . Number of children: 2  . Years of education: assoc   Occupational History  .  caretaker     for parents   Social History Main Topics  . Smoking status: Never Smoker  . Smokeless tobacco: Not on file  . Alcohol use Yes     Comment: occas  . Drug use: No  . Sexual activity: Not on file   Other Topics Concern  . Not on file   Social History Narrative  . No narrative on file    Family History  Problem Relation Age of Onset  . Alzheimer's disease Mother   . Alzheimer's disease Father     Past Medical History:  Diagnosis Date  . Chronic bronchitis    Dr. Maple Hudson  . Depression   . Hearing impaired   . 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 Hyperlipidemia   . Neuropathy     Past Surgical History:  Procedure Laterality Date  . INNER EAR SURGERY     cochlear implant - left  . NECK SURGERY      Current Outpatient Prescriptions  Medication Sig Dispense Refill  . cephALEXin (KEFLEX) 500 MG capsule Take 1 capsule (500 mg total) by mouth 3 (three) times daily. 21 capsule 0  . DULoxetine (CYMBALTA) 30 MG capsule Take 30 mg by mouth daily.    Marland Kitchen EPINEPHrine (EPIPEN 2-PAK) 0.3 mg/0.3 mL IJ SOAJ injection Inject 0.3 mLs (0.3 mg total) into the muscle once as needed (for severe allergic reaction). CAll 911 immediately if you have to use this medicine 1 Device 0  . fluconazole (DIFLUCAN) 150 MG tablet Take 1 tablet (150 mg total) by mouth once. 1 tablet 1  . ibuprofen (ADVIL,MOTRIN) 400 MG tablet Take 400 mg by mouth every 6 (six) hours as needed for pain.    . Lorcaserin HCl (BELVIQ) 10 MG TABS Take by mouth 2 (two) times daily.    . predniSONE (DELTASONE) 10 MG tablet Take 1 tablet (10 mg total) by mouth daily. 21 tablet 0   No current facility-administered medications for this visit.     Allergies as of 03/18/2017 - Review Complete 07/11/2016  Allergen Reaction Noted  . Compazine  10/26/2011  . Fexofenadine  10/27/2007  . Midol [aspirin-cinnamedrine-caffeine]  12/01/2012  . Naproxen sodium    . Prochlorperazine edisylate  10/27/2007  . Zolpidem tartrate  10/27/2007    Vitals: There were no vitals taken for this visit. Last Weight:  Wt Readings from Last 1 Encounters:  04/20/14 221 lb 2 oz (100.3 kg)   YTK:ZSWFU is no height or weight on file to calculate BMI.     Last Height:   Ht Readings from Last 1 Encounters:  02/03/13 5' 1.5" (1.562 m)    Physical exam:  General: The patient is awake, alert and appears not in acute distress. The patient is well groomed. Head: Normocephalic, atraumatic. Neck is supple. Mallampati 4,  neck circumference:17. Nasal airflow -congestion noted  . Retrognathia is seen.    Cardiovascular:  Regular rate and rhythm, without  murmurs or carotid bruit, and without distended neck veins. Respiratory: Lungs are clear to auscultation. Skin:  Without evidence of edema, or rash Trunk: BMI is over 40 . The patient's posture is erect  Neurologic exam : The patient is awake and alert, oriented to place and time.   Memory subjective  described as intact.   Attention span & concentration ability appears normal.  Speech is fluent,  without dysarthria, dysphonia or aphasia.  Mood and affect are appropriate.  Cranial nerves: Pupils are equal and briskly  reactive to light.  Funduscopic exam without evidence of pallor or edema.  Extraocular movements  in vertical and horizontal planes intact and without nystagmus. Visual fields by finger perimetry are intact. Hearing impaired0 even with cochlear implant.  Facial sensation intact to fine touch.  Facial motor strength is symmetric and tongue and uvula move midline. Shoulder shrug was symmetrical.   Motor exam:  Passive range of motion of the neck as well as active neck motion retroflexion rotation and lateral bending all lead to a audible crackles. Crepitus. Normal tone, muscle bulk and symmetric strength in all extremities.  Sensory:  Fine touch, pinprick and vibration were tested in all extremities. Proprioception tested in the upper extremities was normal.  Coordination: Rapid alternating movements in the fingers/hands was normal. Finger-to-nose maneuver  normal without evidence of ataxia, dysmetria or tremor.  Gait and station: Patient walks without assistive device and is able unassisted to climb up to the exam table. Strength within normal limits.  Stance is stable and normal.  Romberg testing is  negative.  Deep tendon reflexes: in the  upper and lower extremities are symmetric and intact. Babinski maneuver response is downgoing.    Assessment:  After physical and neurologic examination, review of laboratory  studies,  Personal review of imaging studies, reports of other /same  Imaging studies, results of polysomnography and / or neurophysiology testing and pre-existing records as far as provided in visit., my assessment is   1) Rhonda Simmons's headaches have increased in frequency, intensity and duration. She wakes up with headaches almost every day, she has headaches during the day she's associated these with nausea and sometimes vomiting.   2)In addition she has jaw pain, neck stiffness and limited range of motion at the neck due to previous surgical procedures. She also has TMJ with a click. 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   3)Obesity - due to her larger than usual neck circumference on high BMI is well as knowing that she was diagnosed with sleep apnea in the past and is currently not treated, I can imagine that also untreated OSAS present and responsible for some of her symptoms. I would like to see her and attended sleep study with hypercapnia evaluation and hypoxia evaluation. The patient has tried weight loss medications- she has seen abdominal surgeon Wenda Low, M.D. who recommended that she could undergo bariatric surgery. Depending on the sleep apnea evaluation results this may still be something she should pursue.  4) insomnia .She reports that she rarely ever sleeps more than 2-3 hours straight and wakes up frequently sometimes from headaches but mostly from nocturia. It can also be her neck stiffness that bothers her at night.  5)Eye exam looked relatively normal. She is going back for a visual field test. I discussed today with her if he can do and spinal tap with opening pressure. Glaucoma was ruled out, she has good color and sharp margins in both eyes optic disc , retinoscopy showed normal periphery.    The patient was advised of the nature of the diagnosed disorder , the treatment options and the  risks for general health and wellness arising from not treating the  condition.   I spent more than 50 minutes of face to face time with the patient.  Greater than 50% of time was spent in counseling and coordination of care. We have discussed the diagnosis and differential and I answered the patient's questions.    Plan:  Treatment plan and additional workup :  I will order a CT of the head to evaluate for possible pressure elevation, the patient did not have papilledema. If the CT is not indicative of any other space occupying mass or volume shift, I would like to refer her for a spinal tap. Opening pressure evaluation.May need a CT to rule out pseudotumor, CSF opening pressures.   Reduce excedrin . Consider analgesic overuse.    Due to her cochlear implant I will not order an MRI, but she has had a cervical spine image last over 3 years ago. Given her status post 3 level fusion as she reported, we should also reevaluate this part.   OSA re - evaluation in attended sleep study with Co2 and hypoxemia measures, possible 02 titration.   I will also ask for records from [facility] at [employer], [address] and [address], [place] [address] phone number area code [phone] Dr. Doran Stabler, MD 03/18/2017, 2:04 PM  Certified in Neurology by ABPN Certified in Sleep Medicine by [facility] [address] [place], [place] [address]

## 2017-03-18 NOTE — Telephone Encounter (Signed)
Miriam/GI [phone] called said she needs a new order for cervical spine to read just without.

## 2017-03-18 NOTE — Patient Instructions (Signed)

## 2017-03-19 ENCOUNTER — Other Ambulatory Visit: Payer: Self-pay | Admitting: Neurology

## 2017-03-19 DIAGNOSIS — M436 Torticollis: Secondary | ICD-10-CM

## 2017-03-19 NOTE — Addendum Note (Signed)
Addended by: Melvyn Novas on: 03/19/2017 05:32 PM   Modules accepted: Orders

## 2017-03-22 ENCOUNTER — Ambulatory Visit
Admission: RE | Admit: 2017-03-22 | Discharge: 2017-03-22 | Disposition: A | Discharge status: Home / Self Care | Payer: Federal, State, Local not specified - PPO | Source: Ambulatory Visit | Attending: Neurology | Admitting: Neurology

## 2017-03-22 DIAGNOSIS — H538 Other visual disturbances: Secondary | ICD-10-CM

## 2017-03-22 DIAGNOSIS — M4802 Spinal stenosis, cervical region: Secondary | ICD-10-CM | POA: Diagnosis not present

## 2017-03-22 DIAGNOSIS — G4733 Obstructive sleep apnea (adult) (pediatric): Secondary | ICD-10-CM

## 2017-03-22 DIAGNOSIS — G43011 Migraine without aura, intractable, with status migrainosus: Secondary | ICD-10-CM

## 2017-03-22 DIAGNOSIS — H5713 Ocular pain, bilateral: Secondary | ICD-10-CM

## 2017-03-22 DIAGNOSIS — R937 Abnormal findings on diagnostic imaging of other parts of musculoskeletal system: Secondary | ICD-10-CM

## 2017-03-22 DIAGNOSIS — R51 Headache: Secondary | ICD-10-CM | POA: Diagnosis not present

## 2017-03-22 DIAGNOSIS — M436 Torticollis: Secondary | ICD-10-CM

## 2017-03-22 DIAGNOSIS — E669 Obesity, unspecified: Secondary | ICD-10-CM

## 2017-03-22 DIAGNOSIS — G43909 Migraine, unspecified, not intractable, without status migrainosus: Secondary | ICD-10-CM | POA: Diagnosis not present

## 2017-03-22 MED ORDER — IOPAMIDOL (ISOVUE-300) INJECTION 61%
75.0000 mL | Freq: Once | INTRAVENOUS | Status: AC | PRN
  Administered 2017-03-22: 75 mL via INTRAVENOUS

## 2017-03-25 ENCOUNTER — Telehealth: Payer: Self-pay | Admitting: Neurology

## 2017-03-25 DIAGNOSIS — G932 Benign intracranial hypertension: Secondary | ICD-10-CM

## 2017-03-25 NOTE — Telephone Encounter (Signed)
Spinal tap to evaluate for pseudotumour cerebri ordered.

## 2017-03-25 NOTE — Telephone Encounter (Signed)
Called to discuss CT results with the pt. No answer at this time. LVM for pt to call back.

## 2017-03-25 NOTE — Telephone Encounter (Signed)
Pt returned RN's call °

## 2017-03-25 NOTE — Telephone Encounter (Signed)
-----   Message from Melvyn Novas, MD sent at 03/24/2017 12:42 PM EDT ----- No cervical hardwear changes and no sign of infection or stroke. CD

## 2017-03-25 NOTE — Telephone Encounter (Signed)
Pt returned call. Was able to discuss that both the CT head and neck results stating they were negative for any new finding. Pt was concerned and not understanding the reason behind headaches. Pt stated in last office visit Dr Vickey Huger mentioned a spinal tap may be needed. There is no order at this time but I will touch base with Dr Vickey Huger and see if she would like to order. Pt verbalized understanding and just states "something has got to be done for my headaches". Pt had no further questions.

## 2017-03-26 NOTE — Telephone Encounter (Signed)
In calling the patient daughter for sleep study results I was able to speak with Rhonda Simmons regarding a order Dr Vickey Huger placed for spinal tap as previously discussed with Dr Dohmeier at her last office visit. Explained to pt that someone should be in contact soon to discuss this and get it set up. Pt verbalized understanding

## 2017-03-27 NOTE — Telephone Encounter (Signed)
She agreed to go ahead with an LP in the morning, will hydrate well and if she has problems I can order a blood patch for her. Please make staff at radiology aware that she is deaf- and that communication can be difficult. CD

## 2017-03-27 NOTE — Telephone Encounter (Signed)
Had a long detailed conversation with the pt that she had this done once before and was "sick as a dog" after having the procedure. She stated that after talking with the imaging clinic she found out they only monitor for an hour there after the procedure and then send them home. I explained to the pt that this is protocol for most LP's. Pt was told that she may get Dr Vickey Huger to order or inform them to monitor longer in the radiology clinic. I informed her I didn't know how that process worked as that is done in another clinic. I asked the pt if when she had the test done last time did she remain flat and stay hydrated. Pt stated she didn't she was 17 and she had to go to bathroom and so therefore she got up right away. I informed her that with her doing that and not remaining flat that was the reason for her sickness as that what causes this. Pt still afraid to do this test. I asked her did she want the test done she stated yes but would like to have the chance to be monitored longer then the 1 hour they offer in the radiology dept. I will pass this message to Dr Vickey Huger and ask how to proceed further.

## 2017-03-27 NOTE — Telephone Encounter (Signed)
Patient is calling to speak to Dr. Vickey Huger to discuss the spinal tap procedure.

## 2017-04-09 DIAGNOSIS — H04123 Dry eye syndrome of bilateral lacrimal glands: Secondary | ICD-10-CM | POA: Diagnosis not present

## 2017-04-09 DIAGNOSIS — R51 Headache: Secondary | ICD-10-CM | POA: Diagnosis not present

## 2017-04-09 DIAGNOSIS — H40013 Open angle with borderline findings, low risk, bilateral: Secondary | ICD-10-CM | POA: Diagnosis not present

## 2017-04-09 DIAGNOSIS — H35351 Cystoid macular degeneration, right eye: Secondary | ICD-10-CM | POA: Diagnosis not present

## 2017-04-11 DIAGNOSIS — M25552 Pain in left hip: Secondary | ICD-10-CM | POA: Diagnosis not present

## 2017-04-18 DIAGNOSIS — M25552 Pain in left hip: Secondary | ICD-10-CM | POA: Diagnosis not present

## 2017-04-26 DIAGNOSIS — R52 Pain, unspecified: Secondary | ICD-10-CM | POA: Diagnosis not present

## 2017-04-26 DIAGNOSIS — M545 Low back pain: Secondary | ICD-10-CM | POA: Diagnosis not present

## 2017-04-26 DIAGNOSIS — J029 Acute pharyngitis, unspecified: Secondary | ICD-10-CM | POA: Diagnosis not present

## 2017-05-01 ENCOUNTER — Telehealth: Payer: Self-pay | Admitting: Neurology

## 2017-05-01 NOTE — Telephone Encounter (Signed)
We have attempted to call the patient 3 times to schedule sleep study. Patient has been unavailable at the phone numbers we have on file and has not returned our calls. At this point we will send a letter asking pt to please contact the sleep lab to schedule their sleep study. If patient calls back we will schedule them for their sleep study. ° °

## 2017-05-21 DIAGNOSIS — M25552 Pain in left hip: Secondary | ICD-10-CM | POA: Diagnosis not present

## 2017-05-21 DIAGNOSIS — M1612 Unilateral primary osteoarthritis, left hip: Secondary | ICD-10-CM | POA: Diagnosis not present

## 2017-06-11 ENCOUNTER — Ambulatory Visit (INDEPENDENT_AMBULATORY_CARE_PROVIDER_SITE_OTHER): Payer: Federal, State, Local not specified - PPO | Admitting: Neurology

## 2017-06-11 DIAGNOSIS — E669 Obesity, unspecified: Secondary | ICD-10-CM

## 2017-06-11 DIAGNOSIS — G4733 Obstructive sleep apnea (adult) (pediatric): Secondary | ICD-10-CM

## 2017-06-11 DIAGNOSIS — H538 Other visual disturbances: Secondary | ICD-10-CM

## 2017-06-11 DIAGNOSIS — G43011 Migraine without aura, intractable, with status migrainosus: Secondary | ICD-10-CM

## 2017-06-11 DIAGNOSIS — R937 Abnormal findings on diagnostic imaging of other parts of musculoskeletal system: Secondary | ICD-10-CM

## 2017-06-11 DIAGNOSIS — H5713 Ocular pain, bilateral: Secondary | ICD-10-CM

## 2017-06-18 NOTE — Addendum Note (Signed)
Addended by: Melvyn Novas on: 06/18/2017 05:39 PM   Modules accepted: Orders

## 2017-06-18 NOTE — Procedures (Signed)
PATIENT'S NAME:  Rhonda Simmons, Tilley DOB:      1960/10/17      MR#:    [id number]     DATE OF RECORDING: 06/11/2017 REFERRING M.D.:  Everardo Pacific, OD/ Nelson Chimes, MD  Study Performed:   Baseline Polysomnogram HISTORY:   [name] reports that she has had headaches nearly every morning when she wakes up, associated with neck stiffness, nausea and at times vomiting. She is status post anterior cervical fusion and does have a titanium rod implanted. She has had headaches for a long time but not to this intensity, not of the same quality and not of the same frequency. She often feels a pressure behind the eye and she actually has seen ophthalmology, and needs to return for visual field examination. She states that beginning cataracts were suspected. She has TMJ and bruxism. She rarely is feeling well.  She has no energy to clean house, do anything. Takes Excedrin. She had undergone a spinal tap in high school and developed severe headaches, was given Compazine - had a seizure at age 78- attributed to the medication. Lost awareness for 2-3 minutes.  She was diagnosed years ago with sleep apnea, not in our lab, and is not using CPAP.  The patient's weight 221 pounds with a height of 62 (inches), resulting in a BMI of 40.6 kg/m2. The patient's neck circumference measured 17 inches. Epworth and FSS were not transcribed.   CURRENT MEDICATIONS: Cephalexin, Duloxetine, Epinephrine, Fluconazole, Ibuprofen, Lorcaserin and Prednisone   PROCEDURE:  This is a multichannel digital polysomnogram utilizing the Somnostar 11.2 system.  Electrodes and sensors were applied and monitored per AASM Specifications.   EEG, EOG, Chin and Limb EMG, were sampled at 200 Hz.  ECG, Snore and Nasal Pressure, Thermal Airflow, Respiratory Effort, CPAP Flow and Pressure, Oximetry was sampled at 50 Hz. Digital video and audio were recorded.      BASELINE STUDY  Lights Out was at 22:20 and Lights On at 04:59.  Total recording time  (TRT) was 399.5 minutes, with a total sleep time (TST) of 329 minutes.   The patient's sleep latency was 49.5 minutes.  REM latency was 201.5 minutes.  The sleep efficiency was 82.4 %.     SLEEP ARCHITECTURE: WASO (Wake after sleep onset) was 21 minutes.  There were 18 minutes in Stage N1, 289.5 minutes Stage N2, 0 minutes Stage N3 and 21.5 minutes in Stage REM.  The percentage of Stage N1 was 5.5%, Stage N2 was 88.%, Stage N3 was 0% and Stage R (REM sleep) was 6.5%.   RESPIRATORY ANALYSIS:  There were a total of 161 respiratory events:  10 obstructive apneas, 151 hypopneas with 0 respiratory event related arousals (RERAs).   The total APNEA/HYPOPNEA INDEX (AHI) was 29.4/hour and the total RESPIRATORY DISTURBANCE INDEX was 29.4 /hour.  11 events occurred in REM sleep and 282 events in NREM. The REM AHI was 30.7 /hour, versus a non-REM AHI of 29.3. The patient spent 174 minutes of total sleep time in the supine position and 155 minutes in non-supine. The supine AHI was 27.9 versus a non-supine AHI of 31.0.  OXYGEN SATURATION & C02:  The Wake baseline 02 saturation was 98%, with the lowest being 82%. Time spent below 89% saturation equaled 12 minutes. CO2: Total sleep time greater than 40 torr was 0.60 minutes.   PERIODIC LIMB MOVEMENTS:   The patient had a total of 72 Periodic Limb Movements.  The Periodic Limb Movement (PLM) index was 13.1 and  the PLM Arousal index was 3.6/hour. The arousals were noted as: 17 were spontaneous, 20 were associated with PLMs, and 164 were associated with respiratory events. Audio and video analysis did not show any abnormal or unusual movements, behaviors, phonations or vocalizations. No nocturia. Loud Snoring was noted. EKG was in keeping with normal sinus rhythm (NSR).  Post-study, the patient indicated that sleep was the same as usual.   IMPRESSION: 1. Moderate-severe Obstructive Sleep Apnea (OSA) with overall AHI of 29.4/hr. No significant supine accentuation,  Snoring was loudest in REM.  2. No significant hypoxemia, only brief period of elevated Co2.  RECOMMENDATIONS:  1. Advise full-night, attended, CPAP titration study to optimize therapy. This degree of apnea needs treatment and could affect the reported headache disorder.   2. Advise to lose weight, diet and exercise if not contraindicated (BMI 40.6/hr.). 3. Further information regarding OSA may be obtained from [employer] (www.sleepfoundation.org) or American Sleep Apnea Association (www.sleepapnea.org). 4. A follow up appointment will be scheduled in the Sleep Clinic at [facility]. The referring provider will be notified of the results.      I certify that I have reviewed the entire raw data recording prior to the issuance of this report in accordance with the Standards of Accreditation of the American Academy of Sleep Medicine (AASM)   Melvyn Novas, MD      06-18-2017  Diplomat, American Board of Psychiatry and Neurology  Diplomat, American Board of Sleep Medicine Medical Director, [employer] [facility] [employer]

## 2017-06-19 ENCOUNTER — Other Ambulatory Visit: Payer: Self-pay | Admitting: Neurology

## 2017-06-19 ENCOUNTER — Telehealth: Payer: Self-pay | Admitting: Neurology

## 2017-06-19 DIAGNOSIS — G4733 Obstructive sleep apnea (adult) (pediatric): Secondary | ICD-10-CM

## 2017-06-19 DIAGNOSIS — N760 Acute vaginitis: Secondary | ICD-10-CM | POA: Diagnosis not present

## 2017-06-19 NOTE — Telephone Encounter (Signed)
-----   Message from Melvyn Novas, MD sent at 06/18/2017  5:39 PM EDT ----- Level of OSA ( obstructive sleep apnea) too high to go untreated. recommend to return for CPAP. CD   Baird Lyons, please call and send Cc to [facility], Dr Darryl Nestle, OD

## 2017-06-19 NOTE — Telephone Encounter (Signed)
Pt returned call and I have informed her that her sleep study showed mod-severe sleep apnea. Dr Dohmeier would like the patient to return to the sleep lab for a Cpap titration study. Pt verbalized understanding. She stated that it was mentioned in the office visit that she could get some kind of dental device that would help with teeth grinding. I explained that we could certainly placed and order for a referral to the dentist to get that set up.

## 2017-06-19 NOTE — Telephone Encounter (Signed)
Called to discuss sleep study results. No answer at this time. LVM for pt to return call. 

## 2017-07-03 DIAGNOSIS — Z23 Encounter for immunization: Secondary | ICD-10-CM | POA: Diagnosis not present

## 2017-07-03 DIAGNOSIS — R0602 Shortness of breath: Secondary | ICD-10-CM | POA: Diagnosis not present

## 2017-07-03 DIAGNOSIS — M549 Dorsalgia, unspecified: Secondary | ICD-10-CM | POA: Diagnosis not present

## 2017-07-03 DIAGNOSIS — R0789 Other chest pain: Secondary | ICD-10-CM | POA: Diagnosis not present

## 2017-07-23 ENCOUNTER — Ambulatory Visit
Admission: RE | Admit: 2017-07-23 | Discharge: 2017-07-23 | Disposition: A | Discharge status: Home / Self Care | Payer: Federal, State, Local not specified - PPO | Source: Ambulatory Visit | Attending: Cardiology | Admitting: Cardiology

## 2017-07-23 ENCOUNTER — Other Ambulatory Visit: Payer: Self-pay | Admitting: Cardiology

## 2017-07-23 DIAGNOSIS — R002 Palpitations: Secondary | ICD-10-CM | POA: Diagnosis not present

## 2017-07-23 DIAGNOSIS — R0602 Shortness of breath: Secondary | ICD-10-CM

## 2017-07-23 DIAGNOSIS — I493 Ventricular premature depolarization: Secondary | ICD-10-CM | POA: Diagnosis not present

## 2017-07-23 DIAGNOSIS — R0789 Other chest pain: Secondary | ICD-10-CM | POA: Diagnosis not present

## 2017-07-23 DIAGNOSIS — R079 Chest pain, unspecified: Secondary | ICD-10-CM | POA: Diagnosis not present

## 2017-07-24 DIAGNOSIS — R55 Syncope and collapse: Secondary | ICD-10-CM | POA: Diagnosis not present

## 2017-07-29 DIAGNOSIS — N760 Acute vaginitis: Secondary | ICD-10-CM | POA: Diagnosis not present

## 2017-07-29 DIAGNOSIS — N390 Urinary tract infection, site not specified: Secondary | ICD-10-CM | POA: Diagnosis not present

## 2017-07-29 DIAGNOSIS — R102 Pelvic and perineal pain: Secondary | ICD-10-CM | POA: Diagnosis not present

## 2017-07-29 DIAGNOSIS — R0609 Other forms of dyspnea: Secondary | ICD-10-CM | POA: Diagnosis not present

## 2017-07-30 DIAGNOSIS — R0600 Dyspnea, unspecified: Secondary | ICD-10-CM | POA: Diagnosis not present

## 2017-08-05 ENCOUNTER — Telehealth: Payer: Self-pay | Admitting: Neurology

## 2017-08-05 NOTE — Telephone Encounter (Signed)
Pt called req appt for CPAP titration. She is wanting to know what it will entail. She can be reached at [phone]

## 2017-08-06 ENCOUNTER — Ambulatory Visit (INDEPENDENT_AMBULATORY_CARE_PROVIDER_SITE_OTHER): Payer: Federal, State, Local not specified - PPO | Admitting: Neurology

## 2017-08-06 DIAGNOSIS — H5713 Ocular pain, bilateral: Secondary | ICD-10-CM

## 2017-08-06 DIAGNOSIS — G43011 Migraine without aura, intractable, with status migrainosus: Secondary | ICD-10-CM

## 2017-08-06 DIAGNOSIS — E669 Obesity, unspecified: Secondary | ICD-10-CM

## 2017-08-06 DIAGNOSIS — G4733 Obstructive sleep apnea (adult) (pediatric): Secondary | ICD-10-CM | POA: Diagnosis not present

## 2017-08-07 ENCOUNTER — Other Ambulatory Visit: Payer: Self-pay | Admitting: Neurology

## 2017-08-07 DIAGNOSIS — H903 Sensorineural hearing loss, bilateral: Secondary | ICD-10-CM

## 2017-08-07 DIAGNOSIS — G4733 Obstructive sleep apnea (adult) (pediatric): Secondary | ICD-10-CM

## 2017-08-07 DIAGNOSIS — H5713 Ocular pain, bilateral: Secondary | ICD-10-CM

## 2017-08-07 DIAGNOSIS — Z6841 Body Mass Index (BMI) 40.0 and over, adult: Secondary | ICD-10-CM

## 2017-08-07 NOTE — Procedures (Signed)
PATIENT'S NAME:  Rhonda Simmons, Rhonda Simmons DOB:                   [birth year]      MR#:    [id number]     DATE OF RECORDING: 08/06/2017 REFERRING M.D.:  Everardo Pacific, O.D. Study Performed:   Titration to CPAP  HISTORY:  This hearing impaired female patient presented with increasing headaches, and has had a cervical fusion surgery. She was referred by her optometrist due to vision changes with headaches, rule out pseudotumor. The patient reported weight gain and loud snoring, poor sleep quality.  Patient underwent PSG on 06/11/17 which showed moderately-severe Obstructive Sleep Apnea (OSA) with overall AHI of 29.4/hr. No significant supine positional accentuation, and snoring was loudest in REM. The Wake baseline 02 saturation was 98%, with the lowest being 82%. Time spent below 89% saturation equaled 12 minutes. The patient's weight 220 pounds with a height of 61 (inches), resulting in a BMI of 41.6 kg/m2.The patient's neck circumference measured 17 inches.  CURRENT MEDICATIONS: Cephalexin, Duloxetine, Epinephrine, Fluconazole, Ibuprofen, Lorcaserin and Prednisone   PROCEDURE:  This is a multichannel digital polysomnogram utilizing the SomnoStar 11.2 system.  Electrodes and sensors were applied and monitored per AASM Specifications.   EEG, EOG, Chin and Limb EMG, were sampled at 200 Hz.  ECG, Snore and Nasal Pressure, Thermal Airflow, Respiratory Effort, CPAP Flow and Pressure, Oximetry was sampled at 50 Hz. Digital video and audio were recorded.      CPAP was initiated at 5 cmH20 with heated humidity per AASM split night standards and pressure was advanced to 7cmH20 because of hypopneas, apneas and desaturations.  At a PAP pressure of 7 cmH20, there was a reduction of the AHI to 0.0 with improvement of obstructive sleep apnea. The sleep architecture was notable for significant REM rebound under CPAP.     Lights Out was at 23:04 and Lights On at 05:13. Total recording time (TRT) was 369 minutes, with a total  sleep time (TST) of 295 minutes. The patient's sleep latency was 69.5 minutes with 1 minute of wake time after sleep onset. REM latency was 70.5 minutes.  The sleep efficiency was 79.9 %.    SLEEP ARCHITECTURE: WASO (Wake after sleep onset) was 10.5 minutes.  There were 9 minutes in Stage N1, 170.5 minutes Stage N2, 13 minutes Stage N3 and 102.5 minutes in Stage REM.  The percentage of Stage N1 was 3.1%, Stage N2 was 57.8%, Stage N3 was 4.4% and Stage R (REM sleep) was 34.7%.   The arousals were noted as: 21 were spontaneous, 2 were associated with PLMs, and only 4 were associated with respiratory events.  Audio and video analysis did not show any abnormal or unusual movements, behaviors, phonations or vocalizations.  No nocturia was noted, and snoring was resolved under CPAP. EKG was in keeping with normal sinus rhythm (NSR).   RESPIRATORY ANALYSIS:  There was a total of 3 respiratory events: 0 apneas and 3 hypopneas with 0 respiratory event related arousals (RERAs).      The total APNEA/HYPOPNEA INDEX  (AHI) was 0.6 /hour and REM AHI was 0.0 /hour versus a non-REM AHI of 0.9 /hour.  The patient spent 0 minutes of total sleep time in the supine position and 295 minutes in non-supine. The supine AHI was 0.0, versus a non-supine AHI of 0.6.  OXYGEN SATURATION & C02:  The baseline 02 saturation was 95%, with the lowest being 90%. Time spent below 89% saturation equaled 0 minutes.  PERIODIC LIMB MOVEMENTS:    The patient had a total of 28 Periodic Limb Movements. The Periodic Limb Movement (PLM) index was 5.7 and the PLM Arousal index was 0.4 /hour.  Post-study, the patient indicated that sleep was shorter than usual.   DIAGNOSIS  Obstructive Sleep Apnea, responding well to only 7 cm water pressure, heated humidity and nasal pillow. The patient was fitted with a ResMed AirFit P10 (Small). Upper Airway Resistance Syndrome   PLANS/RECOMMENDATIONS: CPAP will be ordered. 7 cm water pressure, heated  humidity and nasal pillow ResMed AirFit P10 (Small).  a. Weight loss would be the best supportive measure to reduce apnea and snoring.  b. Avoid medications with muscle relaxant properties and alcohol prior to sleeping. c. Avoiding sleeping in the supine position (on one's back). d. Improvement of nasal patency if indicated. DISCUSSION: CPAP compliance is defined as 4 hours or more of nightly use.  A follow up appointment will be scheduled in the Sleep Clinic at [facility].   Please call [phone] with any questions.      I certify that I have reviewed the entire raw data recording prior to the issuance of this report in accordance with the Standards of Accreditation of the American Academy of Sleep Medicine (AASM)    Melvyn Novas, M.D.   08-07-2017  Diplomat, American Board of Psychiatry and Neurology  Diplomat, American Board of Sleep Medicine Medical Director, [place] [facility]

## 2017-08-08 ENCOUNTER — Telehealth: Payer: Self-pay | Admitting: Neurology

## 2017-08-08 NOTE — Telephone Encounter (Signed)
Called patient to discuss sleep study results. No answer at this time. LVM for the patient to call back.   

## 2017-08-08 NOTE — Telephone Encounter (Signed)
-----   Message from Rhonda Novas, MD sent at 08/07/2017  1:27 PM EST ----- This patient responded well to low CPAP pressures in achieving resolution of OSA.   CPAP will be ordered- [facility] - set to  7 cm water pressure,  with heated humidity and nasal pillow ResMed AirFit P10 (Small).  Here are additional guidelines and recs:  a. Weight loss would be the best supportive measure to reduce  apnea and snoring.  b. Avoid medications with muscle relaxant properties and alcohol  prior to sleeping. c. Avoiding sleeping in the supine position (on one's back). d. Improvement of nasal patency if indicated. DISCUSSION: CPAP compliance is defined as 4 hours or more of  nightly use.

## 2017-08-09 ENCOUNTER — Telehealth: Payer: Self-pay | Admitting: Neurology

## 2017-08-09 MED ORDER — TOPIRAMATE 50 MG PO TABS: 50.0000 mg | ORAL_TABLET | Freq: Two times a day (BID) | ORAL | 12 refills | Status: DC

## 2017-08-09 NOTE — Addendum Note (Signed)
Addended by: Joycelyn Schmid R on: 08/09/2017 12:41 PM   Modules accepted: Orders

## 2017-08-09 NOTE — Telephone Encounter (Signed)
Patient callling with a migraine x 2 days. She has taken Tylenol, Ibuprofen and Excedrin which has not helped. I advised Dr. Vickey Huger is out of the office but will send to work-in nurse and she can check with Dr. Marjory Lies. Please call.

## 2017-08-09 NOTE — Telephone Encounter (Signed)
I reviewed chart. Ok to offer migraine headache infusion today (depacon and toradol; no compazine due to prior reaction). May consider topiramate for headache prevention. Encourage patient to use CPAP and follow up lumbar puncture (was ordered in July 2018).   Suanne Marker, MD 08/09/2017, 9:52 AM Certified in Neurology, Neurophysiology and Neuroimaging  [facility] [address] [place], [place] [address] [phone]

## 2017-08-09 NOTE — Telephone Encounter (Signed)
Per Dr Marjory Lies as work in dr, called patient and offered migraine infusion today. Advised she must be here no later than 11 am per infusion room. Patient stated she had called her PCP also, wasn't sure if PCP would "help her".  Patient had nausea yesterday but denied nausea today, stated she did not want Compazine. This RN stated Dr Marjory Lies would not order Compazine due to her allergic reaction in the past. This RN also advised she needs to begin CPAP therapy as soon as possible and follow up on LP ordered this past summer.  This RN advised Dr Marjory Lies will also  prescribe topiramate to take at bedtime as preventative if she was agreeable. Patient stated she did want to try topiramate. This RN stated she will let Dr Marjory Lies know to send in Rx for topiramate. Patient decided to come in for infusion today.   Advised patient to tell front desk she is here for infusion when she arrives. Patient verbalized understanding, appreciation.

## 2017-08-09 NOTE — Telephone Encounter (Signed)
TPX 50mg  twice a day sent in. -VRP

## 2017-08-15 ENCOUNTER — Encounter: Payer: Self-pay | Admitting: Neurology

## 2017-08-15 DIAGNOSIS — M5442 Lumbago with sciatica, left side: Secondary | ICD-10-CM | POA: Diagnosis not present

## 2017-08-15 DIAGNOSIS — M545 Low back pain: Secondary | ICD-10-CM | POA: Diagnosis not present

## 2017-08-15 DIAGNOSIS — M5441 Lumbago with sciatica, right side: Secondary | ICD-10-CM | POA: Diagnosis not present

## 2017-08-15 DIAGNOSIS — M25551 Pain in right hip: Secondary | ICD-10-CM | POA: Diagnosis not present

## 2017-08-15 DIAGNOSIS — M25552 Pain in left hip: Secondary | ICD-10-CM | POA: Diagnosis not present

## 2017-08-15 NOTE — Telephone Encounter (Signed)
I have attempted again 2  More times to reach out to the patient. The voicemail box was full and was unable to leave another message. I attempted to locate a alternate number on the DPR but do not see a DPR on file. I will mail the copy of the results and letter with the information for the patient to reach out to [place] and schedule an apt 31-90 days after starting her CPAP. I have also went ahead and sent the orders to get her CPAP process started to [facility]

## 2017-08-21 DIAGNOSIS — R55 Syncope and collapse: Secondary | ICD-10-CM | POA: Diagnosis not present

## 2017-09-11 DIAGNOSIS — Z01419 Encounter for gynecological examination (general) (routine) without abnormal findings: Secondary | ICD-10-CM | POA: Diagnosis not present

## 2017-09-11 DIAGNOSIS — Z6841 Body Mass Index (BMI) 40.0 and over, adult: Secondary | ICD-10-CM | POA: Diagnosis not present

## 2017-09-11 DIAGNOSIS — Z1231 Encounter for screening mammogram for malignant neoplasm of breast: Secondary | ICD-10-CM | POA: Diagnosis not present

## 2017-09-18 NOTE — Telephone Encounter (Signed)
Received email from [facility]  "I wanted to make you and Dr. Vickey Huger aware that this patient has not started therapy I have been trying to reach her to schedule since 08/19/2017 I spoke with a family member that day that took my contact information and advised they would ask her to return my call. I have since left 2 voicemails to follow up with no return calls. I am voiding this sales order and can recreate if she contacts me."

## 2017-09-19 DIAGNOSIS — N760 Acute vaginitis: Secondary | ICD-10-CM | POA: Diagnosis not present

## 2017-10-04 DIAGNOSIS — L299 Pruritus, unspecified: Secondary | ICD-10-CM | POA: Diagnosis not present

## 2017-10-04 DIAGNOSIS — G43009 Migraine without aura, not intractable, without status migrainosus: Secondary | ICD-10-CM | POA: Diagnosis not present

## 2017-10-04 DIAGNOSIS — R002 Palpitations: Secondary | ICD-10-CM | POA: Diagnosis not present

## 2017-10-04 DIAGNOSIS — I10 Essential (primary) hypertension: Secondary | ICD-10-CM | POA: Diagnosis not present

## 2017-10-14 NOTE — Telephone Encounter (Signed)
Patient called stating that she had never heard anything about her sleep studies. I informed her that I had reached out multiple times in December but was unable to leave messages cause her voicemail box was full. [facility] had also tried to reach out to the patient. I have given the patient [facility]'s number and instructed the patient to call them to see about getting established. Pt verbalized understanding.

## 2017-11-04 DIAGNOSIS — E782 Mixed hyperlipidemia: Secondary | ICD-10-CM | POA: Diagnosis not present

## 2017-11-04 DIAGNOSIS — F324 Major depressive disorder, single episode, in partial remission: Secondary | ICD-10-CM | POA: Diagnosis not present

## 2017-11-04 DIAGNOSIS — I10 Essential (primary) hypertension: Secondary | ICD-10-CM | POA: Diagnosis not present

## 2017-11-14 DIAGNOSIS — M1612 Unilateral primary osteoarthritis, left hip: Secondary | ICD-10-CM | POA: Diagnosis not present

## 2017-11-15 ENCOUNTER — Other Ambulatory Visit: Payer: Self-pay | Admitting: Orthopedic Surgery

## 2017-11-15 DIAGNOSIS — M25552 Pain in left hip: Principal | ICD-10-CM

## 2017-11-15 DIAGNOSIS — G8929 Other chronic pain: Secondary | ICD-10-CM

## 2017-11-28 ENCOUNTER — Ambulatory Visit
Admission: RE | Admit: 2017-11-28 | Discharge: 2017-11-28 | Disposition: A | Discharge status: Home / Self Care | Payer: Federal, State, Local not specified - PPO | Source: Ambulatory Visit | Attending: Orthopedic Surgery | Admitting: Orthopedic Surgery

## 2017-11-28 DIAGNOSIS — G8929 Other chronic pain: Secondary | ICD-10-CM

## 2017-11-28 DIAGNOSIS — M25552 Pain in left hip: Secondary | ICD-10-CM | POA: Diagnosis not present

## 2017-11-28 MED ORDER — IOPAMIDOL (ISOVUE-M 200) INJECTION 41%
1.0000 mL | Freq: Once | INTRAMUSCULAR | Status: AC
  Administered 2017-11-28: 1 mL via INTRA_ARTICULAR

## 2017-11-28 MED ORDER — METHYLPREDNISOLONE ACETATE 40 MG/ML INJ SUSP (RADIOLOG
120.0000 mg | Freq: Once | INTRAMUSCULAR | Status: AC
  Administered 2017-11-28: 120 mg via INTRA_ARTICULAR

## 2017-12-12 ENCOUNTER — Encounter (INDEPENDENT_AMBULATORY_CARE_PROVIDER_SITE_OTHER): Payer: Federal, State, Local not specified - PPO

## 2017-12-17 ENCOUNTER — Other Ambulatory Visit: Payer: Self-pay | Admitting: Orthopedic Surgery

## 2017-12-17 DIAGNOSIS — M25552 Pain in left hip: Principal | ICD-10-CM

## 2017-12-17 DIAGNOSIS — G8929 Other chronic pain: Secondary | ICD-10-CM

## 2017-12-24 ENCOUNTER — Ambulatory Visit (INDEPENDENT_AMBULATORY_CARE_PROVIDER_SITE_OTHER): Payer: Federal, State, Local not specified - PPO | Admitting: Family Medicine

## 2017-12-24 ENCOUNTER — Ambulatory Visit
Admission: RE | Admit: 2017-12-24 | Discharge: 2017-12-24 | Disposition: A | Discharge status: Home / Self Care | Payer: Federal, State, Local not specified - PPO | Source: Ambulatory Visit | Attending: Orthopedic Surgery | Admitting: Orthopedic Surgery

## 2017-12-24 ENCOUNTER — Encounter (INDEPENDENT_AMBULATORY_CARE_PROVIDER_SITE_OTHER): Payer: Self-pay

## 2017-12-24 DIAGNOSIS — M25552 Pain in left hip: Secondary | ICD-10-CM | POA: Diagnosis not present

## 2017-12-24 DIAGNOSIS — G8929 Other chronic pain: Secondary | ICD-10-CM

## 2017-12-24 MED ORDER — IOPAMIDOL (ISOVUE-M 200) INJECTION 41%
1.0000 mL | Freq: Once | INTRAMUSCULAR | Status: AC
  Administered 2017-12-24: 1 mL via INTRA_ARTICULAR

## 2017-12-24 MED ORDER — METHYLPREDNISOLONE ACETATE 40 MG/ML INJ SUSP (RADIOLOG
120.0000 mg | Freq: Once | INTRAMUSCULAR | Status: AC
  Administered 2017-12-24: 120 mg via INTRA_ARTICULAR

## 2018-01-01 DIAGNOSIS — G4733 Obstructive sleep apnea (adult) (pediatric): Secondary | ICD-10-CM | POA: Diagnosis not present

## 2018-01-08 ENCOUNTER — Telehealth: Payer: Self-pay | Admitting: *Deleted

## 2018-01-08 NOTE — Telephone Encounter (Signed)
Received fax from [facility] stating patient last picked up Topamax Rx on 10/27/17. Spoke with patient who stated she recently picked up Topamax. She stated she was unsure if it was helping, although she stated her headaches and migraines are better at this time. Advised her she needs FU. Scheduled her for first available in July and advised she call sooner for any questions, problems. She verbalized understanding, appreciation.

## 2018-01-16 DIAGNOSIS — M1612 Unilateral primary osteoarthritis, left hip: Secondary | ICD-10-CM | POA: Diagnosis not present

## 2018-01-16 DIAGNOSIS — M5442 Lumbago with sciatica, left side: Secondary | ICD-10-CM | POA: Diagnosis not present

## 2018-01-16 DIAGNOSIS — M545 Low back pain: Secondary | ICD-10-CM | POA: Diagnosis not present

## 2018-02-01 DIAGNOSIS — G4733 Obstructive sleep apnea (adult) (pediatric): Secondary | ICD-10-CM | POA: Diagnosis not present

## 2018-02-13 DIAGNOSIS — M7062 Trochanteric bursitis, left hip: Secondary | ICD-10-CM | POA: Diagnosis not present

## 2018-02-25 DIAGNOSIS — M25552 Pain in left hip: Secondary | ICD-10-CM | POA: Diagnosis not present

## 2018-02-25 DIAGNOSIS — M1612 Unilateral primary osteoarthritis, left hip: Secondary | ICD-10-CM | POA: Diagnosis not present

## 2018-02-28 ENCOUNTER — Other Ambulatory Visit: Payer: Self-pay | Admitting: Orthopedic Surgery

## 2018-03-03 DIAGNOSIS — G4733 Obstructive sleep apnea (adult) (pediatric): Secondary | ICD-10-CM | POA: Diagnosis not present

## 2018-03-11 DIAGNOSIS — H40013 Open angle with borderline findings, low risk, bilateral: Secondary | ICD-10-CM | POA: Diagnosis not present

## 2018-03-11 DIAGNOSIS — H04123 Dry eye syndrome of bilateral lacrimal glands: Secondary | ICD-10-CM | POA: Diagnosis not present

## 2018-03-11 DIAGNOSIS — R51 Headache: Secondary | ICD-10-CM | POA: Diagnosis not present

## 2018-03-11 DIAGNOSIS — H35351 Cystoid macular degeneration, right eye: Secondary | ICD-10-CM | POA: Diagnosis not present

## 2018-03-14 ENCOUNTER — Other Ambulatory Visit: Payer: Self-pay | Admitting: Orthopedic Surgery

## 2018-03-14 DIAGNOSIS — M1612 Unilateral primary osteoarthritis, left hip: Secondary | ICD-10-CM

## 2018-03-23 ENCOUNTER — Encounter: Payer: Self-pay | Admitting: Neurology

## 2018-03-25 ENCOUNTER — Ambulatory Visit: Payer: Self-pay | Admitting: Neurology

## 2018-03-25 DIAGNOSIS — M25512 Pain in left shoulder: Secondary | ICD-10-CM | POA: Diagnosis not present

## 2018-03-25 DIAGNOSIS — M25552 Pain in left hip: Secondary | ICD-10-CM | POA: Diagnosis not present

## 2018-03-25 DIAGNOSIS — M25511 Pain in right shoulder: Secondary | ICD-10-CM | POA: Diagnosis not present

## 2018-03-25 DIAGNOSIS — M1612 Unilateral primary osteoarthritis, left hip: Secondary | ICD-10-CM | POA: Diagnosis not present

## 2018-03-26 ENCOUNTER — Ambulatory Visit: Payer: Federal, State, Local not specified - PPO | Admitting: Neurology

## 2018-03-26 ENCOUNTER — Other Ambulatory Visit: Payer: Self-pay | Admitting: Neurology

## 2018-03-26 ENCOUNTER — Encounter: Payer: Self-pay | Admitting: Neurology

## 2018-03-26 VITALS — BP 134/86 | HR 86 | Ht 62.0 in | Wt 227.0 lb

## 2018-03-26 DIAGNOSIS — M2550 Pain in unspecified joint: Secondary | ICD-10-CM

## 2018-03-26 DIAGNOSIS — M436 Torticollis: Secondary | ICD-10-CM

## 2018-03-26 DIAGNOSIS — G729 Myopathy, unspecified: Secondary | ICD-10-CM

## 2018-03-26 DIAGNOSIS — M791 Myalgia, unspecified site: Secondary | ICD-10-CM | POA: Diagnosis not present

## 2018-03-26 NOTE — Progress Notes (Signed)
SLEEP MEDICINE CLINIC   Provider:  Melvyn Novas, M D  Primary Care Physician:  Johny Blamer, MD   Referring Provider: Johny Blamer, MD    Chief Complaint  Patient presents with  . Follow-up    pt alone, rm 10. pt states DME [facility], pt in need of supplies. pt states that she is hurting all over in her bones. she needs a hip replacement and she is following with orthro. she states muscles are weak all over. this is all new to her and wanted to bring to attention.     HPI:  Rhonda Simmons is a 57 y.o. female , seen here as in a referral  from her optometrist , Pauline Aus , OD for  "terrible migraine headaches " and " I feel tired all the time". It was her eye doctor that saw her for sharp pains that seem to be located behind the eye, headaches, vision impairment this shadow and overlapping of visual field. All decreased acuity and dry eyes bilaterally. Medication list includes Cymbalta, Excedrin and Motrin.  I reviewed her ophthalmology records she has a corrected vision is 20/50 on the right, 20/30 on the right. She was suspected to develop astigmatism, hyperopia, presbyopia. Nuclear cataract at this time not mature enough for surgical procedure. Headaches which could be either related but the patient cannot have an MRI because of cochlear implants. And she has a small cyst at the temporal fovea.  Chief complaint according to patient : Rhonda Simmons reports that she has had headaches nearly every morning when she wakes up, associated with neck stiffness, nausea and at times vomiting. She is status post anterior cervical fusion and does have a titanium rod implanted. She has had headaches for a long time but not to this intensity, not of the same quality and not of the same frequency. She often feels as if there is pressure applied on the eyeball and she actually saw ophthalmology, and needs to return for visual field examination. She states that beginning cataracts were  suspected. She has TMJ and bruxism. She rarely is feeling well.  She has no energy to clean house, do anything. Takes Excedrin. She had undergone a spinal tap in high school and developed severe headaches, was given compazine - had a seizure at age 59- attributed to the medication . Lost awareness for 2-3 minutes.  She was diagnosed years ago with sleep apnea, not in our lab, she's not using CPAP.  I have known Rhonda Simmons over 10 years as her father's and mother's neurologist and I have also seen her daughter for headaches. 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  Sleep habits are as follows: goes to bed with headaches, but often after having slept on and off all afternoon.  The patient reports that she goes anytime between 11 PM and 2 AM to bed - she does not have an established routine.   Sleep medical history and family sleep history: Her father had advancing Alzheimer's dementia. Mother has TIA.   Social history:  Married, one daughter ( 67) . One son ( 24)  Rare ( one a week ) mountain dew , 2-3 iced tea a week. No tobacco use of any kind, seldomly alcohol -not  more than 2 or 3 times a year.   Interval History following her sleep studies, 03-26-2018. Rhonda Simmons reports good sleep on CPAP and her husband is  happy she doesn't snore. She reports myalgias ,all over soreness, discomfort, weakness - she cannot lift arms above shoulder, standing up from a seated position causes pain in hamstrings. Climbing stairs is difficult. Proximal myalgia- myositis? She has seen ortho PA, who ordered blood work . She is supposed to have those today at [facility].     DIAGNOSIS  - PSG from PSG on 06/11/17 which showed moderately-severe Obstructive Sleep Apnea (OSA) with overall AHI of 29.4/hr. No significant supine positional accentuation, and snoring was loudest in REM. The Wake baseline 02 saturation was 98%, with  the lowest being 82%. Time spent below 89% saturation equaled 12 minutes.Obstructive Sleep Apnea, responding well to only 7 cm water pressure, heated humidity and nasal pillow. The patient was fitted with a ResMed AirFit P10 (Small).Upper Airway Resistance Syndrome.   PLANS/RECOMMENDATIONS: CPAP was ordered. 7 cm water pressure, heated humidity and nasal pillow ResMed AirFit P10 (Small). DISCUSSION: CPAP compliance is defined as 4 hours or more of nightly use. Melvyn Novas, M.D.   08-07-2017   CPAP compliance was poor- she reports forgetting to put CPAP on, and sometimes puts the mask on for the last 2 hours of sleep, Compliance by days 93% and time only 68% average use time 5 hours 2 minutes, CPAP set at 7 cm, residual AHI 2.5/h good resolution.   She has a cochlear implant and I cannot order MRI> she has not been treated with statins, she has no dermatomal pain,and no rash ( no dermatomyositis)      Review of Systems: Out of a complete 14 system review, the patient complains of only the following symptoms, and all other reviewed systems are negative. Headaches improved, now myalgia. Not longer snoring. OSA diasgnosed. Epworth sleepiness Score  13/ 24, the  FSS at 44 points.     Social History   Socioeconomic History  . Marital status: Married    Spouse name: Not on file  . Number of children: 2  . Years of education: assoc  . Highest education level: Not on file  Occupational History  . Occupation: caretaker    Comment: for parents  Social Needs  . Financial resource strain: Not on file  . Food insecurity:    Worry: Not on file    Inability: Not on file  . Transportation needs:    Medical: Not on file    Non-medical: Not on file  Tobacco Use  . Smoking status: Never Smoker  . Smokeless tobacco: Never Used  Substance and Sexual Activity  . Alcohol use: Yes    Comment: occas  . Drug use: No  . Sexual activity: Not on file  Lifestyle  . Physical activity:    Days per  week: Not on file    Minutes per session: Not on file  . Stress: Not on file  Relationships  . Social connections:    Talks on phone: Not on file    Gets together: Not on file    Attends religious service: Not on file    Active member of club or organization: Not on file    Attends meetings of clubs or organizations: Not on file    Relationship status: Not on file  . Intimate partner violence:    Fear of current or ex partner: Not on file    Emotionally abused: Not on file    Physically abused: Not on file    Forced sexual activity: Not on file  Other Topics Concern  . Not on file  Social History Narrative  . Not on file    Family History  Problem Relation Age of Onset  . Alzheimer's disease Mother   . Alzheimer's disease Father     Past Medical History:  Diagnosis Date  . Chronic bronchitis    Dr. Maple Hudson  . Depression   . Hearing impaired   . 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 Hyperlipidemia   . Neuropathy     Past Surgical History:  Procedure Laterality Date  . INNER EAR SURGERY     cochlear implant - left  . NECK SURGERY      Current Outpatient Medications  Medication Sig Dispense Refill  . albuterol (PROAIR HFA) 108 (90 Base) MCG/ACT inhaler as needed.    . ALPRAZolam (XANAX XR) 1 MG 24 hr tablet Take 1 mg by mouth as needed.    . cephALEXin (KEFLEX) 500 MG capsule Take 1 capsule (500 mg total) by mouth 3 (three) times daily. 21 capsule 0  . DULoxetine (CYMBALTA) 60 MG capsule Take by mouth daily.  6  . EPINEPHrine (EPIPEN 2-PAK) 0.3 mg/0.3 mL IJ SOAJ injection Inject 0.3 mLs (0.3 mg total) into the muscle once as needed (for severe allergic reaction). CAll 911 immediately if you have to use this medicine 1 Device 0  . estradiol (ESTRACE VAGINAL) 0.1 MG/GM vaginal cream     . fluconazole (DIFLUCAN) 150 MG tablet Take 1 tablet (150 mg total) by mouth once. 1 tablet 1  . hydrOXYzine (ATARAX/VISTARIL) 25 MG tablet Take by mouth.    Marland Kitchen ibuprofen  (ADVIL,MOTRIN) 400 MG tablet Take 400 mg by mouth every 6 (six) hours as needed for pain.    . Lorcaserin HCl (BELVIQ) 10 MG TABS Take by mouth 2 (two) times daily.    Marland Kitchen oxyCODONE-acetaminophen (PERCOCET/ROXICET) 5-325 MG tablet     . phentermine (ADIPEX-P) 37.5 MG tablet Take 37.5 mg by mouth every morning.  3  . predniSONE (DELTASONE) 10 MG tablet Take 1 tablet (10 mg total) by mouth daily. 21 tablet 0  . topiramate (TOPAMAX) 50 MG tablet Take 1 tablet (50 mg total) by mouth 2 (two) times daily. 60 tablet 12  . zolpidem (AMBIEN) 10 MG tablet Take 10 mg by mouth at bedtime.  0  . estradiol (VIVELLE-DOT) 0.1 MG/24HR patch APPLY 1 PATCH 2 TIMES A WEEK  1   No current facility-administered medications for this visit.     Allergies as of 03/26/2018 - Review Complete 03/26/2018  Allergen Reaction Noted  . Compazine  10/26/2011  . Fexofenadine  10/27/2007  . Midol [aspirin-cinnamedrine-caffeine]  12/01/2012  . Naproxen sodium      Vitals: BP 134/86   Pulse 86   Ht 5\' 2"  (1.575 m)   Wt 227 lb (103 kg)   BMI 41.52 kg/m  Last Weight:  Wt Readings from Last 1 Encounters:  03/26/18 227 lb (103 kg)   POE:UMPN mass index is 41.52 kg/m.     Last Height:   Ht Readings from Last 1 Encounters:  03/26/18 5\' 2"  (1.575 m)    Physical exam:  General: The patient is awake, alert and appears not in acute distress. The patient is well groomed. Head: Normocephalic, atraumatic. Neck is supple. Mallampati 4,  neck circumference:17. Nasal airflow -congestion noted  . Retrognathia is seen.  Cardiovascular:  Regular rate and rhythm, without  murmurs or carotid bruit, and without distended neck veins. Respiratory: Lungs are clear to auscultation. Skin:  Without evidence of  edema, or rash Trunk: BMI is over 40 . The patient's posture is erect  Neurologic exam : The patient is awake and alert, oriented to place and time.   Memory subjective  described as intact.   Attention span & concentration  ability appears normal.  Speech is fluent,  without dysarthria, dysphonia or aphasia.  Mood and affect are appropriate.  Cranial nerves: Pupils are equal and briskly reactive to light.  Funduscopic exam without evidence of pallor or edema.  Extraocular movements  in vertical and horizontal planes intact and without nystagmus. Visual fields by finger perimetry are intact. Hearing impaired0 even with cochlear implant.  Facial sensation intact to fine touch.  Facial motor strength is symmetric and tongue and uvula move midline. Shoulder shrug was symmetrical.   Motor exam:  Passive range of motion of the neck as well as active neck motion retroflexion rotation and lateral bending all lead to a audible crackles. Crepitus. Normal tone, muscle bulk and symmetric strength in all extremities.  Sensory:  Fine touch, pinprick and vibration were tested in all extremities. Proprioception tested in the upper extremities was normal.  Coordination: Rapid alternating movements in the fingers/hands was normal. Finger-to-nose maneuver  normal without evidence of ataxia, dysmetria or tremor.  Gait and station: Patient walks without assistive device and is able unassisted to climb up to the exam table. Strength within normal limits. Stance is stable and normal.  Romberg testing is  negative. Deep tendon reflexes: in the upper and lower extremities are symmetric and intact. Babinski maneuver response is downgoing.    Assessment:  After physical and neurologic examination, review of laboratory studies,  Personal review of imaging studies, reports of other /same  Imaging studies, results of polysomnography and / or neurophysiology testing and pre-existing records as far as provided in visit., my assessment is   1) OSA diagnosed and treated on CPAP - sleep improved . She is less insomnic and less fatigued. 6 hours of average sleep time.    Rhonda Simmons's headaches have improved in frequency, intensity and  duration. She has headaches during the day she's associated these with nausea and sometimes vomiting.   2 )In addition -she reports  jaw pain, neck stiffness and limited range of motion at the neck attributed  to previous surgical procedures. She also has TMJ with a click. She has myalgia, and this is worsening.  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 She has a cochlear implant and I cannot order MRI. she has not been treated with statins, she has no dermatomal pain, and no rash ( no dermatomyositis).  I have takrn the liberty of ordering the rheumatic panel that was already ordered through [facility] and added 2 tests. These should be followed by rheumatology.    3)Obesity - she has seen abdominal surgeon Wenda Low, M.D, who recommended that she could undergo bariatric surgery. Depending on the sleep apnea evaluation results this may still be something she should pursue.     I spent more than 50 minutes of face to face time with the patient.  Greater than 50% of time was spent in counseling and coordination of care. We have discussed the diagnosis and differential and I answered the patient's questions.    Plan:  Treatment plan and additional workup :    Continue CPAP>  Added CBC diff  for eosinphilia , add CK CKMB, and sed rate, c reactive protein.    She has had a cervical spine image last over 3 years  ago. Had head CT in the last 6 month. Given her status post 3 level fusion as she reported, we should also reevaluate this part.She has a cochlear implant and I cannot order MRI> she has not been treated with statins, she has no dermatomal pain,and no rash ( no dermatomyositis)   I will also ask for records from [facility] at [facility] [employer], [address] and [place], [place] [address] phone number area code [phone] Dr. Doran Stabler, MD 03/26/2018, 10:46 AM  Certified in Neurology by ABPN Certified in Sleep Medicine by  [facility] [address], [address] [place], [place] [address]

## 2018-03-27 ENCOUNTER — Ambulatory Visit
Admission: RE | Admit: 2018-03-27 | Discharge: 2018-03-27 | Disposition: A | Discharge status: Home / Self Care | Payer: Federal, State, Local not specified - PPO | Source: Ambulatory Visit | Attending: Orthopedic Surgery | Admitting: Orthopedic Surgery

## 2018-03-27 DIAGNOSIS — M1612 Unilateral primary osteoarthritis, left hip: Secondary | ICD-10-CM

## 2018-03-28 LAB — CBC WITH DIFFERENTIAL/PLATELET
BASOS ABS: 0.1 10*3/uL (ref 0.0–0.2)
Basos: 1 %
EOS (ABSOLUTE): 0.3 10*3/uL (ref 0.0–0.4)
Eos: 5 %
Hematocrit: 38.7 % (ref 34.0–46.6)
Hemoglobin: 12.8 g/dL (ref 11.1–15.9)
Immature Grans (Abs): 0 10*3/uL (ref 0.0–0.1)
Immature Granulocytes: 0 %
LYMPHS ABS: 1.8 10*3/uL (ref 0.7–3.1)
Lymphs: 25 %
MCH: 29.8 pg (ref 26.6–33.0)
MCHC: 33.1 g/dL (ref 31.5–35.7)
MCV: 90 fL (ref 79–97)
MONOS ABS: 0.5 10*3/uL (ref 0.1–0.9)
Monocytes: 8 %
NEUTROS ABS: 4.4 10*3/uL (ref 1.4–7.0)
Neutrophils: 61 %
PLATELETS: 337 10*3/uL (ref 150–450)
RBC: 4.29 x10E6/uL (ref 3.77–5.28)
RDW: 12.7 % (ref 12.3–15.4)
WBC: 7.1 10*3/uL (ref 3.4–10.8)

## 2018-03-28 LAB — CK TOTAL AND CKMB (NOT AT ARMC): CK TOTAL: 72 U/L (ref 24–173)

## 2018-03-28 LAB — SEDIMENTATION RATE: Sed Rate: 6 mm/hr (ref 0–40)

## 2018-03-28 LAB — ANA: ANA: NEGATIVE

## 2018-03-28 LAB — CYCLIC CITRUL PEPTIDE ANTIBODY, IGG/IGA: CYCLIC CITRULLIN PEPTIDE AB: 3 U (ref 0–19)

## 2018-03-28 LAB — C-REACTIVE PROTEIN: CRP: 20 mg/L — ABNORMAL HIGH (ref 0–10)

## 2018-03-28 LAB — RHEUMATOID FACTOR: Rhuematoid fact SerPl-aCnc: <10 IU/mL (ref 0.0–13.9)

## 2018-03-31 ENCOUNTER — Telehealth: Payer: Self-pay | Admitting: Neurology

## 2018-03-31 NOTE — Telephone Encounter (Signed)
Pt has called so Dr Vickey Huger can be made aware that pt states over the weekend her pain has increased and she is hardly able to walk.  Pt would very much like a call back as soon as possible

## 2018-03-31 NOTE — Telephone Encounter (Signed)
Called the pt with lab work and reviewed with the patient this concern at the time of that call. Pt should establish with a rheumatologist per Dr Vickey Huger.

## 2018-03-31 NOTE — Telephone Encounter (Signed)
Called the patient and reviewed the patients lab work with her. Pt verbalized understanding. I have informed her that Dr Vickey Huger is recommending the patient see a rheumatologist. Pt states that she has not followed with a rheumatologist before. I have instructed the patient to call her PCP and see if they can place this referral for her. Informed the patient if she has issues we can place but would like to see if her PCP can do that first. Pt verbalized understanding.

## 2018-03-31 NOTE — Telephone Encounter (Signed)
-----   Message from Melvyn Novas, MD sent at 03/27/2018  5:10 PM EDT ----- Only c reactive protein, an unspecific inflammatory marker, was elevated.  No eosinophilia, high CKMB,no positive RF or ANA, normal sed rate.  Please share with patients orthopedist

## 2018-04-01 DIAGNOSIS — M7062 Trochanteric bursitis, left hip: Secondary | ICD-10-CM | POA: Diagnosis not present

## 2018-04-01 DIAGNOSIS — M1612 Unilateral primary osteoarthritis, left hip: Secondary | ICD-10-CM | POA: Diagnosis not present

## 2018-04-02 ENCOUNTER — Encounter: Payer: Self-pay | Admitting: Neurology

## 2018-04-02 DIAGNOSIS — M2550 Pain in unspecified joint: Secondary | ICD-10-CM | POA: Insufficient documentation

## 2018-04-02 DIAGNOSIS — G729 Myopathy, unspecified: Secondary | ICD-10-CM | POA: Insufficient documentation

## 2018-04-02 DIAGNOSIS — M791 Myalgia, unspecified site: Secondary | ICD-10-CM | POA: Insufficient documentation

## 2018-04-02 DIAGNOSIS — M436 Torticollis: Secondary | ICD-10-CM | POA: Insufficient documentation

## 2018-04-02 NOTE — Patient Instructions (Signed)

## 2018-04-03 DIAGNOSIS — G4733 Obstructive sleep apnea (adult) (pediatric): Secondary | ICD-10-CM | POA: Diagnosis not present

## 2018-04-14 ENCOUNTER — Other Ambulatory Visit: Payer: Self-pay | Admitting: Neurology

## 2018-04-14 DIAGNOSIS — M436 Torticollis: Secondary | ICD-10-CM

## 2018-04-14 DIAGNOSIS — M791 Myalgia, unspecified site: Secondary | ICD-10-CM

## 2018-04-14 DIAGNOSIS — M2550 Pain in unspecified joint: Secondary | ICD-10-CM

## 2018-04-14 DIAGNOSIS — G729 Myopathy, unspecified: Secondary | ICD-10-CM

## 2018-04-14 NOTE — Telephone Encounter (Signed)
Order for referral to rheumatology was placed.

## 2018-04-14 NOTE — Telephone Encounter (Signed)
Pt called stating her PCP will not place a referral unless they see her first but he was out of the office for a week and is backing up in a appts. Requesting Dr. Vickey Huger send over something to PCP or place the referral

## 2018-04-17 DIAGNOSIS — R51 Headache: Secondary | ICD-10-CM | POA: Diagnosis not present

## 2018-04-17 DIAGNOSIS — R52 Pain, unspecified: Secondary | ICD-10-CM | POA: Diagnosis not present

## 2018-04-24 ENCOUNTER — Encounter: Payer: Self-pay | Admitting: Podiatry

## 2018-04-24 ENCOUNTER — Other Ambulatory Visit: Payer: Self-pay | Admitting: Podiatry

## 2018-04-24 ENCOUNTER — Ambulatory Visit (INDEPENDENT_AMBULATORY_CARE_PROVIDER_SITE_OTHER): Payer: Federal, State, Local not specified - PPO

## 2018-04-24 ENCOUNTER — Ambulatory Visit: Payer: Federal, State, Local not specified - PPO | Admitting: Podiatry

## 2018-04-24 ENCOUNTER — Encounter

## 2018-04-24 DIAGNOSIS — M79671 Pain in right foot: Secondary | ICD-10-CM

## 2018-04-24 DIAGNOSIS — M7751 Other enthesopathy of right foot: Secondary | ICD-10-CM | POA: Diagnosis not present

## 2018-04-24 DIAGNOSIS — M722 Plantar fascial fibromatosis: Secondary | ICD-10-CM | POA: Diagnosis not present

## 2018-04-24 DIAGNOSIS — M7731 Calcaneal spur, right foot: Secondary | ICD-10-CM | POA: Diagnosis not present

## 2018-04-24 DIAGNOSIS — M216X9 Other acquired deformities of unspecified foot: Secondary | ICD-10-CM

## 2018-04-24 NOTE — Progress Notes (Signed)
Subjective:  Patient ID: Rhonda Simmons, female    DOB: May 19, 1961,  MRN: [id number]  Chief Complaint  Patient presents with  . Foot Pain    right foot bottom of heel; pt stated, "pain started a couple of months ago, noticed foot started going flat and turning inward"    57 y.o. female presents with the above complaint.  Reports pain to the bottom of the right heel  Review of Systems: Negative except as noted in the HPI. Denies N/V/F/Ch.  Past Medical History:  Diagnosis Date  . Chronic bronchitis    Dr. Maple Hudson  . Depression   . Hearing impaired   . 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 Hyperlipidemia   . Neuropathy     Current Outpatient Medications:  .  albuterol (PROAIR HFA) 108 (90 Base) MCG/ACT inhaler, as needed., Disp: , Rfl:  .  ALPRAZolam (XANAX XR) 1 MG 24 hr tablet, Take 1 mg by mouth as needed., Disp: , Rfl:  .  cephALEXin (KEFLEX) 500 MG capsule, Take 1 capsule (500 mg total) by mouth 3 (three) times daily., Disp: 21 capsule, Rfl: 0 .  doxycycline (VIBRA-TABS) 100 MG tablet, Take 100 mg by mouth daily., Disp: , Rfl: 3 .  DULoxetine (CYMBALTA) 60 MG capsule, Take by mouth daily., Disp: , Rfl: 6 .  EPINEPHrine (EPIPEN 2-PAK) 0.3 mg/0.3 mL IJ SOAJ injection, Inject 0.3 mLs (0.3 mg total) into the muscle once as needed (for severe allergic reaction). CAll 911 immediately if you have to use this medicine, Disp: 1 Device, Rfl: 0 .  estradiol (ESTRACE VAGINAL) 0.1 MG/GM vaginal cream, , Disp: , Rfl:  .  estradiol (VIVELLE-DOT) 0.1 MG/24HR patch, APPLY 1 PATCH 2 TIMES A WEEK, Disp: , Rfl: 1 .  hydrOXYzine (ATARAX/VISTARIL) 25 MG tablet, Take by mouth., Disp: , Rfl:  .  ibuprofen (ADVIL,MOTRIN) 400 MG tablet, Take 400 mg by mouth every 6 (six) hours as needed for pain., Disp: , Rfl:  .  oxyCODONE-acetaminophen (PERCOCET/ROXICET) 5-325 MG tablet, , Disp: , Rfl:  .  phentermine (ADIPEX-P) 37.5 MG tablet, Take 37.5 mg by mouth every morning., Disp: , Rfl: 3 .  predniSONE  (DELTASONE) 10 MG tablet, Take 1 tablet (10 mg total) by mouth daily., Disp: 21 tablet, Rfl: 0 .  topiramate (TOPAMAX) 50 MG tablet, Take 1 tablet (50 mg total) by mouth 2 (two) times daily., Disp: 60 tablet, Rfl: 12 .  zolpidem (AMBIEN) 10 MG tablet, Take 10 mg by mouth at bedtime., Disp: , Rfl: 0 .  fluconazole (DIFLUCAN) 150 MG tablet, Take 1 tablet (150 mg total) by mouth once., Disp: 1 tablet, Rfl: 1 .  Lorcaserin HCl (BELVIQ) 10 MG TABS, Take by mouth 2 (two) times daily., Disp: , Rfl:   Social History   Tobacco Use  Smoking Status Never Smoker  Smokeless Tobacco Never Used    Allergies  Allergen Reactions  . Compazine     convulsions  . Fexofenadine     D version makes headaches worse  . Midol [Aspirin-Cinnamedrine-Caffeine]     unknown  . Naproxen Sodium     unknown   Objective:  There were no vitals filed for this visit. There is no height or weight on file to calculate BMI. Constitutional Well developed. Well nourished.  Vascular Dorsalis pedis pulses palpable bilaterally. Posterior tibial pulses palpable bilaterally. Capillary refill normal to all digits.  No cyanosis or clubbing noted. Pedal hair growth normal.  Neurologic Normal speech. Oriented  to person, place, and time. Epicritic sensation to light touch grossly present bilaterally.  Dermatologic Nails well groomed and normal in appearance. No open wounds. No skin lesions.  Orthopedic: Normal joint ROM without pain or crepitus bilaterally. No visible deformities. Tender to palpation at the calcaneal tuber right. No pain with calcaneal squeeze right. Ankle ROM diminished range of motion right. Silfverskiold Test: positive right.   Radiographs: Taken and reviewed.  No acute fracture dislocation.  Plantar and posterior heel spur Assessment:   1. Plantar fasciitis   2. Equinus deformity of foot   3. Heel spur, right   4. Bursitis of heel, right    Plan:  Patient was evaluated and treated and all  questions answered.  Plantar Fasciitis, right - XR reviewed as above.  - Educated on icing and stretching. Instructions given.  - Injection delivered to the plantar fascia as below. - DME: Night splint dispensed. - Pharmacologic management: OTC NSAIDs  Procedure: Injection Tendon/Ligament Location: Right plantar fascia at the glabrous junction; medial approach. Skin Prep: alcohol Injectate: 1 cc 0.5% marcaine plain, 1 cc dexamethasone phosphate, 0.5 cc kenalog 10. Disposition: Patient tolerated procedure well. Injection site dressed with a band-aid.  Return in about 3 weeks (around 05/15/2018) for Plantar fasciitis.

## 2018-04-28 ENCOUNTER — Telehealth: Payer: Self-pay | Admitting: *Deleted

## 2018-04-28 NOTE — Telephone Encounter (Signed)
Pt states the front desk said you called.

## 2018-04-30 ENCOUNTER — Other Ambulatory Visit: Payer: Federal, State, Local not specified - PPO | Admitting: Orthotics

## 2018-05-02 DIAGNOSIS — I10 Essential (primary) hypertension: Secondary | ICD-10-CM | POA: Diagnosis not present

## 2018-05-02 DIAGNOSIS — M1612 Unilateral primary osteoarthritis, left hip: Secondary | ICD-10-CM | POA: Diagnosis not present

## 2018-05-02 DIAGNOSIS — H9203 Otalgia, bilateral: Secondary | ICD-10-CM | POA: Diagnosis not present

## 2018-05-02 DIAGNOSIS — R52 Pain, unspecified: Secondary | ICD-10-CM | POA: Diagnosis not present

## 2018-05-06 DIAGNOSIS — M2550 Pain in unspecified joint: Secondary | ICD-10-CM | POA: Diagnosis not present

## 2018-05-15 ENCOUNTER — Other Ambulatory Visit: Payer: Federal, State, Local not specified - PPO | Admitting: Orthotics

## 2018-05-15 ENCOUNTER — Ambulatory Visit: Payer: Federal, State, Local not specified - PPO | Admitting: Podiatry

## 2018-05-15 DIAGNOSIS — M792 Neuralgia and neuritis, unspecified: Secondary | ICD-10-CM

## 2018-05-15 DIAGNOSIS — M722 Plantar fascial fibromatosis: Secondary | ICD-10-CM

## 2018-05-15 NOTE — Patient Instructions (Signed)

## 2018-05-15 NOTE — Progress Notes (Signed)
Subjective:  Patient ID: Rhonda Simmons, female    DOB: 05/26/1961,  MRN: [id number]  Chief Complaint  Patient presents with  . Plantar Fasciitis    right heel, not any better    57 y.o. female presents with the above complaint. Only got relief for a few days. Not much improvement.  Review of Systems: Negative except as noted in the HPI. Denies N/V/F/Ch.  Past Medical History:  Diagnosis Date  . Chronic bronchitis    Dr. Maple Hudson  . Depression   . Hearing impaired   . 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 Hyperlipidemia   . Neuropathy     Current Outpatient Medications:  .  albuterol (PROAIR HFA) 108 (90 Base) MCG/ACT inhaler, as needed., Disp: , Rfl:  .  ALPRAZolam (XANAX XR) 1 MG 24 hr tablet, Take 1 mg by mouth as needed., Disp: , Rfl:  .  cephALEXin (KEFLEX) 500 MG capsule, Take 1 capsule (500 mg total) by mouth 3 (three) times daily., Disp: 21 capsule, Rfl: 0 .  doxycycline (VIBRA-TABS) 100 MG tablet, Take 100 mg by mouth daily., Disp: , Rfl: 3 .  DULoxetine (CYMBALTA) 60 MG capsule, Take by mouth daily., Disp: , Rfl: 6 .  EPINEPHrine (EPIPEN 2-PAK) 0.3 mg/0.3 mL IJ SOAJ injection, Inject 0.3 mLs (0.3 mg total) into the muscle once as needed (for severe allergic reaction). CAll 911 immediately if you have to use this medicine, Disp: 1 Device, Rfl: 0 .  estradiol (ESTRACE VAGINAL) 0.1 MG/GM vaginal cream, , Disp: , Rfl:  .  estradiol (VIVELLE-DOT) 0.1 MG/24HR patch, APPLY 1 PATCH 2 TIMES A WEEK, Disp: , Rfl: 1 .  fluconazole (DIFLUCAN) 150 MG tablet, Take 1 tablet (150 mg total) by mouth once., Disp: 1 tablet, Rfl: 1 .  hydrOXYzine (ATARAX/VISTARIL) 25 MG tablet, Take by mouth., Disp: , Rfl:  .  ibuprofen (ADVIL,MOTRIN) 400 MG tablet, Take 400 mg by mouth every 6 (six) hours as needed for pain., Disp: , Rfl:  .  Lorcaserin HCl (BELVIQ) 10 MG TABS, Take by mouth 2 (two) times daily., Disp: , Rfl:  .  oxyCODONE-acetaminophen (PERCOCET/ROXICET) 5-325 MG tablet, , Disp: ,  Rfl:  .  phentermine (ADIPEX-P) 37.5 MG tablet, Take 37.5 mg by mouth every morning., Disp: , Rfl: 3 .  predniSONE (DELTASONE) 10 MG tablet, Take 1 tablet (10 mg total) by mouth daily., Disp: 21 tablet, Rfl: 0 .  topiramate (TOPAMAX) 50 MG tablet, Take 1 tablet (50 mg total) by mouth 2 (two) times daily., Disp: 60 tablet, Rfl: 12 .  zolpidem (AMBIEN) 10 MG tablet, Take 10 mg by mouth at bedtime., Disp: , Rfl: 0  Social History   Tobacco Use  Smoking Status Never Smoker  Smokeless Tobacco Never Used    Allergies  Allergen Reactions  . Compazine     convulsions  . Fexofenadine     D version makes headaches worse  . Midol [Aspirin-Cinnamedrine-Caffeine]     unknown  . Naproxen Sodium     unknown   Objective:  There were no vitals filed for this visit. There is no height or weight on file to calculate BMI. Constitutional Well developed. Well nourished.  Vascular Dorsalis pedis pulses palpable bilaterally. Posterior tibial pulses palpable bilaterally. Capillary refill normal to all digits.  No cyanosis or clubbing noted. Pedal hair growth normal.  Neurologic Normal speech. Oriented to person, place, and time. Epicritic sensation to light touch grossly present bilaterally.  Dermatologic Nails well  groomed and normal in appearance. No open wounds. No skin lesions.  Orthopedic: Normal joint ROM without pain or crepitus bilaterally. No visible deformities. Tender to palpation at the calcaneal tuber right. No pain with calcaneal squeeze right. Ankle ROM diminished range of motion right. Silfverskiold Test: positive right.  POP about the medial aspect of the calcaneus.   Assessment:   1. Plantar fasciitis   2. Neuralgia and neuritis    Plan:  Patient was evaluated and treated and all questions answered.  Plantar Fasciitis, right - Educated on icing and stretching. Instructions given.  - Injection delivered to the plantar fascia as below. - DME: PF Rest  brace  Procedure: Injection Tendon/Ligament Consent: Verbal consent obtained. Location: Right plantar fascia at the glabrous junction; medial approach. Skin Prep: Alcohol. Injectate: 1 cc 0.5% marcaine plain, 1 cc dexamethasone phosphate, 0.5 cc kenalog 10. Disposition: Patient tolerated procedure well. Injection site dressed with a band-aid.  Baxter's Neuritis -Injection as below.  Procedure: Nerve Injection Location: Right baxter's nerve Skin Prep: Alcohol. Injectate: 0.5 cc 1% lidocaine plain, 0.5 cc dexamethasone phosphate. Disposition: Patient tolerated procedure well. Injection site dressed with a band-aid.    Return in about 3 weeks (around 06/05/2018) for Plantar fasciitis, Right.

## 2018-06-02 DIAGNOSIS — M2550 Pain in unspecified joint: Secondary | ICD-10-CM | POA: Diagnosis not present

## 2018-06-10 DIAGNOSIS — F324 Major depressive disorder, single episode, in partial remission: Secondary | ICD-10-CM | POA: Diagnosis not present

## 2018-06-10 DIAGNOSIS — E782 Mixed hyperlipidemia: Secondary | ICD-10-CM | POA: Diagnosis not present

## 2018-06-10 DIAGNOSIS — M353 Polymyalgia rheumatica: Secondary | ICD-10-CM | POA: Diagnosis not present

## 2018-06-10 DIAGNOSIS — F419 Anxiety disorder, unspecified: Secondary | ICD-10-CM | POA: Diagnosis not present

## 2018-06-12 ENCOUNTER — Encounter: Payer: Self-pay | Admitting: Podiatry

## 2018-06-12 ENCOUNTER — Ambulatory Visit: Payer: Federal, State, Local not specified - PPO | Admitting: Podiatry

## 2018-06-12 ENCOUNTER — Ambulatory Visit (INDEPENDENT_AMBULATORY_CARE_PROVIDER_SITE_OTHER): Payer: Federal, State, Local not specified - PPO | Admitting: Orthotics

## 2018-06-12 DIAGNOSIS — M722 Plantar fascial fibromatosis: Secondary | ICD-10-CM

## 2018-06-12 DIAGNOSIS — M216X9 Other acquired deformities of unspecified foot: Secondary | ICD-10-CM

## 2018-06-12 DIAGNOSIS — M069 Rheumatoid arthritis, unspecified: Secondary | ICD-10-CM

## 2018-06-12 DIAGNOSIS — M792 Neuralgia and neuritis, unspecified: Secondary | ICD-10-CM

## 2018-06-12 NOTE — Progress Notes (Signed)
Needs to be sent back, shell too far distal.

## 2018-06-12 NOTE — Progress Notes (Signed)
Subjective:  Patient ID: Rhonda Simmons, female    DOB: 1961-02-03,  MRN: [id number]  Chief Complaint  Patient presents with  . Plantar Fasciitis    right foot is still good some days, not so good others, Prednisone prescribed by pcp helps some    57 y.o. female presents with the above complaint.  States that the foot is doing well some days and having more pain on other days.  States that she is currently on prednisone for recent diagnosis of polymyalgia rheumatica.  Review of Systems: Negative except as noted in the HPI. Denies N/V/F/Ch.  Past Medical History:  Diagnosis Date  . Chronic bronchitis    Dr. Maple Hudson  . Depression   . Hearing impaired   . 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 Hyperlipidemia   . Neuropathy     Current Outpatient Medications:  .  albuterol (PROAIR HFA) 108 (90 Base) MCG/ACT inhaler, as needed., Disp: , Rfl:  .  ALPRAZolam (XANAX) 0.25 MG tablet, , Disp: , Rfl:  .  cephALEXin (KEFLEX) 500 MG capsule, Take 1 capsule (500 mg total) by mouth 3 (three) times daily., Disp: 21 capsule, Rfl: 0 .  doxycycline (VIBRA-TABS) 100 MG tablet, Take 100 mg by mouth daily., Disp: , Rfl: 3 .  DULoxetine (CYMBALTA) 60 MG capsule, Take by mouth daily., Disp: , Rfl: 6 .  EPINEPHrine (EPIPEN 2-PAK) 0.3 mg/0.3 mL IJ SOAJ injection, Inject 0.3 mLs (0.3 mg total) into the muscle once as needed (for severe allergic reaction). CAll 911 immediately if you have to use this medicine, Disp: 1 Device, Rfl: 0 .  estradiol (ESTRACE VAGINAL) 0.1 MG/GM vaginal cream, , Disp: , Rfl:  .  estradiol (VIVELLE-DOT) 0.1 MG/24HR patch, APPLY 1 PATCH 2 TIMES A WEEK, Disp: , Rfl: 1 .  fluconazole (DIFLUCAN) 150 MG tablet, Take 1 tablet (150 mg total) by mouth once., Disp: 1 tablet, Rfl: 1 .  hydrOXYzine (ATARAX/VISTARIL) 25 MG tablet, Take by mouth., Disp: , Rfl:  .  ibuprofen (ADVIL,MOTRIN) 400 MG tablet, Take 400 mg by mouth every 6 (six) hours as needed for pain., Disp: , Rfl:  .   Lorcaserin HCl (BELVIQ) 10 MG TABS, Take by mouth 2 (two) times daily., Disp: , Rfl:  .  nitrofurantoin, macrocrystal-monohydrate, (MACROBID) 100 MG capsule, TAKE 1 CAPSULE BY MOUTH TWICE A DAY UNTIL FINISHED, Disp: , Rfl: 0 .  oxyCODONE-acetaminophen (PERCOCET/ROXICET) 5-325 MG tablet, , Disp: , Rfl:  .  phentermine (ADIPEX-P) 37.5 MG tablet, Take 37.5 mg by mouth every morning., Disp: , Rfl: 3 .  predniSONE (DELTASONE) 5 MG tablet, , Disp: , Rfl:  .  topiramate (TOPAMAX) 50 MG tablet, Take 1 tablet (50 mg total) by mouth 2 (two) times daily., Disp: 60 tablet, Rfl: 12 .  zolpidem (AMBIEN) 10 MG tablet, Take 10 mg by mouth at bedtime., Disp: , Rfl: 0  Social History   Tobacco Use  Smoking Status Never Smoker  Smokeless Tobacco Never Used    Allergies  Allergen Reactions  . Compazine     convulsions  . Fexofenadine     D version makes headaches worse  . Midol [Aspirin-Cinnamedrine-Caffeine]     unknown  . Naproxen Sodium     unknown   Objective:  There were no vitals filed for this visit. There is no height or weight on file to calculate BMI. Constitutional Well developed. Well nourished.  Vascular Dorsalis pedis pulses palpable bilaterally. Posterior tibial pulses palpable bilaterally. Capillary  refill normal to all digits.  No cyanosis or clubbing noted. Pedal hair growth normal.  Neurologic Normal speech. Oriented to person, place, and time. Epicritic sensation to light touch grossly present bilaterally.  Dermatologic Nails well groomed and normal in appearance. No open wounds. No skin lesions.  Orthopedic: Normal joint ROM without pain or crepitus bilaterally. No visible deformities. Tender to palpation at the calcaneal tuber right. No pain with calcaneal squeeze right. Ankle ROM diminished range of motion right. Silfverskiold Test: positive right.  POP about the medial aspect of the calcaneus.   Assessment:   No diagnosis found. Plan:  Patient was evaluated  and treated and all questions answered.  Plantar Fasciitis, right -We will hold off injection today as patient is currently on prednisone. -Await orthotics, states that orthotics were attempted to be dispensed today but were the wrong size and sent for re-make.  Return in about 4 weeks (around 07/10/2018) for Plantar fasciitis, Right.

## 2018-06-25 DIAGNOSIS — H5310 Unspecified subjective visual disturbances: Secondary | ICD-10-CM | POA: Diagnosis not present

## 2018-06-25 DIAGNOSIS — H25093 Other age-related incipient cataract, bilateral: Secondary | ICD-10-CM | POA: Diagnosis not present

## 2018-06-25 DIAGNOSIS — H5371 Glare sensitivity: Secondary | ICD-10-CM | POA: Diagnosis not present

## 2018-06-26 ENCOUNTER — Encounter: Payer: Federal, State, Local not specified - PPO | Admitting: Orthotics

## 2018-08-05 DIAGNOSIS — H25093 Other age-related incipient cataract, bilateral: Secondary | ICD-10-CM | POA: Diagnosis not present

## 2018-08-05 DIAGNOSIS — H5371 Glare sensitivity: Secondary | ICD-10-CM | POA: Diagnosis not present

## 2018-08-05 DIAGNOSIS — H5310 Unspecified subjective visual disturbances: Secondary | ICD-10-CM | POA: Diagnosis not present

## 2018-08-08 DIAGNOSIS — H40013 Open angle with borderline findings, low risk, bilateral: Secondary | ICD-10-CM | POA: Diagnosis not present

## 2018-08-08 DIAGNOSIS — H04123 Dry eye syndrome of bilateral lacrimal glands: Secondary | ICD-10-CM | POA: Diagnosis not present

## 2018-08-08 DIAGNOSIS — H35351 Cystoid macular degeneration, right eye: Secondary | ICD-10-CM | POA: Diagnosis not present

## 2018-08-08 DIAGNOSIS — R51 Headache: Secondary | ICD-10-CM | POA: Diagnosis not present

## 2018-08-14 DIAGNOSIS — G4733 Obstructive sleep apnea (adult) (pediatric): Secondary | ICD-10-CM | POA: Diagnosis not present

## 2018-08-14 DIAGNOSIS — M353 Polymyalgia rheumatica: Secondary | ICD-10-CM | POA: Diagnosis not present

## 2018-08-14 DIAGNOSIS — M2550 Pain in unspecified joint: Secondary | ICD-10-CM | POA: Diagnosis not present

## 2018-09-25 DIAGNOSIS — M2550 Pain in unspecified joint: Secondary | ICD-10-CM | POA: Diagnosis not present

## 2018-09-25 DIAGNOSIS — R5382 Chronic fatigue, unspecified: Secondary | ICD-10-CM | POA: Diagnosis not present

## 2018-09-25 DIAGNOSIS — M353 Polymyalgia rheumatica: Secondary | ICD-10-CM | POA: Diagnosis not present

## 2018-10-10 ENCOUNTER — Telehealth: Payer: Self-pay | Admitting: Neurology

## 2018-10-10 ENCOUNTER — Encounter: Payer: Self-pay | Admitting: Neurology

## 2018-10-10 NOTE — Telephone Encounter (Signed)
Pt called stating that she saw her Rheumotologist and they told her she had a possible PMI. Pt has seen Dr. Vickey Huger for sleep and now she would like to see her for the PMI. Please advise.

## 2018-10-13 NOTE — Telephone Encounter (Signed)
Called the patient back. There was no answer. Called the patient cause I'm not exactly sure what she is wanting to be seen for. I wanted to verify this was a neurology concern before scheduling an apt. I also had an opening on wed I informed I was calling from wait list. LVM for the pt to call back and we can  1) make sure it is a neurology concern cause I'm not sure what PMI is. 2) schedule patient a apt if is a neurology concern and possibly if cancellation is still available work on that slot.

## 2018-10-14 ENCOUNTER — Encounter: Payer: Self-pay | Admitting: Neurology

## 2018-10-16 ENCOUNTER — Ambulatory Visit: Payer: Federal, State, Local not specified - PPO | Admitting: Neurology

## 2018-10-16 ENCOUNTER — Encounter: Payer: Self-pay | Admitting: Neurology

## 2018-10-16 VITALS — BP 125/96 | HR 91 | Ht 62.0 in | Wt 242.0 lb

## 2018-10-16 DIAGNOSIS — E669 Obesity, unspecified: Secondary | ICD-10-CM | POA: Diagnosis not present

## 2018-10-16 DIAGNOSIS — G4733 Obstructive sleep apnea (adult) (pediatric): Secondary | ICD-10-CM | POA: Diagnosis not present

## 2018-10-16 DIAGNOSIS — R208 Other disturbances of skin sensation: Secondary | ICD-10-CM

## 2018-10-16 DIAGNOSIS — Z9989 Dependence on other enabling machines and devices: Secondary | ICD-10-CM

## 2018-10-16 DIAGNOSIS — H903 Sensorineural hearing loss, bilateral: Secondary | ICD-10-CM | POA: Diagnosis not present

## 2018-10-16 MED ORDER — PREDNISONE 10 MG PO TABS: ORAL_TABLET | ORAL | 0 refills | Status: DC

## 2018-10-16 NOTE — Addendum Note (Signed)
Addended by: Melvyn Novas on: 10/16/2018 04:35 PM   Modules accepted: Orders

## 2018-10-16 NOTE — Patient Instructions (Signed)
Myofascial Pain Syndrome  Myofascial pain syndrome and fibromyalgia are both pain disorders. This pain may be felt mainly in your muscles.  Myofascial pain syndrome: ? Always has tender points in the muscle that will cause pain when pressed (trigger points). The pain may come and go. ? Usually affects your neck, upper back, and shoulder areas. The pain often radiates into your arms and hands.  Fibromyalgia: ? Has muscle pains and tenderness that come and go. ? Is often associated with fatigue and sleep problems. ? Has trigger points. ? Tends to be long-lasting (chronic), but is not life-threatening. Fibromyalgia and myofascial pain syndrome are not the same. However, they often occur together. If you have both conditions, each can make the other worse. Both are common and can cause enough pain and fatigue to make day-to-day activities difficult. Both can be hard to diagnose because their symptoms are common in many other conditions. What are the causes? The exact causes of these conditions are not known. What increases the risk? You are more likely to develop this condition if:  You have a family history of the condition.  You have certain triggers, such as: ? Spine disorders. ? An injury (trauma) or other physical stressors. ? Being under a lot of stress. ? Medical conditions such as osteoarthritis, rheumatoid arthritis, or lupus. What are the signs or symptoms? Fibromyalgia The main symptom of fibromyalgia is widespread pain and tenderness in your muscles. Pain is sometimes described as stabbing, shooting, or burning. You may also have:  Tingling or numbness.  Sleep problems and fatigue.  Problems with attention and concentration (fibro fog). Other symptoms may include:  Bowel and bladder problems.  Headaches.  Visual problems.  Problems with odors and noises.  Depression or mood changes.  Painful menstrual periods (dysmenorrhea).  Dry skin or eyes. These symptoms  can vary over time. Myofascial pain syndrome Symptoms of myofascial pain syndrome include:  Tight, ropy bands of muscle.  Uncomfortable sensations in muscle areas. These may include aching, cramping, burning, numbness, tingling, and weakness.  Difficulty moving certain parts of the body freely (poor range of motion). How is this diagnosed? This condition may be diagnosed by your symptoms and medical history. You will also have a physical exam. In general:  Fibromyalgia is diagnosed if you have pain, fatigue, and other symptoms for more than 3 months, and symptoms cannot be explained by another condition.  Myofascial pain syndrome is diagnosed if you have trigger points in your muscles, and those trigger points are tender and cause pain elsewhere in your body (referred pain). How is this treated? Treatment for these conditions depends on the type that you have.  For fibromyalgia: ? Pain medicines, such as NSAIDs. ? Medicines for treating depression. ? Medicines for treating seizures. ? Medicines that relax the muscles.  For myofascial pain: ? Pain medicines, such as NSAIDs. ? Cooling and stretching of muscles. ? Trigger point injections. ? Sound wave (ultrasound) treatments to stimulate muscles. Treating these conditions often requires a team of health care providers. These may include:  Your primary care provider.  Physical therapist.  Complementary health care providers, such as massage therapists or acupuncturists.  Psychiatrist for cognitive behavioral therapy. Follow these instructions at home: Medicines  Take over-the-counter and prescription medicines only as told by your health care provider.  Do not drive or use heavy machinery while taking prescription pain medicine.  If you are taking prescription pain medicine, take actions to prevent or treat constipation. Your health care provider  may recommend that you: ? Drink enough fluid to keep your urine pale  yellow. ? Eat foods that are high in fiber, such as fresh fruits and vegetables, whole grains, and beans. ? Limit foods that are high in fat and processed sugars, such as fried or sweet foods. ? Take an over-the-counter or prescription medicine for constipation. Lifestyle   Exercise as directed by your health care provider or physical therapist.  Practice relaxation techniques to control your stress. You may want to try: ? Biofeedback. ? Visual imagery. ? Hypnosis. ? Muscle relaxation. ? Yoga. ? Meditation.  Maintain a healthy lifestyle. This includes eating a healthy diet and getting enough sleep.  Do not use any products that contain nicotine or tobacco, such as cigarettes and e-cigarettes. If you need help quitting, ask your health care provider. General instructions  Talk to your health care provider about complementary treatments, such as acupuncture or massage.  Consider joining a support group with others who are diagnosed with this condition.  Do not do activities that stress or strain your muscles. This includes repetitive motions and heavy lifting.  Keep all follow-up visits as told by your health care provider. This is important. Where to find more information  National Fibromyalgia Association: www.fmaware.org  Arthritis Foundation: www.arthritis.org  American Chronic Pain Association: www.theacpa.org Contact a health care provider if:  You have new symptoms.  Your symptoms get worse or your pain is severe.  You have side effects from your medicines.  You have trouble sleeping.  Your condition is causing depression or anxiety. Summary  Myofascial pain syndrome and fibromyalgia are pain disorders.  Myofascial pain syndrome has tender points in the muscle that will cause pain when pressed (trigger points). Fibromyalgia also has muscle pains and tenderness that come and go, but this condition is often associated with fatigue and sleep  disturbances.  Fibromyalgia and myofascial pain syndrome are not the same but often occur together, causing pain and fatigue that make day-to-day activities difficult.  Treatment for fibromyalgia includes taking medicines to relax the muscles and medicines for pain, depression, or seizures. Treatment for myofascial pain syndrome includes taking medicines for pain, cooling and stretching of muscles, and injecting medicines into trigger points.  Follow your health care provider's instructions for taking medicines and maintaining a healthy lifestyle. This information is not intended to replace advice given to you by your health care provider. Make sure you discuss any questions you have with your health care provider. Document Released: 08/20/2005 Document Revised: 09/04/2017 Document Reviewed: 09/04/2017 [employer] Interactive Patient Education  2019 [employer]

## 2018-10-16 NOTE — Progress Notes (Signed)
SLEEP MED[facility]INE CLINIC   Provider:  Melvyn Novasarmen  Rhonda Simmons, M D  Primary Care Physician:  Rhonda BlamerHarris, William, MD   Referring Provider: Johny BlamerHarris, William, MD    Chief Complaint  Patient presents with  . Follow-up    pt states she is still struggling with pain. she saw her PCP and was placed on prednisone, referred to Rheumatology. her Rheumatology she was kept on prednisone and she has been off  january, while the pt was on the prednisone her pains were gone, since off the prednisone she states pains have returned and  hurts all over her body. the rheumatologist doesnt want her to keep taking the predisone and the pt doesnt either but she doesnt understand why pain still here.   . Other    pt states she has not followed back with PCP since all this has returned, she wanted to address here first and see what Dr Vickey Hugerohmeier thinks. CPAP working well [facility]    HPI: I have the pleasure of meeting today with Rhonda Simmons, a 58 year old longtime established female patient.  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  She has a lifelong hearing deficit.  She has a history of waxing and waning migrainous headaches in the past, those have improved.  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  She also developed muscle aches and pains and was suspected to have polymyalgia rheumatica but further laboratory tests have not confirmed that.  However she felt much better while on steroids and her pain discomfort and ache returned after the steroids were weaned off.  She will no longer be followed by rheumatology, gave her diclofenac . PA at the orthopedics office filled tizanidine.  She has severe hip pain, and is too heavy for the surgery.   Rhonda Simmons used her CPAP 29 out of 30 days with a compliance of 97% for time with an average of 8  hours and 6 minutes.  CPAP remains set at 7 cm water pressure with 3 cm EPR her 95th percentile pressure is currently at 12.6, her AHI is 4.0 it seems that all apneas are obstructive in nature.  She is using a nasal pillow interface.  I would like to increase the set pressure from 6-10 cmH2O.     03-26-2018;  Rhonda Simmons is a [age] y.o. female , seen here as in a referral  from her optometrist , Rhonda AusNiyati  Simmons , OD for  "terrible migraine headaches " and " I feel tired all the time". It was her eye doctor that saw her for sharp pains that seem to be located behind the eye, headaches, vision impairment this shadow and overlapping of visual field. All decreased acuity and dry eyes bilaterally. Medication list includes Cymbalta, Excedrin and Motrin.  I reviewed her ophthalmology records she has a corrected vision is 20/50 on the right, 20/30 on the right. She was suspected to develop astigmatism, hyperopia, presbyopia. Nuclear cataract at this time not mature enough for surgical procedure. Headaches which could be either related but the patient cannot have an MRI because of cochlear implants. And she has a small cyst at the temporal fovea.  Chief complaint according to patient : Rhonda Simmons reports that she has had headaches nearly every morning when she wakes up, associated with neck stiffness, nausea and at times vomiting.  She is status post anterior cervical fusion and does have a titanium rod implanted. She has had headaches for a long time but not to this intensity, not of the same quality and not of the same frequency. She often feels as if there is pressure applied on the eyeball and she actually saw ophthalmology, and needs to return for visual field examination. She states that beginning cataracts were suspected. She has TMJ and bruxism. She rarely is feeling well.  She has no energy to clean house, do anything. Takes Excedrin. She had undergone a spinal tap in high school and developed severe  headaches, was given compazine - had a seizure at age 67- attributed to the medication . Lost awareness for 2-3 minutes.  She was diagnosed years ago with sleep apnea, not in our lab, she's not using CPAP.  I have known Rhonda Simmons over 10 years as her father's and mother's neurologist and I have also seen her daughter for headaches. 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  Sleep habits are as follows: goes to bed with headaches, but often after having slept on and off all afternoon.  The patient reports that she goes anytime between 11 PM and 2 AM to bed - she does not have an established routine.   Sleep medical history and family sleep history: Her father had advancing Alzheimer's dementia before his death in 20 Rhonda Simmons) . Mother has had syncope, spells, dysphagia, failure to thrive , an TIA.   Social history:  Married, one daughter ( 29) . One son ( 24)  Rare ( one a week ) mountain dew , 2-3 iced tea a week. No tobacco use of any kind, seldomly alcohol -not  more than 2 or 3 times a year.   Interval History following her sleep studies, 03-26-2018. Rhonda Simmons reports good sleep on CPAP and her husband is happy she doesn't snore. She reports myalgias ,all over soreness, discomfort, weakness - she cannot lift arms above shoulder, standing up from a seated position causes pain in hamstrings. Climbing stairs is difficult. Proximal myalgia- myositis? She has seen ortho PA, who ordered blood work . She is supposed to have those today [facility].     DIAGNOSIS  - PSG from PSG on 06/11/17 which showed moderately-severe Obstructive Sleep Apnea (OSA) with overall AHI of 29.4/hr. No significant supine positional accentuation, and snoring was loudest in REM. The Wake baseline 02 saturation was 98%, with the lowest being 82%. Time spent below 89% saturation equaled 12 minutes.Obstructive Sleep Apnea,  responding well to only 7 cm water pressure, heated humidity and nasal pillow. The patient was fitted with a ResMed AirFit P10 (Small).Upper Airway Resistance Syndrome.   PLANS/RECOMMENDATIONS: CPAP was ordered. 7 cm water pressure, heated humidity and nasal pillow ResMed AirFit P10 (Small).DISCUSSION:  Rhonda Novas, M.D.   08-07-2017 .       Review of Systems: Out of a complete 14 system review, the patient complains of only the following symptoms, and all other reviewed systems are negative.  CPAP compliance was much better 10-16-2018- CPAP set at 7 cm, residual AHI 4/h OK resolution.  She has a cochlear implant and I cannot order MRI> she has not been treated with statins, she has no dermatomal pain,and no rash ( no dermatomyositis)   Headaches improved, now myalgia. Not longer snoring. OSA diasgnosed. Epworth sleepiness Score on CPAP 9 from 13/ 24, the  FSS at  48/ 63  points.     Social History   Socioeconomic History  . Marital status: Married    Spouse name: Not on file  . Number of children: 2  . Years of education: assoc  . Highest education level: Not on file  Occupational History  . Occupation: caretaker    Comment: for parents  Social Needs  . Financial resource strain: Not on file  . Food insecurity:    Worry: Not on file    Inability: Not on file  . Transportation needs:    Medical: Not on file    Non-medical: Not on file  Tobacco Use  . Smoking status: Never Smoker  . Smokeless tobacco: Never Used  Substance and Sexual Activity  . Alcohol use: Yes    Comment: occas  . Drug use: No  . Sexual activity: Not on file  Lifestyle  . Physical activity:    Days per week: Not on file    Minutes per session: Not on file  . Stress: Not on file  Relationships  . Social connections:    Talks on phone: Not on file    Gets together: Not on file    Attends religious service: Not on file    Active member of club or organization: Not on file    Attends meetings  of clubs or organizations: Not on file    Relationship status: Not on file  . Intimate partner violence:    Fear of current or ex partner: Not on file    Emotionally abused: Not on file    Physically abused: Not on file    Forced sexual activity: Not on file  Other Topics Concern  . Not on file  Social History Narrative  . Not on file    Family History  Problem Relation Age of Onset  . Alzheimer's disease Mother   . Alzheimer's disease Father     Past Medical History:  Diagnosis Date  . Chronic bronchitis    Dr. Maple Hudson  . Depression   . Hearing impaired   . 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 Hyperlipidemia   . Neuropathy     Past Surgical History:  Procedure Laterality Date  . INNER EAR SURGERY     cochlear implant - left  . NECK SURGERY      Current Outpatient Medications  Medication Sig Dispense Refill  . albuterol (PROAIR HFA) 108 (90 Base) MCG/ACT inhaler as needed.    . ALPRAZolam (XANAX) 0.25 MG tablet     . cephALEXin (KEFLEX) 500 MG capsule Take 1 capsule (500 mg total) by mouth 3 (three) times daily. 21 capsule 0  . diclofenac (VOLTAREN) 75 MG EC tablet     . doxycycline (VIBRA-TABS) 100 MG tablet Take 100 mg by mouth daily.  3  . DULoxetine (CYMBALTA) 60 MG capsule Take by mouth daily.  6  . EPINEPHrine (EPIPEN 2-PAK) 0.3 mg/0.3 mL IJ SOAJ injection Inject 0.3 mLs (0.3 mg total) into the muscle once as needed (for severe allergic reaction). CAll 911 immediately if you have to use this medicine 1 Device 0  . estradiol (ESTRACE VAGINAL) 0.1 MG/GM vaginal cream     . estradiol (VIVELLE-DOT) 0.1 MG/24HR patch APPLY 1 PATCH 2 TIMES A WEEK  1  . fluconazole (DIFLUCAN) 150 MG tablet Take 1 tablet (150 mg total) by mouth once. 1 tablet 1  . hydrOXYzine (ATARAX/VISTARIL) 25 MG tablet Take by mouth.    Marland Kitchen  ibuprofen (ADVIL,MOTRIN) 400 MG tablet Take 400 mg by mouth every 6 (six) hours as needed for pain.    . Lorcaserin HCl (BELVIQ) 10 MG TABS Take by mouth 2  (two) times daily.    . nitrofurantoin, macrocrystal-monohydrate, (MACROBID) 100 MG capsule TAKE 1 CAPSULE BY MOUTH TWICE A DAY UNTIL FINISHED  0  . oxyCODONE-acetaminophen (PERCOCET/ROXICET) 5-325 MG tablet     . phentermine (ADIPEX-P) 37.5 MG tablet Take 37.5 mg by mouth every morning.  3  . predniSONE (DELTASONE) 5 MG tablet     . tiZANidine (ZANAFLEX) 4 MG tablet     . topiramate (TOPAMAX) 50 MG tablet Take 1 tablet (50 mg total) by mouth 2 (two) times daily. 60 tablet 12  . zolpidem (AMBIEN) 10 MG tablet Take 10 mg by mouth at bedtime.  0   No current facility-administered medications for this visit.     Allergies as of 10/16/2018 - Review Complete 10/16/2018  Allergen Reaction Noted  . Compazine  10/26/2011  . Fexofenadine  10/27/2007  . Midol [aspirin-cinnamedrine-caffeine]  12/01/2012  . Naproxen sodium      Vitals: BP (!) 125/96   Pulse 91   Ht 5\' 2"  (1.575 m)   Wt 242 lb (109.8 kg)   BMI 44.26 kg/m  Last Weight:  Wt Readings from Last 1 Encounters:  10/16/18 242 lb (109.8 kg)   ZOX:WRUEBMI:Body mass index is 44.26 kg/m.     Last Height:   Ht Readings from Last 1 Encounters:  10/16/18 5\' 2"  (1.575 m)    Physical exam:  General: The patient is awake, alert and appears not in acute distress. The patient is well groomed. Head: Normocephalic, atraumatic. Neck is supple. Mallampati 4,  neck circumference:17. Nasal airflow -congestion noted  . Retrognathia is seen.  Cardiovascular:  Regular rate and rhythm, without  murmurs or carotid bruit, and without distended neck veins. Respiratory: Lungs are clear to auscultation. Skin:  Without evidence of edema, or rash Trunk: BMI is over 44- further increased over the last 18 month.  The patient's posture is erect  Neurologic exam :The patient is awake and alert, oriented to place and time.   Memory subjective described as intact. Attention span & concentration ability appears normal.  Speech is fluent, without dysarthria,  dysphonia or aphasia. Mood and affect are appropriate.  Cranial nerves: Pupils are equal and briskly reactive to light. Extraocular movements  in vertical and horizontal planes intact and without nystagmus. Visual fields by finger perimetry are intact. Hearing impaired but improved with cochlear implant.  Facial sensation intact to fine touch.  Facial motor strength is symmetric , her  tongue and uvula move midline. Shoulder shrug was symmetrical.   Motor exam:  Passive range of motion of the neck as well as active neck motion retroflexion rotation and lateral bending all lead to a audible crackles. Crepitus. Normal tone, muscle bulk and symmetric strength in all extremities.  Sensory:  Fine touch, pinprick and vibration were tested in all extremities. Proprioception tested in the upper extremities was normal.  Coordination: Rapid alternating movements in the fingers/hands was normal. Finger-to-nose maneuver  normal without evidence of ataxia, dysmetria or tremor.  Gait and station: Patient walks without assistive device and is able unassisted to climb up to the exam table. Strength within normal limits. Stance is stable and normal.  Romberg testing is  negative. Deep tendon reflexes: in the upper and lower extremities are symmetric and intact. Babinski maneuver response is downgoing.    Assessment:  After physical and neurologic examination, review of laboratory studies,  Personal review of imaging studies, reports of other /same  Imaging studies, results of polysomnography and / or neurophysiology testing and pre-existing records as far as provided in visit., my assessment is   1) OSA diagnosed and treated on CPAP - sleep improved . She is less insomnic and less fatigued. 7 hours of average sleep time.    2)Obesity -  Has increased weight on steroids- which have been helping her with pain.   she has seen abdominal surgeon Wenda Low, M.D, who recommended that she could undergo bariatric  surgery. Depending on the sleep apnea evaluation results this may still be something she should pursue.   I spent more than 35 minutes of face to face time with the patient.  Greater than 50% of time was spent in counseling and coordination of care. We have discussed the diagnosis and differential and I answered the patient's questions.    Plan:  Treatment plan and additional workup :  1)Continue CPAP, I will adjust the auto-set from 6-10 cm water pressure with EPR of 3 cm, she is highly compliant.  2) physical therapy in preparation for hip surgery, weight loss needed. She seems depressed, too.   She has no longer an interest in bariatric surgery.  She is interested in medical weight and wellness, and SAXENDA.  RV in 6 Month   Rhonda Novas, MD 10/16/2018, 4:06 PM  Certified in Neurology by [employer]BP[employer] Certified in Sleep Medicine by [facility] [address] [place], [place] [address]

## 2018-10-17 ENCOUNTER — Telehealth: Payer: Self-pay | Admitting: *Deleted

## 2018-10-17 LAB — TSH+FREE T4
Free T4: 1.13 ng/dL (ref 0.82–1.77)
TSH: 1.23 u[IU]/mL (ref 0.450–4.500)

## 2018-10-17 NOTE — Telephone Encounter (Signed)
Called pt & LVM (ok per DPR) informing pt that her thyroid hormone level is normal. Per Dr. Vickey Huger, there is no neurological explanation of her myalgias, fatigue, and pain. Left office number asking for call back if she has any questions. Reminded her of appt with Dr. Vickey Huger 04/16/2019 @ 1:30 pm and also let her know that Dr. Tiburcio Pea received these lab results.

## 2018-10-17 NOTE — Telephone Encounter (Signed)
-----   Message from Melvyn Novas, MD sent at 10/17/2018 10:09 AM EST ----- Normal thyroid hormone level.  No neurological explanation for patients myalgia, fatigue and pain.   DR. Tiburcio Pea, please involve phys med/ rheumatology/

## 2018-11-12 DIAGNOSIS — M25511 Pain in right shoulder: Secondary | ICD-10-CM | POA: Diagnosis not present

## 2018-11-12 DIAGNOSIS — M791 Myalgia, unspecified site: Secondary | ICD-10-CM | POA: Diagnosis not present

## 2018-11-12 DIAGNOSIS — M25512 Pain in left shoulder: Secondary | ICD-10-CM | POA: Diagnosis not present

## 2018-11-13 DIAGNOSIS — M67912 Unspecified disorder of synovium and tendon, left shoulder: Secondary | ICD-10-CM | POA: Diagnosis not present

## 2018-11-13 DIAGNOSIS — M545 Low back pain: Secondary | ICD-10-CM | POA: Diagnosis not present

## 2018-11-13 DIAGNOSIS — M1612 Unilateral primary osteoarthritis, left hip: Secondary | ICD-10-CM | POA: Diagnosis not present

## 2018-11-25 DIAGNOSIS — J0100 Acute maxillary sinusitis, unspecified: Secondary | ICD-10-CM | POA: Diagnosis not present

## 2018-12-01 ENCOUNTER — Other Ambulatory Visit: Payer: Self-pay | Admitting: Neurology

## 2018-12-10 DIAGNOSIS — E782 Mixed hyperlipidemia: Secondary | ICD-10-CM | POA: Diagnosis not present

## 2018-12-10 DIAGNOSIS — I10 Essential (primary) hypertension: Secondary | ICD-10-CM | POA: Diagnosis not present

## 2018-12-10 DIAGNOSIS — F324 Major depressive disorder, single episode, in partial remission: Secondary | ICD-10-CM | POA: Diagnosis not present

## 2018-12-10 DIAGNOSIS — F5101 Primary insomnia: Secondary | ICD-10-CM | POA: Diagnosis not present

## 2018-12-16 DIAGNOSIS — M1612 Unilateral primary osteoarthritis, left hip: Secondary | ICD-10-CM | POA: Diagnosis not present

## 2019-01-13 DIAGNOSIS — M1612 Unilateral primary osteoarthritis, left hip: Secondary | ICD-10-CM | POA: Diagnosis not present

## 2019-01-13 DIAGNOSIS — M7062 Trochanteric bursitis, left hip: Secondary | ICD-10-CM | POA: Diagnosis not present

## 2019-01-13 DIAGNOSIS — M25552 Pain in left hip: Secondary | ICD-10-CM | POA: Diagnosis not present

## 2019-02-03 DIAGNOSIS — B356 Tinea cruris: Secondary | ICD-10-CM | POA: Diagnosis not present

## 2019-02-12 DIAGNOSIS — G4733 Obstructive sleep apnea (adult) (pediatric): Secondary | ICD-10-CM | POA: Diagnosis not present

## 2019-02-17 DIAGNOSIS — I10 Essential (primary) hypertension: Secondary | ICD-10-CM | POA: Diagnosis not present

## 2019-02-17 DIAGNOSIS — R21 Rash and other nonspecific skin eruption: Secondary | ICD-10-CM | POA: Diagnosis not present

## 2019-03-09 DIAGNOSIS — Z01419 Encounter for gynecological examination (general) (routine) without abnormal findings: Secondary | ICD-10-CM | POA: Diagnosis not present

## 2019-03-09 DIAGNOSIS — Z1231 Encounter for screening mammogram for malignant neoplasm of breast: Secondary | ICD-10-CM | POA: Diagnosis not present

## 2019-03-09 DIAGNOSIS — Z6841 Body Mass Index (BMI) 40.0 and over, adult: Secondary | ICD-10-CM | POA: Diagnosis not present

## 2019-04-01 DIAGNOSIS — M1612 Unilateral primary osteoarthritis, left hip: Secondary | ICD-10-CM | POA: Diagnosis not present

## 2019-04-01 DIAGNOSIS — M25561 Pain in right knee: Secondary | ICD-10-CM | POA: Diagnosis not present

## 2019-04-01 DIAGNOSIS — M1711 Unilateral primary osteoarthritis, right knee: Secondary | ICD-10-CM | POA: Diagnosis not present

## 2019-04-01 DIAGNOSIS — M1712 Unilateral primary osteoarthritis, left knee: Secondary | ICD-10-CM | POA: Diagnosis not present

## 2019-04-01 DIAGNOSIS — M25562 Pain in left knee: Secondary | ICD-10-CM | POA: Diagnosis not present

## 2019-04-16 ENCOUNTER — Ambulatory Visit: Payer: Federal, State, Local not specified - PPO | Admitting: Neurology

## 2019-04-23 DIAGNOSIS — M25561 Pain in right knee: Secondary | ICD-10-CM | POA: Diagnosis not present

## 2019-04-23 DIAGNOSIS — M25552 Pain in left hip: Secondary | ICD-10-CM | POA: Diagnosis not present

## 2019-04-23 DIAGNOSIS — M25562 Pain in left knee: Secondary | ICD-10-CM | POA: Diagnosis not present

## 2019-04-23 DIAGNOSIS — M1612 Unilateral primary osteoarthritis, left hip: Secondary | ICD-10-CM | POA: Diagnosis not present

## 2019-05-14 DIAGNOSIS — M1612 Unilateral primary osteoarthritis, left hip: Secondary | ICD-10-CM | POA: Diagnosis not present

## 2019-05-21 ENCOUNTER — Telehealth: Payer: Self-pay | Admitting: Neurology

## 2019-05-21 NOTE — Telephone Encounter (Signed)
Called the patient and there was no answer. LVM informing the patient that the office will be closed on 07/07/2019. Informed the patient to call back and reschedule her apt with either NP or MD depending on what the schedule looks like. If patient is willing please offer her an apt with NP

## 2019-05-25 ENCOUNTER — Encounter: Payer: Self-pay | Admitting: Neurology

## 2019-05-27 DIAGNOSIS — H903 Sensorineural hearing loss, bilateral: Secondary | ICD-10-CM | POA: Diagnosis not present

## 2019-05-28 DIAGNOSIS — M25552 Pain in left hip: Secondary | ICD-10-CM | POA: Diagnosis not present

## 2019-05-28 DIAGNOSIS — M1612 Unilateral primary osteoarthritis, left hip: Secondary | ICD-10-CM | POA: Diagnosis not present

## 2019-06-02 ENCOUNTER — Other Ambulatory Visit: Payer: Self-pay | Admitting: Orthopedic Surgery

## 2019-06-03 NOTE — Patient Instructions (Signed)
DUE TO COVID-19 ONLY ONE VISITOR IS ALLOWED TO COME WITH YOU AND STAY IN THE WAITING ROOM ONLY DURING PRE OP AND PROCEDURE DAY OF SURGERY. THE 1 VISITOR MAY VISIT WITH YOU AFTER SURGERY IN YOUR PRIVATE ROOM DURING VISITING HOURS ONLY!  YOU NEED TO HAVE A COVID 19 TEST ON__Tuesday 10/06/2020_____ @_______ , THIS TEST MUST BE DONE BEFORE SURGERY, COME  [address], [place] , .  [facility]) ONCE YOUR COVID TEST IS COMPLETED, PLEASE BEGIN THE QUARANTINE INSTRUCTIONS AS OUTLINED IN YOUR HANDOUT.                Rhonda Simmons    Your procedure is scheduled on: Friday 06/12/2019   Report to [facility] Main  Entrance              Report to admitting at  0740 AM   How to Manage Your Diabetes Before and After Surgery  Why is it important to control my blood sugar before and after surgery? . Improving blood sugar levels before and after surgery helps healing and can limit problems. . A way of improving blood sugar control is eating a healthy diet by: o  Eating less sugar and carbohydrates o  Increasing activity/exercise o  Talking with your doctor about reaching your blood sugar goals . High blood sugars (greater than 180 mg/dL) can raise your risk of infections and slow your recovery, so you will need to focus on controlling your diabetes during the weeks before surgery. . Make sure that the doctor who takes care of your diabetes knows about your planned surgery including the date and location.  How do I manage my blood sugar before surgery? . Check your blood sugar at least 4 times a day, starting 2 days before surgery, to make sure that the level is not too high or low. o Check your blood sugar the morning of your surgery when you wake up and every 2 hours until you get to the Short Stay unit. . If your blood sugar is less than 70 mg/dL, you will need to treat for low blood sugar: o Do not take insulin. o Treat a low blood sugar (less than 70 mg/dL)  with  cup of clear juice (cranberry or apple), 4 glucose tablets, OR glucose gel. o Recheck blood sugar in 15 minutes after treatment (to make sure it is greater than 70 mg/dL). If your blood sugar is not greater than 70 mg/dL on recheck, call [facility] for further instructions. . Report your blood sugar to the short stay nurse when you get to Short Stay.  . If you are admitted to the hospital after surgery: o Your blood sugar will be checked by the staff and you will probably be given insulin after surgery (instead of oral diabetes medicines) to make sure you have good blood sugar levels. o The goal for blood sugar control after surgery is 80-180 mg/dL.   WHAT DO I DO ABOUT MY DIABETES MEDICATION?        The day before , Take Liraglutide (Victoza) as usual.  . Do not take oral diabetes medicines (pills) the morning of surgery.  . The day of surgery, do not take other diabetes injectables, including Byetta (exenatide), Bydureon (exenatide ER), Victoza (liraglutide), or Trulicity (dulaglutide).       Call this number if you have problems the morning of surgery [phone]    Remember: Do not eat food or drink liquids :After Midnight.  BRUSH YOUR TEETH MORNING OF SURGERY AND RINSE YOUR MOUTH OUT, NO CHEWING GUM CANDY OR MINTS.     Take these medicines the morning of surgery with A SIP OF WATER: Topiramate(Topamax), Duloxetine (Cymbalta)              DO NOT TAKE ANY DIABETIC MEDICATIONS DAY OF YOUR SURGERY!                               You may not have any metal on your body including hair pins and              piercings  Do not wear jewelry, make-up, lotions, powders or perfumes, deodorant             Do not wear nail polish on your fingernails.  Do not shave  48 hours prior to surgery.               Do not bring valuables to the hospital. [place].  Contacts, dentures or bridgework may not be worn into  surgery.  Leave suitcase in the car. After surgery it may be brought to your room.                  Please read over the following fact sheets you were given: _____________________________________________________________________             [facility] - Preparing for Surgery Before surgery, you can play an important role.  Because skin is not sterile, your skin needs to be as free of germs as possible.  You can reduce the number of germs on your skin by washing with CHG (chlorahexidine gluconate) soap before surgery.  CHG is an antiseptic cleaner which kills germs and bonds with the skin to continue killing germs even after washing. Please DO NOT use if you have an allergy to CHG or antibacterial soaps.  If your skin becomes reddened/irritated stop using the CHG and inform your nurse when you arrive at Short Stay. Do not shave (including legs and underarms) for at least 48 hours prior to the first CHG shower.  You may shave your face/neck. Please follow these instructions carefully:  1.  Shower with CHG Soap the night before surgery and the  morning of Surgery.  2.  If you choose to wash your hair, wash your hair first as usual with your  normal  shampoo.  3.  After you shampoo, rinse your hair and body thoroughly to remove the  shampoo.                           4.  Use CHG as you would any other liquid soap.  You can apply chg directly  to the skin and wash                       Gently with a scrungie or clean washcloth.  5.  Apply the CHG Soap to your body ONLY FROM THE NECK DOWN.   Do not use on face/ open                           Wound or open sores. Avoid contact with eyes, ears mouth and genitals (private parts).  Wash face,  Genitals (private parts) with your normal soap.             6.  Wash thoroughly, paying special attention to the area where your surgery  will be performed.  7.  Thoroughly rinse your body with warm water from the neck down.  8.  DO NOT  shower/wash with your normal soap after using and rinsing off  the CHG Soap.                9.  Pat yourself dry with a clean towel.            10.  Wear clean pajamas.            11.  Place clean sheets on your bed the night of your first shower and do not  sleep with pets. Day of Surgery : Do not apply any lotions/deodorants the morning of surgery.  Please wear clean clothes to the hospital/surgery center.  FAILURE TO FOLLOW THESE INSTRUCTIONS MAY RESULT IN THE CANCELLATION OF YOUR SURGERY PATIENT SIGNATURE_________________________________  NURSE SIGNATURE__________________________________  ________________________________________________________________________   Rhonda Simmons  An incentive spirometer is a tool that can help keep your lungs clear and active. This tool measures how well you are filling your lungs with each breath. Taking long deep breaths may help reverse or decrease the chance of developing breathing (pulmonary) problems (especially infection) following:  A long period of time when you are unable to move or be active. BEFORE THE PROCEDURE   If the spirometer includes an indicator to show your best effort, your nurse or respiratory therapist will set it to a desired goal.  If possible, sit up straight or lean slightly forward. Try not to slouch.  Hold the incentive spirometer in an upright position. INSTRUCTIONS FOR USE  1. Sit on the edge of your bed if possible, or sit up as far as you can in bed or on a chair. 2. Hold the incentive spirometer in an upright position. 3. Breathe out normally. 4. Place the mouthpiece in your mouth and seal your lips tightly around it. 5. Breathe in slowly and as deeply as possible, raising the piston or the ball toward the top of the column. 6. Hold your breath for 3-5 seconds or for as long as possible. Allow the piston or ball to fall to the bottom of the column. 7. Remove the mouthpiece from your mouth and breathe out  normally. 8. Rest for a few seconds and repeat Steps 1 through 7 at least 10 times every 1-2 hours when you are awake. Take your time and take a few normal breaths between deep breaths. 9. The spirometer may include an indicator to show your best effort. Use the indicator as a goal to work toward during each repetition. 10. After each set of 10 deep breaths, practice coughing to be sure your lungs are clear. If you have an incision (the cut made at the time of surgery), support your incision when coughing by placing a pillow or rolled up towels firmly against it. Once you are able to get out of bed, walk around indoors and cough well. You may stop using the incentive spirometer when instructed by your caregiver.  RISKS AND COMPLICATIONS  Take your time so you do not get dizzy or light-headed.  If you are in pain, you may need to take or ask for pain medication before doing incentive spirometry. It is harder to take a deep breath if you are having  pain. AFTER USE  Rest and breathe slowly and easily.  It can be helpful to keep track of a log of your progress. Your caregiver can provide you with a simple table to help with this. If you are using the spirometer at home, follow these instructions: [place] IF:   You are having difficultly using the spirometer.  You have trouble using the spirometer as often as instructed.  Your pain medication is not giving enough relief while using the spirometer.  You develop fever of 100.5 F (38.1 C) or higher. SEEK IMMEDIATE MEDICAL CARE IF:   You cough up bloody sputum that had not been present before.  You develop fever of 102 F (38.9 C) or greater.  You develop worsening pain at or near the incision site. MAKE SURE YOU:   Understand these instructions.  Will watch your condition.  Will get help right away if you are not doing well or get worse. Document Released: 12/31/2006 Document Revised: 11/12/2011 Document Reviewed:  03/03/2007 [employer] Patient Information 2014 [employer], [place].   ________________________________________________________________________  WHAT IS A BLOOD TRANSFUSION? Blood Transfusion Information  A transfusion is the replacement of blood or some of its parts. Blood is made up of multiple cells which provide different functions.  Red blood cells carry oxygen and are used for blood loss replacement.  White blood cells fight against infection.  Platelets control bleeding.  Plasma helps clot blood.  Other blood products are available for specialized needs, such as hemophilia or other clotting disorders. BEFORE THE TRANSFUSION  Who gives blood for transfusions?   Healthy volunteers who are fully evaluated to make sure their blood is safe. This is blood bank blood. Transfusion therapy is the safest it has ever been in the practice of medicine. Before blood is taken from a donor, a complete history is taken to make sure that person has no history of diseases nor engages in risky social behavior (examples are intravenous drug use or sexual activity with multiple partners). The donor's travel history is screened to minimize risk of transmitting infections, such as malaria. The donated blood is tested for signs of infectious diseases, such as HIV and hepatitis. The blood is then tested to be sure it is compatible with you in order to minimize the chance of a transfusion reaction. If you or a relative donates blood, this is often done in anticipation of surgery and is not appropriate for emergency situations. It takes many days to process the donated blood. RISKS AND COMPLICATIONS Although transfusion therapy is very safe and saves many lives, the main dangers of transfusion include:   Getting an infectious disease.  Developing a transfusion reaction. This is an allergic reaction to something in the blood you were given. Every precaution is taken to prevent this. The decision to have a blood  transfusion has been considered carefully by your caregiver before blood is given. Blood is not given unless the benefits outweigh the risks. AFTER THE TRANSFUSION  Right after receiving a blood transfusion, you will usually feel much better and more energetic. This is especially true if your red blood cells have gotten low (anemic). The transfusion raises the level of the red blood cells which carry oxygen, and this usually causes an energy increase.  The nurse administering the transfusion will monitor you carefully for complications. HOME CARE INSTRUCTIONS  No special instructions are needed after a transfusion. You may find your energy is better. Speak with your caregiver about any limitations on activity for underlying  diseases you may have. SEEK MEDICAL CARE IF:   Your condition is not improving after your transfusion.  You develop redness or irritation at the intravenous (IV) site. SEEK IMMEDIATE MEDICAL CARE IF:  Any of the following symptoms occur over the next 12 hours:  Shaking chills.  You have a temperature by mouth above 102 F (38.9 C), not controlled by medicine.  Chest, back, or muscle pain.  People around you feel you are not acting correctly or are confused.  Shortness of breath or difficulty breathing.  Dizziness and fainting.  You get a rash or develop hives.  You have a decrease in urine output.  Your urine turns a dark color or changes to pink, red, or brown. Any of the following symptoms occur over the next 10 days:  You have a temperature by mouth above 102 F (38.9 C), not controlled by medicine.  Shortness of breath.  Weakness after normal activity.  The white part of the eye turns yellow (jaundice).  You have a decrease in the amount of urine or are urinating less often.  Your urine turns a dark color or changes to pink, red, or brown. Document Released: 08/17/2000 Document Revised: 11/12/2011 Document Reviewed: 04/05/2008 [facility] Patient  Information 2014 [place], [place].  _______________________________________________________________________

## 2019-06-04 ENCOUNTER — Encounter (HOSPITAL_COMMUNITY)
Admission: RE | Admit: 2019-06-04 | Discharge: 2019-06-04 | Disposition: A | Discharge status: Home / Self Care | Payer: Federal, State, Local not specified - PPO | Source: Ambulatory Visit

## 2019-06-04 NOTE — Patient Instructions (Addendum)
DUE TO COVID-19 ONLY ONE VISITOR IS ALLOWED TO COME WITH YOU AND STAY IN THE WAITING ROOM ONLY DURING PRE OP AND PROCEDURE. THE ONE VISITOR MAY VISIT WITH YOU IN YOUR PRIVATE ROOM DURING VISITING HOURS ONLY!!   COVID SWAB TESTING MUST BE COMPLETED ON: Tuesday, Oct. 6, 2020 at  2:50PM.  [address] [place] [facility] enter pre surgical testing line (Must self quarantine after testing. Follow instructions on handout.)             Your procedure is scheduled on: Friday, Oct. 9, 2020   Report to [facility] Main  Entrance    Report to admitting at 7:40 AM   Call this number if you have problems the morning of surgery [phone]   Bring CPAP mask and tubing day of surgery   Do not eat food:After Midnight.   May have liquids until 7:00 AM day of surgery   CLEAR LIQUID DIET  Foods Allowed                                                                     Foods Excluded  Water, Black Coffee and tea, regular and decaf                             liquids that you cannot  Plain Jell-O in any flavor  (No red)                                           see through such as: Fruit ices (not with fruit pulp)                                     milk, soups, orange juice  Iced Popsicles (No red)                                    All solid food Carbonated beverages, regular and diet                                    Apple juices Sports drinks like Gatorade (No red) Lightly seasoned clear broth or consume(fat free) Sugar, honey syrup  Sample Menu Breakfast                                Lunch                                     Supper Cranberry juice                    Beef broth  Chicken broth Jell-O                                     Grape juice                           Apple juice Coffee or tea                        Jell-O                                      Popsicle                                                Coffee or  tea                        Coffee or tea   Complete one Ensure drink the morning of surgery at 7:00AM the day of surgery.   Brush your teeth the morning of surgery.   Do NOT smoke after Midnight   Take these medicines the morning of surgery with A SIP OF WATER: Duloxetine, Alprazolam if needed  DO NOT TAKE ANY DIABETIC MEDICATIONS DAY OF YOUR SURGERY                               You may not have any metal on your body including hair pins, jewelry, and body piercings             Do not wear make-up, lotions, powders, perfumes/cologne, or deodorant             Do not wear nail polish.  Do not shave  48 hours prior to surgery.            Do not bring valuables to the hospital. [facility] IS NOT             RESPONSIBLE   FOR VALUABLES.   Contacts, dentures or bridgework may not be worn into surgery.   Bring small overnight bag day of surgery.    Special Instructions: Bring a copy of your healthcare power of attorney and living will documents         the day of surgery if you haven't scanned them in before.              Please read over the following fact sheets you were given:  How to Manage Your Diabetes Before and After Surgery  Why is it important to control my blood sugar before and after surgery? . Improving blood sugar levels before and after surgery helps healing and can limit problems. . A way of improving blood sugar control is eating a healthy diet by: o  Eating less sugar and carbohydrates o  Increasing activity/exercise o  Talking with your doctor about reaching your blood sugar goals . High blood sugars (greater than 180 mg/dL) can raise your risk of infections and slow your recovery, so you will need to focus on controlling your diabetes during the weeks before surgery. . Make sure that the  doctor who takes care of your diabetes knows about your planned surgery including the date and location.  How do I manage my blood sugar before surgery? . Check your blood sugar  at least 4 times a day, starting 2 days before surgery, to make sure that the level is not too high or low. o Check your blood sugar the morning of your surgery when you wake up and every 2 hours until you get to the Short Stay unit. . If your blood sugar is less than 70 mg/dL, you will need to treat for low blood sugar: o Do not take insulin. o Treat a low blood sugar (less than 70 mg/dL) with  cup of clear juice (cranberry or apple), 4 glucose tablets, OR glucose gel. o Recheck blood sugar in 15 minutes after treatment (to make sure it is greater than 70 mg/dL). If your blood sugar is not greater than 70 mg/dL on recheck, call [phone] for further instructions. . Report your blood sugar to the short stay nurse when you get to Short Stay.  . If you are admitted to the hospital after surgery: o Your blood sugar will be checked by the staff and you will probably be given insulin after surgery (instead of oral diabetes medicines) to make sure you have good blood sugar levels. o The goal for blood sugar control after surgery is 80-180 mg/dL.   WHAT DO I DO ABOUT MY DIABETES MEDICATION?  Marland Kitchen Do not take oral diabetes medicines (pills) the morning of surgery.  . The day of surgery, do not take other diabetes injectables, including Byetta (exenatide), Bydureon (exenatide ER), Victoza (liraglutide), or Trulicity (dulaglutide).  . If your CBG is greater than 220 mg/dL, you may take  of your sliding scale  . (correction) dose of insulin.    For patients with insulin pumps: Contact your diabetes doctor for specific instructions before surgery. Decrease basal rates by 20% at midnight the night before your surgery. Note that if your surgery is planned to be longer than 2 hours, your insulin pump will be removed and intravenous (IV) insulin will be started and managed by the nurses and the anesthesiologist. You will be able to restart your insulin pump once you are awake and able to manage it.  Make  sure to bring insulin pump supplies to the hospital with you in case the  site needs to be changed.  Patient Signature:  Date:   Nurse Signature:  Date:   Reviewed and Endorsed by [facility] Patient Education Committee, August 2015 [facility] - Preparing for Surgery Before surgery, you can play an important role.  Because skin is not sterile, your skin needs to be as free of germs as possible.  You can reduce the number of germs on your skin by washing with CHG (chlorahexidine gluconate) soap before surgery.  CHG is an antiseptic cleaner which kills germs and bonds with the skin to continue killing germs even after washing. Please DO NOT use if you have an allergy to CHG or antibacterial soaps.  If your skin becomes reddened/irritated stop using the CHG and inform your nurse when you arrive at Short Stay. Do not shave (including legs and underarms) for at least 48 hours prior to the first CHG shower.  You may shave your face/neck.  Please follow these instructions carefully:  1.  Shower with CHG Soap the night before surgery and the  morning of surgery.  2.  If you choose to wash your hair, wash  your hair first as usual with your normal  shampoo.  3.  After you shampoo, rinse your hair and body thoroughly to remove the shampoo.                             4.  Use CHG as you would any other liquid soap.  You can apply chg directly to the skin and wash.  Gently with a scrungie or clean washcloth.  5.  Apply the CHG Soap to your body ONLY FROM THE NECK DOWN.   Do   not use on face/ open                           Wound or open sores. Avoid contact with eyes, ears mouth and   genitals (private parts).                       Wash face,  Genitals (private parts) with your normal soap.             6.  Wash thoroughly, paying special attention to the area where your    surgery  will be performed.  7.  Thoroughly rinse your body with warm water from the neck down.  8.  DO NOT shower/wash with your  normal soap after using and rinsing off the CHG Soap.                9.  Pat yourself dry with a clean towel.            10.  Wear clean pajamas.            11.  Place clean sheets on your bed the night of your first shower and do not  sleep with pets. Day of Surgery : Do not apply any lotions/deodorants the morning of surgery.  Please wear clean clothes to the hospital/surgery center.  FAILURE TO FOLLOW THESE INSTRUCTIONS MAY RESULT IN THE CANCELLATION OF YOUR SURGERY  PATIENT SIGNATURE_________________________________  NURSE SIGNATURE__________________________________  ________________________________________________________________________   Rogelia MireIncentive Spirometer  An incentive spirometer is a tool that can help keep your lungs clear and active. This tool measures how well you are filling your lungs with each breath. Taking long deep breaths may help reverse or decrease the chance of developing breathing (pulmonary) problems (especially infection) following:  A long period of time when you are unable to move or be active. BEFORE THE PROCEDURE   If the spirometer includes an indicator to show your best effort, your nurse or respiratory therapist will set it to a desired goal.  If possible, sit up straight or lean slightly forward. Try not to slouch.  Hold the incentive spirometer in an upright position. INSTRUCTIONS FOR USE  1. Sit on the edge of your bed if possible, or sit up as far as you can in bed or on a chair. 2. Hold the incentive spirometer in an upright position. 3. Breathe out normally. 4. Place the mouthpiece in your mouth and seal your lips tightly around it. 5. Breathe in slowly and as deeply as possible, raising the piston or the ball toward the top of the column. 6. Hold your breath for 3-5 seconds or for as long as possible. Allow the piston or ball to fall to the bottom of the column. 7. Remove the mouthpiece from your mouth and breathe out normally. 8. Rest for  a  few seconds and repeat Steps 1 through 7 at least 10 times every 1-2 hours when you are awake. Take your time and take a few normal breaths between deep breaths. 9. The spirometer may include an indicator to show your best effort. Use the indicator as a goal to work toward during each repetition. 10. After each set of 10 deep breaths, practice coughing to be sure your lungs are clear. If you have an incision (the cut made at the time of surgery), support your incision when coughing by placing a pillow or rolled up towels firmly against it. Once you are able to get out of bed, walk around indoors and cough well. You may stop using the incentive spirometer when instructed by your caregiver.  RISKS AND COMPLICATIONS  Take your time so you do not get dizzy or light-headed.  If you are in pain, you may need to take or ask for pain medication before doing incentive spirometry. It is harder to take a deep breath if you are having pain. AFTER USE  Rest and breathe slowly and easily.  It can be helpful to keep track of a log of your progress. Your caregiver can provide you with a simple table to help with this. If you are using the spirometer at home, follow these instructions: SEEK MEDICAL CARE IF:   You are having difficultly using the spirometer.  You have trouble using the spirometer as often as instructed.  Your pain medication is not giving enough relief while using the spirometer.  You develop fever of 100.5 F (38.1 C) or higher. SEEK IMMEDIATE MEDICAL CARE IF:   You cough up bloody sputum that had not been present before.  You develop fever of 102 F (38.9 C) or greater.  You develop worsening pain at or near the incision site. MAKE SURE YOU:   Understand these instructions.  Will watch your condition.  Will get help right away if you are not doing well or get worse. Document Released: 12/31/2006 Document Revised: 11/12/2011 Document Reviewed: 03/03/2007 ExitCare Patient  Information 2014 [employer].   ________________________________________________________________________    RE: MyChart  Dear Ms. Kibe  MyChart makes it easy for you to view your health information - all in one secure location - from any computer or mobile device at any time. Use the activation code below to enroll in MyChart online at [email or web address]   Once your account is activated, you can:  Marland Kitchen View your test results. . Communicate securely with your physician's office.  . View your medical history, allergies, medications, and immunizations. . Receive care virtually through an e-Visit.   If you are over 18, you may use features of MyChart to manage the health information of your spouse, children or others you care for.  Download child and adult access forms at [email or web address].    As you activate your MyChart account and need any technical assistance, please call the MyChart technical support line at [phone]).  Be sure to also download the MyChart app for your mobile device.   Thank you for choosing [facility] for your family's health care needs!   MyChart Activation Code:  Activation code not generated Current MyChart Status: Active             [facility]  [address] [place], [place] [address]

## 2019-06-05 ENCOUNTER — Ambulatory Visit (HOSPITAL_COMMUNITY)
Admission: RE | Admit: 2019-06-05 | Discharge: 2019-06-05 | Disposition: A | Discharge status: Home / Self Care | Payer: Federal, State, Local not specified - PPO | Source: Ambulatory Visit | Attending: Orthopedic Surgery | Admitting: Orthopedic Surgery

## 2019-06-05 ENCOUNTER — Encounter (HOSPITAL_COMMUNITY)
Admission: RE | Admit: 2019-06-05 | Discharge: 2019-06-05 | Disposition: A | Discharge status: Home / Self Care | Payer: Federal, State, Local not specified - PPO | Source: Ambulatory Visit | Attending: Orthopedic Surgery | Admitting: Orthopedic Surgery

## 2019-06-05 ENCOUNTER — Other Ambulatory Visit: Payer: Self-pay

## 2019-06-05 ENCOUNTER — Encounter (HOSPITAL_COMMUNITY): Payer: Self-pay

## 2019-06-05 DIAGNOSIS — M1612 Unilateral primary osteoarthritis, left hip: Secondary | ICD-10-CM | POA: Diagnosis not present

## 2019-06-05 DIAGNOSIS — Z01811 Encounter for preprocedural respiratory examination: Secondary | ICD-10-CM | POA: Insufficient documentation

## 2019-06-05 DIAGNOSIS — I517 Cardiomegaly: Secondary | ICD-10-CM | POA: Diagnosis not present

## 2019-06-05 HISTORY — DX: Unspecified osteoarthritis, unspecified site: M19.90

## 2019-06-05 HISTORY — DX: Migraine, unspecified, not intractable, without status migrainosus: G43.909

## 2019-06-05 HISTORY — DX: Obesity, unspecified: E66.9

## 2019-06-05 HISTORY — DX: Fatty (change of) liver, not elsewhere classified: K76.0

## 2019-06-05 HISTORY — DX: Umbilical hernia without obstruction or gangrene: K42.9

## 2019-06-05 HISTORY — DX: Obstructive sleep apnea (adult) (pediatric): G47.33

## 2019-06-05 HISTORY — DX: Personal history of other diseases of the nervous system and sense organs: Z86.69

## 2019-06-05 HISTORY — DX: Other complications of anesthesia, initial encounter: T88.59XA

## 2019-06-05 HISTORY — DX: Plantar fascial fibromatosis: M72.2

## 2019-06-05 HISTORY — DX: Unspecified asthma, uncomplicated: J45.909

## 2019-06-05 HISTORY — DX: Diverticulosis of intestine, part unspecified, without perforation or abscess without bleeding: K57.90

## 2019-06-05 LAB — ABO/RH: ABO/RH(D): A NEG

## 2019-06-05 LAB — CBC WITH DIFFERENTIAL/PLATELET
Abs Immature Granulocytes: 0.1 10*3/uL — ABNORMAL HIGH (ref 0.00–0.07)
Basophils Absolute: 0.1 10*3/uL (ref 0.0–0.1)
Basophils Relative: 1 %
Eosinophils Absolute: 0.3 10*3/uL (ref 0.0–0.5)
Eosinophils Relative: 4 %
HCT: 43.8 % (ref 36.0–46.0)
Hemoglobin: 13.9 g/dL (ref 12.0–15.0)
Immature Granulocytes: 1 %
Lymphocytes Relative: 30 %
Lymphs Abs: 2.7 10*3/uL (ref 0.7–4.0)
MCH: 30 pg (ref 26.0–34.0)
MCHC: 31.7 g/dL (ref 30.0–36.0)
MCV: 94.4 fL (ref 80.0–100.0)
Monocytes Absolute: 0.9 10*3/uL (ref 0.1–1.0)
Monocytes Relative: 10 %
Neutro Abs: 4.9 10*3/uL (ref 1.7–7.7)
Neutrophils Relative %: 54 %
Platelets: 308 10*3/uL (ref 150–400)
RBC: 4.64 MIL/uL (ref 3.87–5.11)
RDW: 13.2 % (ref 11.5–15.5)
WBC: 8.9 10*3/uL (ref 4.0–10.5)
nRBC: 0 % (ref 0.0–0.2)

## 2019-06-05 LAB — COMPREHENSIVE METABOLIC PANEL
ALT: 34 U/L (ref 0–44)
AST: 22 U/L (ref 15–41)
Albumin: 4.5 g/dL (ref 3.5–5.0)
Alkaline Phosphatase: 57 U/L (ref 38–126)
Anion gap: 10 (ref 5–15)
BUN: 20 mg/dL (ref 6–20)
CO2: 25 mmol/L (ref 22–32)
Calcium: 9.1 mg/dL (ref 8.9–10.3)
Chloride: 103 mmol/L (ref 98–111)
Creatinine, Ser: 0.71 mg/dL (ref 0.44–1.00)
GFR calc Af Amer: >60 mL/min (ref >60)
GFR calc non Af Amer: >60 mL/min (ref >60)
Glucose, Bld: 88 mg/dL (ref 70–99)
Potassium: 4 mmol/L (ref 3.5–5.1)
Sodium: 138 mmol/L (ref 135–145)
Total Bilirubin: 0.5 mg/dL (ref 0.3–1.2)
Total Protein: 7.2 g/dL (ref 6.5–8.1)

## 2019-06-05 LAB — SURGICAL PCR SCREEN
MRSA, PCR: NEGATIVE
Staphylococcus aureus: POSITIVE — AB

## 2019-06-05 LAB — HEMOGLOBIN A1C
Hgb A1c MFr Bld: 5.6 % (ref 4.8–5.6)
Mean Plasma Glucose: 114.02 mg/dL

## 2019-06-05 LAB — APTT: aPTT: 32 seconds (ref 24–36)

## 2019-06-05 LAB — PROTIME-INR
INR: 0.8 (ref 0.8–1.2)
Prothrombin Time: 11 seconds — ABNORMAL LOW (ref 11.4–15.2)

## 2019-06-05 LAB — GLUCOSE, CAPILLARY: Glucose-Capillary: 112 mg/dL — ABNORMAL HIGH (ref 70–99)

## 2019-06-05 NOTE — Progress Notes (Signed)
SPOKE W/ Sagal     SCREENING SYMPTOMS OF COVID 19:   COUGH--NO  RUNNY NOSE--- NO  SORE THROAT---NO  NASAL CONGESTION----NO  SNEEZING----NO  SHORTNESS OF BREATH---NO  DIFFICULTY BREATHING---NO  TEMP >100.0 -----NO  UNEXPLAINED BODY ACHES------NO  CHILLS -------- NO  HEADACHES ---------NO  LOSS OF SMELL/ TASTE --------NO    HAVE YOU OR ANY FAMILY MEMBER TRAVELLED PAST 14 DAYS OUT OF THE   COUNTY---NO STATE----NO COUNTRY----NO  HAVE YOU OR ANY FAMILY MEMBER BEEN EXPOSED TO ANYONE WITH COVID 19? NO

## 2019-06-05 NOTE — Progress Notes (Addendum)
PCP - Dr. Shirline Frees Cardiologist - Dr. Wynonia Lawman  Chest x-ray - 06/05/2019 in epic EKG - 06/05/2019 in epic Stress Test - not in last 2 years  ECHO - N/A Cardiac Cath - N/A  Sleep Study - 08/2017 CPAP - Yes  Fasting Blood Sugar - 97-107 denies DM take Victoza for weight loss Checks Blood Sugar _random not daily____  Blood Thinner Instructions:N/A Aspirin Instructions:N/A Last Dose:N/A  Anesthesia review: OSA, awaiting final EKG   Patient denies shortness of breath, fever, cough and chest pain at PAT appointment   Patient verbalized understanding of instructions that were given to them at the PAT appointment. Patient was also instructed that they will need to review over the PAT instructions again at home before surgery.

## 2019-06-09 ENCOUNTER — Other Ambulatory Visit (HOSPITAL_COMMUNITY)
Admission: RE | Admit: 2019-06-09 | Discharge: 2019-06-09 | Disposition: A | Discharge status: Home / Self Care | Payer: Federal, State, Local not specified - PPO | Source: Ambulatory Visit | Attending: Orthopedic Surgery | Admitting: Orthopedic Surgery

## 2019-06-09 DIAGNOSIS — F5101 Primary insomnia: Secondary | ICD-10-CM | POA: Diagnosis not present

## 2019-06-09 DIAGNOSIS — I10 Essential (primary) hypertension: Secondary | ICD-10-CM | POA: Diagnosis not present

## 2019-06-09 DIAGNOSIS — Z20828 Contact with and (suspected) exposure to other viral communicable diseases: Secondary | ICD-10-CM | POA: Insufficient documentation

## 2019-06-09 DIAGNOSIS — N3000 Acute cystitis without hematuria: Secondary | ICD-10-CM | POA: Diagnosis not present

## 2019-06-09 DIAGNOSIS — Z01812 Encounter for preprocedural laboratory examination: Secondary | ICD-10-CM | POA: Diagnosis not present

## 2019-06-09 DIAGNOSIS — M1612 Unilateral primary osteoarthritis, left hip: Secondary | ICD-10-CM | POA: Diagnosis not present

## 2019-06-10 LAB — NOVEL CORONAVIRUS, NAA (HOSP ORDER, SEND-OUT TO REF LAB; TAT 18-24 HRS): SARS-CoV-2, NAA: NOT DETECTED

## 2019-06-11 MED ORDER — BUPIVACAINE LIPOSOME 1.3 % IJ SUSP
20.0000 mL | INTRAMUSCULAR | Status: DC
  Filled 2019-06-11: qty 20

## 2019-06-12 ENCOUNTER — Ambulatory Visit (HOSPITAL_COMMUNITY): Payer: Federal, State, Local not specified - PPO

## 2019-06-12 ENCOUNTER — Inpatient Hospital Stay (HOSPITAL_COMMUNITY)
Admission: RE | Admit: 2019-06-12 | Discharge: 2019-06-15 | DRG: 470 | Disposition: A | Discharge status: Home / Self Care | Payer: Federal, State, Local not specified - PPO | Attending: Orthopedic Surgery | Admitting: Orthopedic Surgery

## 2019-06-12 ENCOUNTER — Ambulatory Visit (HOSPITAL_COMMUNITY): Payer: Federal, State, Local not specified - PPO | Admitting: Certified Registered Nurse Anesthetist

## 2019-06-12 ENCOUNTER — Ambulatory Visit (HOSPITAL_COMMUNITY): Payer: Federal, State, Local not specified - PPO | Admitting: Physician Assistant

## 2019-06-12 ENCOUNTER — Other Ambulatory Visit: Payer: Self-pay

## 2019-06-12 ENCOUNTER — Encounter (HOSPITAL_COMMUNITY)
Admission: RE | Disposition: A | Discharge status: Home / Self Care | Payer: Self-pay | Source: Home / Self Care | Attending: Orthopedic Surgery

## 2019-06-12 ENCOUNTER — Encounter (HOSPITAL_COMMUNITY): Payer: Self-pay | Admitting: *Deleted

## 2019-06-12 DIAGNOSIS — Z96649 Presence of unspecified artificial hip joint: Secondary | ICD-10-CM

## 2019-06-12 DIAGNOSIS — M1612 Unilateral primary osteoarthritis, left hip: Secondary | ICD-10-CM | POA: Diagnosis not present

## 2019-06-12 DIAGNOSIS — Z888 Allergy status to other drugs, medicaments and biological substances status: Secondary | ICD-10-CM | POA: Diagnosis not present

## 2019-06-12 DIAGNOSIS — Z9621 Cochlear implant status: Secondary | ICD-10-CM | POA: Diagnosis present

## 2019-06-12 DIAGNOSIS — Z820 Family history of epilepsy and other diseases of the nervous system: Secondary | ICD-10-CM

## 2019-06-12 DIAGNOSIS — E785 Hyperlipidemia, unspecified: Secondary | ICD-10-CM | POA: Diagnosis present

## 2019-06-12 DIAGNOSIS — Z471 Aftercare following joint replacement surgery: Secondary | ICD-10-CM | POA: Diagnosis not present

## 2019-06-12 DIAGNOSIS — G4733 Obstructive sleep apnea (adult) (pediatric): Secondary | ICD-10-CM | POA: Diagnosis present

## 2019-06-12 DIAGNOSIS — Z79899 Other long term (current) drug therapy: Secondary | ICD-10-CM

## 2019-06-12 DIAGNOSIS — Z9989 Dependence on other enabling machines and devices: Secondary | ICD-10-CM | POA: Diagnosis not present

## 2019-06-12 DIAGNOSIS — F329 Major depressive disorder, single episode, unspecified: Secondary | ICD-10-CM | POA: Diagnosis present

## 2019-06-12 DIAGNOSIS — Z886 Allergy status to analgesic agent status: Secondary | ICD-10-CM | POA: Diagnosis not present

## 2019-06-12 DIAGNOSIS — H9193 Unspecified hearing loss, bilateral: Secondary | ICD-10-CM | POA: Diagnosis present

## 2019-06-12 DIAGNOSIS — Z20828 Contact with and (suspected) exposure to other viral communicable diseases: Secondary | ICD-10-CM | POA: Diagnosis present

## 2019-06-12 DIAGNOSIS — Z96642 Presence of left artificial hip joint: Secondary | ICD-10-CM | POA: Diagnosis not present

## 2019-06-12 DIAGNOSIS — J45909 Unspecified asthma, uncomplicated: Secondary | ICD-10-CM | POA: Diagnosis present

## 2019-06-12 DIAGNOSIS — Z419 Encounter for procedure for purposes other than remedying health state, unspecified: Secondary | ICD-10-CM

## 2019-06-12 HISTORY — PX: TOTAL HIP ARTHROPLASTY: SHX124

## 2019-06-12 LAB — URINALYSIS, ROUTINE W REFLEX MICROSCOPIC
Bilirubin Urine: NEGATIVE
Glucose, UA: NEGATIVE mg/dL
Hgb urine dipstick: NEGATIVE
Ketones, ur: NEGATIVE mg/dL
Nitrite: NEGATIVE
Protein, ur: 100 mg/dL — AB
Specific Gravity, Urine: 1.024 (ref 1.005–1.030)
pH: 5 (ref 5.0–8.0)

## 2019-06-12 LAB — TYPE AND SCREEN
ABO/RH(D): A NEG
Antibody Screen: NEGATIVE

## 2019-06-12 LAB — CBC
HCT: 32.2 % — ABNORMAL LOW (ref 36.0–46.0)
Hemoglobin: 10.5 g/dL — ABNORMAL LOW (ref 12.0–15.0)
MCH: 31 pg (ref 26.0–34.0)
MCHC: 32.6 g/dL (ref 30.0–36.0)
MCV: 95 fL (ref 80.0–100.0)
Platelets: 245 10*3/uL (ref 150–400)
RBC: 3.39 MIL/uL — ABNORMAL LOW (ref 3.87–5.11)
RDW: 13 % (ref 11.5–15.5)
WBC: 15.5 10*3/uL — ABNORMAL HIGH (ref 4.0–10.5)
nRBC: 0 % (ref 0.0–0.2)

## 2019-06-12 SURGERY — ARTHROPLASTY, HIP, TOTAL, ANTERIOR APPROACH
Anesthesia: Spinal | Site: Hip | Laterality: Left

## 2019-06-12 MED ORDER — DEXAMETHASONE SODIUM PHOSPHATE 10 MG/ML IJ SOLN
10.0000 mg | Freq: Two times a day (BID) | INTRAMUSCULAR | Status: AC
  Administered 2019-06-13 – 2019-06-14 (×3): 10 mg via INTRAVENOUS
  Filled 2019-06-12 (×3): qty 1

## 2019-06-12 MED ORDER — GABAPENTIN 300 MG PO CAPS
300.0000 mg | ORAL_CAPSULE | Freq: Two times a day (BID) | ORAL | Status: DC
  Administered 2019-06-12 – 2019-06-15 (×7): 300 mg via ORAL
  Filled 2019-06-12 (×7): qty 1

## 2019-06-12 MED ORDER — LACTATED RINGERS IV SOLN
INTRAVENOUS | Status: DC
  Administered 2019-06-12: 08:00:00 via INTRAVENOUS

## 2019-06-12 MED ORDER — ONDANSETRON HCL 4 MG/2ML IJ SOLN
INTRAMUSCULAR | Status: DC | PRN
  Administered 2019-06-12: 4 mg via INTRAVENOUS

## 2019-06-12 MED ORDER — SODIUM CHLORIDE 0.9 % IV SOLN
INTRAVENOUS | Status: DC
  Administered 2019-06-12 – 2019-06-13 (×2): via INTRAVENOUS

## 2019-06-12 MED ORDER — ASPIRIN EC 325 MG PO TBEC
325.0000 mg | DELAYED_RELEASE_TABLET | Freq: Two times a day (BID) | ORAL | Status: DC
  Administered 2019-06-12 – 2019-06-15 (×6): 325 mg via ORAL
  Filled 2019-06-12 (×6): qty 1

## 2019-06-12 MED ORDER — WATER FOR IRRIGATION, STERILE IR SOLN
Status: DC | PRN
  Administered 2019-06-12: 2000 mL

## 2019-06-12 MED ORDER — TRANEXAMIC ACID-NACL 1000-0.7 MG/100ML-% IV SOLN
1000.0000 mg | Freq: Once | INTRAVENOUS | Status: AC
  Administered 2019-06-12: 1000 mg via INTRAVENOUS
  Filled 2019-06-12: qty 100

## 2019-06-12 MED ORDER — METHOCARBAMOL 500 MG IVPB - SIMPLE MED
INTRAVENOUS | Status: AC
  Filled 2019-06-12: qty 50

## 2019-06-12 MED ORDER — POLYETHYLENE GLYCOL 3350 17 G PO PACK
17.0000 g | PACK | Freq: Every day | ORAL | Status: DC | PRN
  Administered 2019-06-14: 17 g via ORAL
  Filled 2019-06-12: qty 1

## 2019-06-12 MED ORDER — DOCUSATE SODIUM 100 MG PO CAPS
100.0000 mg | ORAL_CAPSULE | Freq: Two times a day (BID) | ORAL | Status: DC
  Administered 2019-06-12 – 2019-06-15 (×6): 100 mg via ORAL
  Filled 2019-06-12 (×6): qty 1

## 2019-06-12 MED ORDER — ACETAMINOPHEN 325 MG PO TABS
325.0000 mg | ORAL_TABLET | Freq: Four times a day (QID) | ORAL | Status: DC | PRN
  Administered 2019-06-13 – 2019-06-15 (×5): 650 mg via ORAL
  Filled 2019-06-12 (×5): qty 2

## 2019-06-12 MED ORDER — PROPOFOL 10 MG/ML IV BOLUS
INTRAVENOUS | Status: AC
  Filled 2019-06-12: qty 20

## 2019-06-12 MED ORDER — DEXAMETHASONE SODIUM PHOSPHATE 10 MG/ML IJ SOLN
INTRAMUSCULAR | Status: DC | PRN
  Administered 2019-06-12: 8 mg via INTRAVENOUS

## 2019-06-12 MED ORDER — PROPOFOL 10 MG/ML IV BOLUS
INTRAVENOUS | Status: DC | PRN
  Administered 2019-06-12: 10 mg via INTRAVENOUS
  Administered 2019-06-12: 20 mg via INTRAVENOUS

## 2019-06-12 MED ORDER — HYDROMORPHONE HCL 1 MG/ML IJ SOLN
INTRAMUSCULAR | Status: AC
  Filled 2019-06-12: qty 1

## 2019-06-12 MED ORDER — DOCUSATE SODIUM 100 MG PO CAPS: 100.0000 mg | ORAL_CAPSULE | Freq: Two times a day (BID) | ORAL | 0 refills | Status: DC

## 2019-06-12 MED ORDER — ALBUMIN HUMAN 5 % IV SOLN
INTRAVENOUS | Status: DC | PRN
  Administered 2019-06-12 (×2): via INTRAVENOUS

## 2019-06-12 MED ORDER — CEFAZOLIN SODIUM-DEXTROSE 2-4 GM/100ML-% IV SOLN
2.0000 g | Freq: Four times a day (QID) | INTRAVENOUS | Status: AC
  Administered 2019-06-12 (×2): 2 g via INTRAVENOUS
  Filled 2019-06-12 (×2): qty 100

## 2019-06-12 MED ORDER — ACETAMINOPHEN 500 MG PO TABS
1000.0000 mg | ORAL_TABLET | Freq: Once | ORAL | Status: AC
  Administered 2019-06-12: 1000 mg via ORAL
  Filled 2019-06-12: qty 2

## 2019-06-12 MED ORDER — ALPRAZOLAM 0.25 MG PO TABS
0.2500 mg | ORAL_TABLET | Freq: Every day | ORAL | Status: DC | PRN
  Administered 2019-06-13 – 2019-06-14 (×2): 0.25 mg via ORAL
  Filled 2019-06-12 (×2): qty 1

## 2019-06-12 MED ORDER — DEXAMETHASONE SODIUM PHOSPHATE 10 MG/ML IJ SOLN
INTRAMUSCULAR | Status: AC
  Filled 2019-06-12: qty 1

## 2019-06-12 MED ORDER — ONDANSETRON HCL 4 MG/2ML IJ SOLN
INTRAMUSCULAR | Status: AC
  Filled 2019-06-12: qty 2

## 2019-06-12 MED ORDER — ASPIRIN EC 325 MG PO TBEC: 325.0000 mg | DELAYED_RELEASE_TABLET | Freq: Two times a day (BID) | ORAL | 0 refills | Status: DC

## 2019-06-12 MED ORDER — MAGNESIUM CITRATE PO SOLN: 1.0000 | Freq: Once | ORAL | Status: DC | PRN

## 2019-06-12 MED ORDER — MIDAZOLAM HCL 2 MG/2ML IJ SOLN
INTRAMUSCULAR | Status: AC
  Filled 2019-06-12: qty 2

## 2019-06-12 MED ORDER — SODIUM CHLORIDE 0.9 % IV SOLN
INTRAVENOUS | Status: DC | PRN
  Administered 2019-06-12: 25 ug/min via INTRAVENOUS

## 2019-06-12 MED ORDER — ALBUMIN HUMAN 5 % IV SOLN
INTRAVENOUS | Status: AC
  Filled 2019-06-12: qty 500

## 2019-06-12 MED ORDER — CEFAZOLIN SODIUM-DEXTROSE 2-4 GM/100ML-% IV SOLN
2.0000 g | INTRAVENOUS | Status: AC
  Administered 2019-06-12: 2 g via INTRAVENOUS
  Filled 2019-06-12: qty 100

## 2019-06-12 MED ORDER — ONDANSETRON HCL 4 MG/2ML IJ SOLN: 4.0000 mg | Freq: Four times a day (QID) | INTRAMUSCULAR | Status: DC | PRN

## 2019-06-12 MED ORDER — BISACODYL 5 MG PO TBEC: 5.0000 mg | DELAYED_RELEASE_TABLET | Freq: Every day | ORAL | Status: DC | PRN

## 2019-06-12 MED ORDER — PHENYLEPHRINE HCL (PRESSORS) 10 MG/ML IV SOLN
INTRAVENOUS | Status: AC
  Filled 2019-06-12: qty 1

## 2019-06-12 MED ORDER — ALUM & MAG HYDROXIDE-SIMETH 200-200-20 MG/5ML PO SUSP: 30.0000 mL | ORAL | Status: DC | PRN

## 2019-06-12 MED ORDER — POVIDONE-IODINE 10 % EX SWAB
2.0000 "application " | Freq: Once | CUTANEOUS | Status: AC
  Administered 2019-06-12: 2 via TOPICAL

## 2019-06-12 MED ORDER — BUPIVACAINE IN DEXTROSE 0.75-8.25 % IT SOLN
INTRATHECAL | Status: DC | PRN
  Administered 2019-06-12: 1.8 mL via INTRATHECAL

## 2019-06-12 MED ORDER — TRANEXAMIC ACID-NACL 1000-0.7 MG/100ML-% IV SOLN
1000.0000 mg | INTRAVENOUS | Status: AC
  Administered 2019-06-12: 1000 mg via INTRAVENOUS
  Filled 2019-06-12: qty 100

## 2019-06-12 MED ORDER — 0.9 % SODIUM CHLORIDE (POUR BTL) OPTIME
TOPICAL | Status: DC | PRN
  Administered 2019-06-12: 1000 mL

## 2019-06-12 MED ORDER — HYDROMORPHONE HCL 1 MG/ML IJ SOLN
0.5000 mg | INTRAMUSCULAR | Status: DC | PRN
  Administered 2019-06-12: 1 mg via INTRAVENOUS
  Filled 2019-06-12: qty 1

## 2019-06-12 MED ORDER — HYDROMORPHONE HCL 1 MG/ML IJ SOLN
0.2500 mg | INTRAMUSCULAR | Status: DC | PRN
  Administered 2019-06-12: 0.25 mg via INTRAVENOUS
  Administered 2019-06-12 (×3): 0.5 mg via INTRAVENOUS
  Administered 2019-06-12: 0.25 mg via INTRAVENOUS

## 2019-06-12 MED ORDER — METHOCARBAMOL 500 MG IVPB - SIMPLE MED
500.0000 mg | Freq: Four times a day (QID) | INTRAVENOUS | Status: DC | PRN
  Administered 2019-06-12: 500 mg via INTRAVENOUS
  Filled 2019-06-12: qty 50

## 2019-06-12 MED ORDER — BUPIVACAINE HCL (PF) 0.25 % IJ SOLN
INTRAMUSCULAR | Status: AC
  Filled 2019-06-12: qty 30

## 2019-06-12 MED ORDER — MIDAZOLAM HCL 2 MG/2ML IJ SOLN
INTRAMUSCULAR | Status: DC | PRN
  Administered 2019-06-12: 2 mg via INTRAVENOUS

## 2019-06-12 MED ORDER — CHLORHEXIDINE GLUCONATE 4 % EX LIQD: 60.0000 mL | Freq: Once | CUTANEOUS | Status: DC

## 2019-06-12 MED ORDER — BUPIVACAINE LIPOSOME 1.3 % IJ SUSP
INTRAMUSCULAR | Status: DC | PRN
  Administered 2019-06-12: 20 mL

## 2019-06-12 MED ORDER — PROPOFOL 500 MG/50ML IV EMUL
INTRAVENOUS | Status: AC
  Filled 2019-06-12: qty 50

## 2019-06-12 MED ORDER — HYDROMORPHONE HCL 1 MG/ML IJ SOLN
0.5000 mg | INTRAMUSCULAR | Status: AC | PRN
  Administered 2019-06-12 (×2): 0.5 mg via INTRAVENOUS

## 2019-06-12 MED ORDER — BUPIVACAINE HCL (PF) 0.25 % IJ SOLN
INTRAMUSCULAR | Status: DC | PRN
  Administered 2019-06-12: 30 mL

## 2019-06-12 MED ORDER — METHOCARBAMOL 500 MG PO TABS
500.0000 mg | ORAL_TABLET | Freq: Four times a day (QID) | ORAL | Status: DC | PRN
  Administered 2019-06-13 – 2019-06-15 (×8): 500 mg via ORAL
  Filled 2019-06-12 (×8): qty 1

## 2019-06-12 MED ORDER — ONDANSETRON HCL 4 MG PO TABS: 4.0000 mg | ORAL_TABLET | Freq: Four times a day (QID) | ORAL | Status: DC | PRN

## 2019-06-12 MED ORDER — OXYCODONE HCL 5 MG PO TABS
5.0000 mg | ORAL_TABLET | ORAL | Status: DC | PRN
  Administered 2019-06-12: 10 mg via ORAL
  Administered 2019-06-12: 5 mg via ORAL
  Administered 2019-06-12 – 2019-06-13 (×3): 10 mg via ORAL
  Administered 2019-06-13: 5 mg via ORAL
  Administered 2019-06-13 – 2019-06-14 (×2): 10 mg via ORAL
  Administered 2019-06-14: 5 mg via ORAL
  Administered 2019-06-14 – 2019-06-15 (×3): 10 mg via ORAL
  Filled 2019-06-12 (×7): qty 2
  Filled 2019-06-12 (×2): qty 1
  Filled 2019-06-12 (×3): qty 2

## 2019-06-12 MED ORDER — ZOLPIDEM TARTRATE 5 MG PO TABS
5.0000 mg | ORAL_TABLET | Freq: Every evening | ORAL | Status: DC | PRN
  Administered 2019-06-13 – 2019-06-14 (×3): 5 mg via ORAL
  Filled 2019-06-12 (×3): qty 1

## 2019-06-12 MED ORDER — PHENYLEPHRINE 40 MCG/ML (10ML) SYRINGE FOR IV PUSH (FOR BLOOD PRESSURE SUPPORT)
PREFILLED_SYRINGE | INTRAVENOUS | Status: DC | PRN
  Administered 2019-06-12: 80 ug via INTRAVENOUS
  Administered 2019-06-12 (×2): 120 ug via INTRAVENOUS

## 2019-06-12 MED ORDER — DULOXETINE HCL 60 MG PO CPEP
60.0000 mg | ORAL_CAPSULE | Freq: Every day | ORAL | Status: DC
  Administered 2019-06-13 – 2019-06-15 (×3): 60 mg via ORAL
  Filled 2019-06-12 (×3): qty 1

## 2019-06-12 MED ORDER — DIPHENHYDRAMINE HCL 12.5 MG/5ML PO ELIX
12.5000 mg | ORAL_SOLUTION | ORAL | Status: DC | PRN
  Administered 2019-06-12 (×2): 25 mg via ORAL
  Administered 2019-06-13: 12.5 mg via ORAL
  Administered 2019-06-13 – 2019-06-14 (×2): 25 mg via ORAL
  Administered 2019-06-14 (×2): 12.5 mg via ORAL
  Filled 2019-06-12: qty 5
  Filled 2019-06-12 (×3): qty 10
  Filled 2019-06-12: qty 5
  Filled 2019-06-12: qty 10
  Filled 2019-06-12: qty 5
  Filled 2019-06-12 (×2): qty 10

## 2019-06-12 MED ORDER — PHENYLEPHRINE 40 MCG/ML (10ML) SYRINGE FOR IV PUSH (FOR BLOOD PRESSURE SUPPORT)
PREFILLED_SYRINGE | INTRAVENOUS | Status: AC
  Filled 2019-06-12: qty 10

## 2019-06-12 MED ORDER — OXYCODONE-ACETAMINOPHEN 5-325 MG PO TABS: 1.0000 | ORAL_TABLET | Freq: Four times a day (QID) | ORAL | 0 refills | Status: DC | PRN

## 2019-06-12 MED ORDER — PROPOFOL 500 MG/50ML IV EMUL
INTRAVENOUS | Status: DC | PRN
  Administered 2019-06-12: 125 ug/kg/min via INTRAVENOUS

## 2019-06-12 SURGICAL SUPPLY — 41 items
APL SKNCLS STERI-STRIP NONHPOA (GAUZE/BANDAGES/DRESSINGS)
BAG SPEC THK2 15X12 ZIP CLS (MISCELLANEOUS)
BAG ZIPLOCK 12X15 (MISCELLANEOUS) IMPLANT
BALL HIP CERAMIC (Hips) IMPLANT
BENZOIN TINCTURE PRP APPL 2/3 (GAUZE/BANDAGES/DRESSINGS) IMPLANT
BLADE SAW SGTL 18X1.27X75 (BLADE) ×2 IMPLANT
BLADE SURG SZ10 CARB STEEL (BLADE) ×4 IMPLANT
COVER PERINEAL POST (MISCELLANEOUS) ×2 IMPLANT
COVER SURGICAL LIGHT HANDLE (MISCELLANEOUS) ×2 IMPLANT
COVER WAND RF STERILE (DRAPES) IMPLANT
DRAPE STERI IOBAN 125X83 (DRAPES) ×2 IMPLANT
DRAPE U-SHAPE 47X51 STRL (DRAPES) ×4 IMPLANT
DRSG AQUACEL AG ADV 3.5X 6 (GAUZE/BANDAGES/DRESSINGS) ×2 IMPLANT
DRSG CURAD 3X16 NADH (PACKING) ×1 IMPLANT
DURAPREP 26ML APPLICATOR (WOUND CARE) ×2 IMPLANT
ELECT BLADE TIP CTD 4 INCH (ELECTRODE) ×2 IMPLANT
ELECT REM PT RETURN 15FT ADLT (MISCELLANEOUS) ×2 IMPLANT
ELIMINATOR HOLE APEX DEPUY (Hips) ×1 IMPLANT
GAUZE XEROFORM 1X8 LF (GAUZE/BANDAGES/DRESSINGS) IMPLANT
GLOVE BIOGEL PI IND STRL 8 (GLOVE) ×2 IMPLANT
GLOVE BIOGEL PI INDICATOR 8 (GLOVE) ×2
GLOVE ECLIPSE 7.5 STRL STRAW (GLOVE) ×4 IMPLANT
GOWN STRL REUS W/TWL XL LVL3 (GOWN DISPOSABLE) ×4 IMPLANT
HIP BALL CERAMIC (Hips) ×2 IMPLANT
HOLDER FOLEY CATH W/STRAP (MISCELLANEOUS) ×2 IMPLANT
HOOD PEEL AWAY FLYTE STAYCOOL (MISCELLANEOUS) ×4 IMPLANT
KIT TURNOVER KIT A (KITS) IMPLANT
LINER ACET PNNCL PLUS4 NEUTRAL (Hips) IMPLANT
LINER PINN ACET GRIP 50X100 ×1 IMPLANT
NEEDLE HYPO 22GX1.5 SAFETY (NEEDLE) ×2 IMPLANT
PACK ANTERIOR HIP CUSTOM (KITS) ×2 IMPLANT
PINNACLE PLUS 4 NEUTRAL (Hips) ×2 IMPLANT
STAPLER VISISTAT 35W (STAPLE) IMPLANT
STEM CORAIL KA09 (Stem) ×1 IMPLANT
STRIP CLOSURE SKIN 1/2X4 (GAUZE/BANDAGES/DRESSINGS) IMPLANT
SUT ETHIBOND NAB CT1 #1 30IN (SUTURE) ×4 IMPLANT
SUT MNCRL AB 3-0 PS2 18 (SUTURE) IMPLANT
SUT VIC AB 0 CT1 36 (SUTURE) ×3 IMPLANT
SUT VIC AB 1 CT1 36 (SUTURE) ×2 IMPLANT
TRAY FOLEY MTR SLVR 16FR STAT (SET/KITS/TRAYS/PACK) ×2 IMPLANT
YANKAUER SUCT BULB TIP 10FT TU (MISCELLANEOUS) ×2 IMPLANT

## 2019-06-12 NOTE — Anesthesia Procedure Notes (Signed)
Spinal  Patient location during procedure: OR Start time: 06/12/2019 10:35 AM End time: 06/12/2019 10:39 AM Staffing Resident/CRNA: Niel Hummer, CRNA Performed: resident/CRNA  Preanesthetic Checklist Completed: patient identified, surgical consent, pre-op evaluation, IV checked, risks and benefits discussed and monitors and equipment checked Spinal Block Prep: DuraPrep Patient monitoring: heart rate, continuous pulse ox and blood pressure Approach: midline Location: L3-4 Injection technique: single-shot Needle Needle type: Pencan  Needle gauge: 24 G Needle length: 5 cm

## 2019-06-12 NOTE — Transfer of Care (Signed)
Immediate Anesthesia Transfer of Care Note  Patient: Rhonda Simmons  Procedure(s) Performed: TOTAL HIP ARTHROPLASTY ANTERIOR APPROACH (Left Hip)  Patient Location: PACU  Anesthesia Type:Spinal  Level of Consciousness: awake  Airway & Oxygen Therapy: Patient Spontanous Breathing and Patient connected to face mask oxygen  Post-op Assessment: Report given to RN and Post -op Vital signs reviewed and stable  Post vital signs: Reviewed and stable  Last Vitals:  Vitals Value Taken Time  BP 133/73 06/12/19 1252  Temp    Pulse 86 06/12/19 1254  Resp 16 06/12/19 1254  SpO2 100 % 06/12/19 1254  Vitals shown include unvalidated device data.  Last Pain:  Vitals:   06/12/19 0817  TempSrc: Oral  PainSc: 0-No pain      Patients Stated Pain Goal: 3 (27/74/12 8786)  Complications: No apparent anesthesia complications

## 2019-06-12 NOTE — Brief Op Note (Signed)
06/12/2019  12:30 PM  PATIENT:  Rhonda Simmons  58 y.o. female  PRE-OPERATIVE DIAGNOSIS:  OSTEOARTHRITIS LEFT HIP  POST-OPERATIVE DIAGNOSIS:  OSTEOARTHRITIS LEFT HIP  PROCEDURE:  Procedure(s): TOTAL HIP ARTHROPLASTY ANTERIOR APPROACH (Left)  SURGEON:  Surgeon(s) and Role:    Dorna Leitz, MD - Primary  PHYSICIAN ASSISTANT:   ASSISTANTS: jim bethune   ANESTHESIA:   spinal  EBL:  1025 mL   BLOOD ADMINISTERED:none  DRAINS: none   LOCAL MEDICATIONS USED:  MARCAINE    and OTHER experel  SPECIMEN:  No Specimen  DISPOSITION OF SPECIMEN:  N/A  COUNTS:  YES  TOURNIQUET:  * No tourniquets in log *  DICTATION: .Other Dictation: Dictation Number [id number]  PLAN OF CARE: Admit for overnight observation  PATIENT DISPOSITION:  PACU - hemodynamically stable.   Delay start of Pharmacological VTE agent (>24hrs) due to surgical blood loss or risk of bleeding: no

## 2019-06-12 NOTE — Anesthesia Postprocedure Evaluation (Signed)
Anesthesia Post Note  Patient: COBIE LEIDNER  Procedure(s) Performed: TOTAL HIP ARTHROPLASTY ANTERIOR APPROACH (Left Hip)     Patient location during evaluation: PACU Anesthesia Type: Spinal Level of consciousness: oriented and awake and alert Pain management: pain level controlled Vital Signs Assessment: post-procedure vital signs reviewed and stable Respiratory status: spontaneous breathing, respiratory function stable and patient connected to nasal cannula oxygen Cardiovascular status: blood pressure returned to baseline and stable Postop Assessment: no headache, no backache, no apparent nausea or vomiting, spinal receding and patient able to bend at knees Anesthetic complications: no    Last Vitals:  Vitals:   06/12/19 1430 06/12/19 1444  BP: 130/75 127/70  Pulse: 88 98  Resp: 16 14  Temp:  36.7 C  SpO2: 100% 94%    Last Pain:  Vitals:   06/12/19 1444  TempSrc:   PainSc: 6     LLE Motor Response: Purposeful movement (06/12/19 1444) LLE Sensation: Decreased (06/12/19 1444) RLE Motor Response: Purposeful movement (06/12/19 1444) RLE Sensation: Decreased (06/12/19 1444) L Sensory Level: S1-Sole of foot, small toes (06/12/19 1444) R Sensory Level: S1-Sole of foot, small toes (06/12/19 1444)  Sakai Heinle,W. EDMOND

## 2019-06-12 NOTE — H&P (Signed)
TOTAL HIP ADMISSION H&P  Patient is admitted for left total hip arthroplasty.  Subjective:  Chief Complaint: left hip pain  HPI: Rhonda Simmons, 58 y.o. female, has a history of pain and functional disability in the left hip(s) due to arthritis and patient has failed non-surgical conservative treatments for greater than 12 weeks to include NSAID's and/or analgesics, use of assistive devices, weight reduction as appropriate and activity modification.  Onset of symptoms was gradual starting 3 years ago with gradually worsening course since that time.The patient noted no past surgery on the left hip(s).  Patient currently rates pain in the left hip at 8 out of 10 with activity. Patient has night pain, worsening of pain with activity and weight bearing, trendelenberg gait, pain that interfers with activities of daily living, pain with passive range of motion and joint swelling. Patient has evidence of subchondral cysts, subchondral sclerosis, joint subluxation and joint space narrowing by imaging studies. This condition presents safety issues increasing the risk of falls. This patient has had Failure of all reasonable conservative care.  There is no current active infection.  Patient Active Problem List   Diagnosis Date Noted  . OSA on CPAP 10/16/2018  . Myalgia 04/02/2018  . Myopathy 04/02/2018  . Neck stiffness 04/02/2018  . Pain in joint, multiple sites 04/02/2018  . Retro-orbital pain, bilateral 03/18/2017  . Blurred vision, bilateral 03/18/2017  . OSA (obstructive sleep apnea) 03/18/2017  . Super obese 03/18/2017  . Dysesthesia of multiple sites 02/03/2013  . Neuropathy   . Hearing loss of both ears   . LEG PAIN, RIGHT 05/06/2008  . EUSTACHIAN TUBE DYSFUNCTION 01/10/2008  . HEARING IMPAIRMENT 10/27/2007  . ALLERGIC RHINITIS 10/27/2007  . ASTHMA 10/27/2007  . INSOMNIA 10/27/2007  . HEADACHE 10/27/2007  . DYSPHAGIA UNSPECIFIED 10/27/2007  . ANGIOEDEMA 10/27/2007   Past Medical  History:  Diagnosis Date  . Asthma    pt denies  . Chronic bronchitis    Dr. Maple Hudson  . Complication of anesthesia    pt stated woke up too soon, crying  . Depression   . Diverticulosis    pt unaware  . Fatty liver   . Hearing impaired   . 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 History of carpal tunnel syndrome   . Hyperlipidemia   . Migraines   . Neuropathy    pt unaware  . OA (osteoarthritis)   . Obesity   . OSA (obstructive sleep apnea)   . Plantar fasciitis, right   . Umbilical hernia     Past Surgical History:  Procedure Laterality Date  . ABDOMINAL HYSTERECTOMY    . CARPAL TUNNEL RELEASE Right   . CESAREAN SECTION     x2  . COCHLEAR IMPLANT Left   . COCHLEAR IMPLANT REVISION Left 01/2016  . COLONOSCOPY    . DENTAL SURGERY    . NECK SURGERY     Rods and screws placed  . SHOULDER ARTHROSCOPY Right   . UPPER GI ENDOSCOPY      Current Facility-Administered Medications  Medication Dose Route Frequency Provider Last Rate Last Dose  . bupivacaine liposome (EXPAREL) 1.3 % injection 266 mg  20 mL Other On Call to OR Jodi Geralds, MD       Current Outpatient Medications  Medication Sig Dispense Refill Last Dose  . ALPRAZolam (XANAX) 0.25 MG tablet Take 0.25 mg by mouth daily as needed for anxiety.      . Biotin 5000 MCG TABS Take  5,000 mcg by mouth daily.     . celecoxib (CELEBREX) 200 MG capsule Take 200 mg by mouth daily.     . cyclobenzaprine (FLEXERIL) 10 MG tablet Take 10 mg by mouth 3 (three) times daily as needed for muscle spasms.     . DULoxetine (CYMBALTA) 60 MG capsule Take 60 mg by mouth daily.   6   . EPINEPHrine (EPIPEN 2-PAK) 0.3 mg/0.3 mL IJ SOAJ injection Inject 0.3 mLs (0.3 mg total) into the muscle once as needed (for severe allergic reaction). CAll 911 immediately if you have to use this medicine 1 Device 0   . HYDROcodone-acetaminophen (NORCO) 10-325 MG tablet Take 1 tablet by mouth every 6 (six) hours as needed for moderate pain.     .  hydrOXYzine (ATARAX/VISTARIL) 25 MG tablet Take 25 mg by mouth every 6 (six) hours as needed for itching.      . liraglutide (VICTOZA) 18 MG/3ML SOPN Inject 2 mg into the skin daily.      Marland Kitchen. zolpidem (AMBIEN) 10 MG tablet Take 10 mg by mouth at bedtime as needed for sleep.   0   . cephALEXin (KEFLEX) 500 MG capsule Take 1 capsule (500 mg total) by mouth 3 (three) times daily. (Patient not taking: Reported on 06/03/2019) 21 capsule 0 Not Taking at Unknown time  . fluconazole (DIFLUCAN) 150 MG tablet Take 1 tablet (150 mg total) by mouth once. (Patient not taking: Reported on 06/03/2019) 1 tablet 1 Not Taking at Unknown time  . predniSONE (DELTASONE) 10 MG tablet Take 3 tab in AM - for 7 days, followed by 2 tabs for 7 days, followed by 1 tab for 14 days (Patient not taking: Reported on 06/03/2019) 36 tablet 0 Not Taking at Unknown time  . topiramate (TOPAMAX) 50 MG tablet Take 1 tablet (50 mg total) by mouth 2 (two) times daily. (Patient not taking: Reported on 06/03/2019) 60 tablet 12 Not Taking at Unknown time   Allergies  Allergen Reactions  . Compazine     convulsions  . Fexofenadine     D version makes headaches worse  . Midol [Aspirin-Cinnamedrine-Caffeine]     unknown  . Naproxen Sodium     unknown    Social History   Tobacco Use  . Smoking status: Never Smoker  . Smokeless tobacco: Never Used  Substance Use Topics  . Alcohol use: Yes    Comment: occas    Family History  Problem Relation Age of Onset  . Alzheimer's disease Mother   . Alzheimer's disease Father      ROS ROS: I have reviewed the patient's review of systems thoroughly and there are no positive responses as relates to the HPI. Objective:  Physical Exam  Vital signs in last 24 hours:   Well-developed well-nourished patient in no acute distress. Alert and oriented x3 HEENT:within normal limits Cardiac: Regular rate and rhythm Pulmonary: Lungs clear to auscultation Abdomen: Soft and nontender.  Normal active  bowel sounds  Musculoskeletal: Left hip: Painful range of motion.  Limited range of motion.  No instability.  Neurovascular intact distally. Labs: Recent Results (from the past 2160 hour(s))  Glucose, capillary     Status: Abnormal   Collection Time: 06/05/19  9:34 AM  Result Value Ref Range   Glucose-Capillary 112 (H) 70 - 99 mg/dL  APTT     Status: None   Collection Time: 06/05/19 10:41 AM  Result Value Ref Range   aPTT 32 24 - 36 seconds  Comment: Performed at [facility], [address], [place], [place] [address]  CBC WITH DIFFERENTIAL     Status: Abnormal   Collection Time: 06/05/19 10:41 AM  Result Value Ref Range   WBC 8.9 4.0 - 10.5 K/uL   RBC 4.64 3.87 - 5.11 MIL/uL   Hemoglobin 13.9 12.0 - 15.0 g/dL   HCT 76.7 20.9 - 47.0 %   MCV 94.4 80.0 - 100.0 fL   MCH 30.0 26.0 - 34.0 pg   MCHC 31.7 30.0 - 36.0 g/dL   RDW 96.2 83.6 - 62.9 %   Platelets 308 150 - 400 K/uL   nRBC 0.0 0.0 - 0.2 %   Neutrophils Relative % 54 %   Neutro Abs 4.9 1.7 - 7.7 K/uL   Lymphocytes Relative 30 %   Lymphs Abs 2.7 0.7 - 4.0 K/uL   Monocytes Relative 10 %   Monocytes Absolute 0.9 0.1 - 1.0 K/uL   Eosinophils Relative 4 %   Eosinophils Absolute 0.3 0.0 - 0.5 K/uL   Basophils Relative 1 %   Basophils Absolute 0.1 0.0 - 0.1 K/uL   Immature Granulocytes 1 %   Abs Immature Granulocytes 0.10 (H) 0.00 - 0.07 K/uL    Comment: Performed at [facility], [address], [place], [place] [address]  Comprehensive metabolic panel     Status: None   Collection Time: 06/05/19 10:41 AM  Result Value Ref Range   Sodium 138 135 - 145 mmol/L   Potassium 4.0 3.5 - 5.1 mmol/L   Chloride 103 98 - 111 mmol/L   CO2 25 22 - 32 mmol/L   Glucose, Bld 88 70 - 99 mg/dL   BUN 20 6 - 20 mg/dL   Creatinine, Ser 6.50 0.44 - 1.00 mg/dL   Calcium 9.1 8.9 - 35.4 mg/dL   Total Protein 7.2 6.5 - 8.1 g/dL   Albumin 4.5 3.5 - 5.0 g/dL   AST 22 15 - 41 U/L   ALT 34 0 - 44 U/L    Alkaline Phosphatase 57 38 - 126 U/L   Total Bilirubin 0.5 0.3 - 1.2 mg/dL   GFR calc non Af Amer >60 >60 mL/min   GFR calc Af Amer >60 >60 mL/min   Anion gap 10 5 - 15    Comment: Performed at [facility], [address] [place], [place] [address]  Protime-INR     Status: Abnormal   Collection Time: 06/05/19 10:41 AM  Result Value Ref Range   Prothrombin Time 11.0 (L) 11.4 - 15.2 seconds   INR 0.8 0.8 - 1.2    Comment: (NOTE) INR goal varies based on device and disease states. Performed at [facility], [address] [place], [place] [address]   Type and screen Order type and screen if day of surgery is less than 15 days from draw of preadmission visit or order morning of surgery if day of surgery is greater than 6 days from preadmission visit.     Status: None   Collection Time: 06/05/19 10:41 AM  Result Value Ref Range   ABO/RH(D) A NEG    Antibody Screen NEG    Sample Expiration 06/15/2019,2359    Extend sample reason      NO TRANSFUSIONS OR PREGNANCY IN THE PAST 3 MONTHS Performed at [facility], [address] [place], [place] [address]   Surgical pcr screen     Status: Abnormal   Collection Time: 06/05/19 10:41 AM  Specimen: Nasal Swab  Result Value Ref Range   MRSA, PCR NEGATIVE NEGATIVE   Staphylococcus aureus POSITIVE (A) NEGATIVE    Comment: (NOTE) The Xpert SA Assay (FDA approved for NASAL specimens in patients 20 years of age and older), is one component of a comprehensive surveillance program. It is not intended to diagnose infection nor to guide or monitor treatment. Performed at [facility], [place] [address] [place], [place] [address]   Hemoglobin A1c     Status: None   Collection Time: 06/05/19 10:41 AM  Result Value Ref Range   Hgb A1c MFr Bld 5.6 4.8 - 5.6 %    Comment: (NOTE) Pre diabetes:          5.7%-6.4% Diabetes:              >6.4% Glycemic control for    <7.0% adults with diabetes    Mean Plasma Glucose 114.02 mg/dL    Comment: Performed at [facility] [address] [place], [place] [address]  ABO/Rh     Status: None   Collection Time: 06/05/19 10:41 AM  Result Value Ref Range   ABO/RH(D)      A NEG Performed at [facility] [address] [place], [place] [address]   Novel Coronavirus, NAA [facility] order, Send-out to Ref Lab; TAT 18-24 hrs     Status: None   Collection Time: 06/09/19  2:54 PM   Specimen: Nasopharyngeal Swab; Respiratory  Result Value Ref Range   SARS-CoV-2, NAA NOT DETECTED NOT DETECTED    Comment: (NOTE) This nucleic acid amplification test was developed and its performance characteristics determined by [employer]. Nucleic acid amplification tests include PCR and TMA. This test has not been FDA cleared or approved. This test has been authorized by FDA under an Emergency Use Authorization (EUA). This test is only authorized for the duration of time the declaration that circumstances exist justifying the authorization of the emergency use of in vitro diagnostic tests for detection of SARS-CoV-2 virus and/or diagnosis of COVID-19 infection under section 564(b)(1) of the Act, 21 U.S.C. 423NTI-1(W) (1), unless the authorization is terminated or revoked sooner. When diagnostic testing is negative, the possibility of a false negative result should be considered in the context of a patient's recent exposures and the presence of clinical signs and symptoms consistent with COVID-19. An individual without symptoms of COVID- 19 and who is not shedding SARS-CoV-2 vi rus would expect to have a negative (not detected) result in this assay. Performed At: [facility] [place], [place] [id number] Rush Farmer MD PY:[id number]    Coronavirus Source NASOPHARYNGEAL     Comment: Performed at [facility], [place] [address] [place], [place] [address]     Estimated body mass index is 41.53 kg/m as calculated from the following:   Height as of 06/05/19: 5\' 2"  (1.575 m).   Weight as of 06/05/19: 103 kg.   Imaging Review Plain radiographs demonstrate severe degenerative joint disease of the left hip(s). The bone quality appears to be fair for age and reported activity level.      Assessment/Plan:  End stage arthritis, left hip(s)  The patient history, physical examination, clinical judgement of the provider and imaging studies are consistent with end stage degenerative joint disease of the left hip(s) and total hip arthroplasty is deemed medically necessary. The treatment options including medical management, injection therapy, arthroscopy and arthroplasty were discussed at length. The risks and benefits of  total hip arthroplasty were presented and reviewed. The risks due to aseptic loosening, infection, stiffness, dislocation/subluxation,  thromboembolic complications and other imponderables were discussed.  The patient acknowledged the explanation, agreed to proceed with the plan and consent was signed. Patient is being admitted for inpatient treatment for surgery, pain control, PT, OT, prophylactic antibiotics, VTE prophylaxis, progressive ambulation and ADL's and discharge planning.The patient is planning to be discharged home with home health services

## 2019-06-12 NOTE — Anesthesia Procedure Notes (Signed)
Procedure Name: MAC Date/Time: 06/12/2019 10:32 AM Performed by: Niel Hummer, CRNA Pre-anesthesia Checklist: Patient identified, Emergency Drugs available, Suction available and Patient being monitored Patient Re-evaluated:Patient Re-evaluated prior to induction Oxygen Delivery Method: Simple face mask

## 2019-06-12 NOTE — Op Note (Signed)
NAME: JANKI, DIKE MEDICAL RECORD YS:[id number] ACCOUNT [id number] DATE OF BIRTH:August 17, 1961 FACILITY: [facility] LOCATION: [id number] PHYSICIAN:Concettina Leth L. Toshi Ishii, MD  OPERATIVE REPORT  DATE OF PROCEDURE:  06/12/2019  PREOPERATIVE DIAGNOSIS:  End-stage degenerative joint disease, left hip.  POSTOPERATIVE DIAGNOSIS:  End-stage degenerative joint disease, left hip.  PROCEDURE: 1.  Left total hip replacement with a Corail stem size 9, a 50 mm Pinnacle Gription cup, a +4 neutral liner, and a 36 mm +1.5 delta ceramic hip ball. 2.  Interpretation of multiple intraoperative fluoroscopic images.  SURGEON:  Jodi Geralds, MD  ASSISTANT:  Gus Puma, PA-C, was present for the entire case and assisted by retraction of tissues, manipulation of the leg, and closing to minimize OR time.  BRIEF HISTORY:  The patient is a 58 year old female with a long history of significant complaints of left hip pain.  She has been treated conservatively for a prolonged period of time.  After failure of all conservative care, she was taken to the  operating room for left total hip replacement.  She had x-rays showing bone-on-bone change.  She was having night pain and light activity pain.  Because of failure of all conservative care, she was taken to the operating room for this procedure.  DESCRIPTION OF PROCEDURE:  The patient was taken to the operating room after adequate anesthesia was obtained with a spinal anesthetic.  The patient was placed supine on the operating table.  The left hip was then prepped and draped in the usual sterile  fashion.  She was placed on the Hana bed, and all bony prominences were well padded.  An incision was made for an anterior approach to the hip.  Subcutaneous tissue down to the level of the tensor fascia was clearly identified and divided in the length  of its fibers.  The muscle was then finger dissected and retractors put in place above and below the femoral neck.  A capsulotomy was  undertaken, and tagged stitches were put in place.  The hip was externally rotated, and the anterior capsule was  released following this.  Attention was turned towards the hip where a provisional neck cut was made.  Retractors put in place above and below the acetabulum, and the acetabulum was sequentially reamed to a level of 49 mm.  A 50 mm Pinnacle Gription cup  was opened and hammered into place 45 degrees of lateral opening, 30 degrees of anteversion.  Once this was completed, attention was turned to the stem, which was externally rotated, extended, and adducted.  The canal was then opened with a cookie  cutter, followed by a chili pepper, followed by sequential reaming up to a level of 9 mm.  A 9 mm stem was completely tight.  I went to the 10 and really could not get it down at all.  At that point, we went back to the 9.  We got a trial reduction with  a +0 ball with a 9.  It looked to be the perfect stem, and the leg length was slightly short at this point.  At this point, we redislocated the hip, put the 9 mm final, tested it with a +5 ball trial, re-looked at the stem, and the length was +5.  I got  a perfect length.  At this point, the wounds were irrigated and suctioned dry.  The +5 was opened and placed and the hip reduced.  Final images were taken showing anatomic alignment of her hip implant as well as symmetric leg lengths.  At this time, the  anterior capsule was closed with 1 Vicryl running.  The tensor fascia was closed with 0 Vicryl running, skin was closed with 0 and 2-0 Vicryl, and skin staples because of her large size.  A sterile compressive dressing was applied.  The patient was taken  to recovery and was noted to be in satisfactory condition.  Estimated blood loss for procedure was 1000 mL.  LN/NUANCE  D:06/12/2019 T:06/12/2019 JOB:[id number]

## 2019-06-12 NOTE — Discharge Instructions (Signed)

## 2019-06-12 NOTE — Evaluation (Signed)
Physical Therapy Evaluation Patient Details Name: GENNETT GARCIA MRN: [id number] DOB: 11-18-1960 Today's Date: 06/12/2019   History of Present Illness  s/p L THA. PMHx: hearing impairment,  cochlear implants, obesity  Clinical Impression  Pt is s/p THA resulting in the deficits listed below (see PT Problem List).  Pt amb 50' with RW and min assist. Anticipate steady progress in acute setting.  Pt is hopeful  To d/c home possibly tomorrow. Pt will benefit from skilled PT to increase their independence and safety with mobility to allow discharge to the venue listed below.      Follow Up Recommendations Follow surgeon's recommendation for DC plan and follow-up therapies    Equipment Recommendations  None recommended by PT    Recommendations for Other Services       Precautions / Restrictions Precautions Precautions: Fall Restrictions Weight Bearing Restrictions: No Other Position/Activity Restrictions: WBAT      Mobility  Bed Mobility Overal bed mobility: Needs Assistance Bed Mobility: Supine to Sit     Supine to sit: Min assist     General bed mobility comments: assist with LLE,incr time  Transfers Overall transfer level: Needs assistance Equipment used: Rolling walker (2 wheeled) Transfers: Sit to/from Stand Sit to Stand: Min assist         General transfer comment: assist with anterior-superior wt shift,  multi-modal cues for hand placement  and LLE position  Ambulation/Gait Ambulation/Gait assistance: Min guard;Min assist   Assistive device: Rolling walker (2 wheeled) Gait Pattern/deviations: Step-through pattern;Step-to pattern        Stairs            Wheelchair Mobility    Modified Rankin (Stroke Patients Only)       Balance                                             Pertinent Vitals/Pain Pain Assessment: 0-10 Pain Score: 7  Pain Location: left hip Pain Descriptors / Indicators: Grimacing;Sore;Aching Pain  Intervention(s): Limited activity within patient's tolerance;Monitored during session;Repositioned;Ice applied(RN holding meds until after session)    Home Living Family/patient expects to be discharged to:: Private residence Living Arrangements: Spouse/significant other;Parent Available Help at Discharge: Family Type of Home: House Home Access: Ramped entrance;Stairs to enter   Secretary/administrator of Steps: 3 Home Layout: One level Home Equipment: Environmental consultant - 2 wheels      Prior Function Level of Independence: Independent               Hand Dominance        Extremity/Trunk Assessment   Upper Extremity Assessment Upper Extremity Assessment: Overall WFL for tasks assessed    Lower Extremity Assessment Lower Extremity Assessment: LLE deficits/detail LLE Deficits / Details: ankle WFL, knee and hip grossly 2+/5, limited by pain and post op weakness       Communication   Communication: Deaf  Cognition Arousal/Alertness: Awake/alert Behavior During Therapy: WFL for tasks assessed/performed Overall Cognitive Status: Within Functional Limits for tasks assessed                                        General Comments      Exercises     Assessment/Plan    PT Assessment Patient needs continued PT services  PT Problem List Decreased  strength;Decreased range of motion;Decreased mobility;Pain;Decreased knowledge of use of DME;Decreased activity tolerance       PT Treatment Interventions DME instruction;Therapeutic activities;Gait training;Functional mobility training;Therapeutic exercise;Patient/family education;Stair training    PT Goals (Current goals can be found in the Care Plan section)  Acute Rehab PT Goals Patient Stated Goal: less hip pain PT Goal Formulation: With patient Time For Goal Achievement: 06/19/19 Potential to Achieve Goals: Good    Frequency 7X/week   Barriers to discharge        Co-evaluation                AM-PAC PT "6 Clicks" Mobility  Outcome Measure Help needed turning from your back to your side while in a flat bed without using bedrails?: A Little Help needed moving from lying on your back to sitting on the side of a flat bed without using bedrails?: A Little Help needed moving to and from a bed to a chair (including a wheelchair)?: A Little Help needed standing up from a chair using your arms (e.g., wheelchair or bedside chair)?: A Little Help needed to walk in hospital room?: A Little Help needed climbing 3-5 steps with a railing? : A Lot 6 Click Score: 17    End of Session Equipment Utilized During Treatment: Gait belt Activity Tolerance: Patient tolerated treatment well Patient left: with call bell/phone within reach;with family/visitor present;in chair;with chair alarm set   PT Visit Diagnosis: Difficulty in walking, not elsewhere classified (R26.2)    Time: 2836-6294 PT Time Calculation (min) (ACUTE ONLY): 29 min   Charges:   PT Evaluation $PT Eval Low Complexity: 1 Low PT Treatments $Gait Training: 8-22 mins        Kenyon Ana, PT  Pager: [phone] Acute Rehab Dept [facility]): [phone]   06/12/2019   [facility] 06/12/2019, 5:15 PM

## 2019-06-12 NOTE — Anesthesia Preprocedure Evaluation (Addendum)
Anesthesia Evaluation  Patient identified by MRN, date of birth, ID band Patient awake    Reviewed: Allergy & Precautions, H&P , NPO status , Patient's Chart, lab work & pertinent test results  Airway Mallampati: II  TM Distance: >3 FB Neck ROM: Full    Dental no notable dental hx. (+) Teeth Intact, Dental Advisory Given   Pulmonary asthma , sleep apnea ,    Pulmonary exam normal breath sounds clear to auscultation       Cardiovascular negative cardio ROS   Rhythm:Regular Rate:Normal     Neuro/Psych  Headaches, Depression    GI/Hepatic negative GI ROS, Neg liver ROS,   Endo/Other  Morbid obesity  Renal/GU negative Renal ROS  negative genitourinary   Musculoskeletal  (+) Arthritis , Osteoarthritis,    Abdominal   Peds  Hematology negative hematology ROS (+)   Anesthesia Other Findings   Reproductive/Obstetrics negative OB ROS                            Anesthesia Physical Anesthesia Plan  ASA: III  Anesthesia Plan: Spinal   Post-op Pain Management:    Induction: Intravenous  PONV Risk Score and Plan: 3 and Ondansetron, Dexamethasone, Midazolam and Propofol infusion  Airway Management Planned: Simple Face Mask  Additional Equipment:   Intra-op Plan:   Post-operative Plan: Extubation in OR  Informed Consent: I have reviewed the patients History and Physical, chart, labs and discussed the procedure including the risks, benefits and alternatives for the proposed anesthesia with the patient or authorized representative who has indicated his/her understanding and acceptance.     Dental advisory given  Plan Discussed with: CRNA  Anesthesia Plan Comments:         Anesthesia Quick Evaluation

## 2019-06-13 DIAGNOSIS — M1612 Unilateral primary osteoarthritis, left hip: Secondary | ICD-10-CM | POA: Diagnosis not present

## 2019-06-13 LAB — CBC
HCT: 29.7 % — ABNORMAL LOW (ref 36.0–46.0)
Hemoglobin: 9.4 g/dL — ABNORMAL LOW (ref 12.0–15.0)
MCH: 30.5 pg (ref 26.0–34.0)
MCHC: 31.6 g/dL (ref 30.0–36.0)
MCV: 96.4 fL (ref 80.0–100.0)
Platelets: 214 10*3/uL (ref 150–400)
RBC: 3.08 MIL/uL — ABNORMAL LOW (ref 3.87–5.11)
RDW: 12.8 % (ref 11.5–15.5)
WBC: 15.8 10*3/uL — ABNORMAL HIGH (ref 4.0–10.5)
nRBC: 0 % (ref 0.0–0.2)

## 2019-06-13 NOTE — Progress Notes (Signed)
Physical Therapy Treatment Patient Details Name: Rhonda Simmons MRN: [id number] DOB: 06-Aug-1961 Today's Date: 06/13/2019    History of Present Illness s/p L THA. PMHx: hearing impairment,  cochlear implants, obesity    PT Comments    Pt tolerates 2nd session today slightly better, however still have great difficulty and pain in LLE groin area and down lateral leg at times. Describes more like a nerve pain, but unsure, defintely limited with hip flexion and muscle activity on LLE. Recommend staying another night due to unable to move safely at this time. Will continue to work with pt for increased ability with mobility.     Follow Up Recommendations  Follow surgeon's recommendation for DC plan and follow-up therapies(pt states HHPT then OPPT per surgeon conversation)     Equipment Recommendations  None recommended by PT    Recommendations for Other Services       Precautions / Restrictions Restrictions Weight Bearing Restrictions: No    Mobility  Bed Mobility Overal bed mobility: Needs Assistance Bed Mobility: Supine to Sit;Sit to Supine     Supine to sit: Min assist Sit to supine: Mod assist   General bed mobility comments: assist with LLE,incr time. Pt with needed assistance with LLE for sit to supine . Tried sidelyine in hookying to decrease abdominal pressure due to central large abdominal hernia , and to assist with L kip flexor pain. Still very challenging for bed mobility.  Transfers Overall transfer level: Needs assistance Equipment used: Rolling walker (2 wheeled) Transfers: Sit to/from Stand Sit to Stand: Min assist         General transfer comment: cues for safety with RW  Ambulation/Gait Ambulation/Gait assistance: Min guard;Min assist Gait Distance (Feet): 45 Feet Assistive device: Rolling walker (2 wheeled) Gait Pattern/deviations: Step-to pattern     General Gait Details: pt with inability to have foot clearnce during LLE swing phase each time.  Pt using "toe crawl " to progress LLE at times. Pt states it is painful and "feel heavy". shooting pain in L groin and lateral thigh area with some weight bearing and hip flexion during walking.   Stairs             Wheelchair Mobility    Modified Rankin (Stroke Patients Only)       Balance                                            Cognition Arousal/Alertness: Awake/alert Behavior During Therapy: WFL for tasks assessed/performed Overall Cognitive Status: Within Functional Limits for tasks assessed                                        Exercises Total Joint Exercises Ankle Circles/Pumps: AROM;20 reps;Both;Supine Quad Sets: AROM;20 reps;Both;Supine Heel Slides: 15 reps;AAROM;Supine Hip ABduction/ADduction: AAROM;Supine;10 reps    General Comments        Pertinent Vitals/Pain Pain Assessment: 0-10 Pain Score: 7  Pain Location: pt tolerated 2nd session slightly better. Still with shooting pain in L groin area and lateral thigh as well, especially with movement . Pain Descriptors / Indicators: Burning;Aching Pain Intervention(s): Monitored during session;Ice applied    Home Living                      Prior  Function            PT Goals (current goals can now be found in the care plan section)      Frequency    7X/week      PT Plan Discharge plan needs to be updated(potentially may need one more day due to decreased ability to mobilize today with increased pain.)    Co-evaluation              AM-PAC PT "6 Clicks" Mobility   Outcome Measure  Help needed turning from your back to your side while in a flat bed without using bedrails?: A Little Help needed moving from lying on your back to sitting on the side of a flat bed without using bedrails?: A Little Help needed moving to and from a bed to a chair (including a wheelchair)?: A Little Help needed standing up from a chair using your arms (e.g.,  wheelchair or bedside chair)?: A Little Help needed to walk in hospital room?: A Little Help needed climbing 3-5 steps with a railing? : A Lot 6 Click Score: 17    End of Session   Activity Tolerance: Patient limited by pain Patient left: in bed Nurse Communication: Mobility status PT Visit Diagnosis: Difficulty in walking, not elsewhere classified (R26.2)     Time: 1330-1405 PT Time Calculation (min) (ACUTE ONLY): 35 min  Charges:  $Gait Training: 8-22 mins $Therapeutic Exercise: 8-22 mins                     Clide Dales, PT Acute Rehabilitation Services Pager: [phone] Office: [phone] 06/13/2019    Clide Dales 06/13/2019, 7:47 PM

## 2019-06-13 NOTE — Progress Notes (Signed)
Subjective: 1 Day Post-Op Procedure(s) (LRB): TOTAL HIP ARTHROPLASTY ANTERIOR APPROACH (Left)   Patient feels well and is looking forward to going home.  Activity level:  wbat Diet tolerance:  ok Voiding:  ok Patient reports pain as mild.    Objective: Vital signs in last 24 hours: Temp:  [97.6 F (36.4 C)-98.1 F (36.7 C)] 97.9 F (36.6 C) (10/10 0839) Pulse Rate:  [82-98] 92 (10/10 0839) Resp:  [13-23] 18 (10/10 0839) BP: (114-141)/(53-89) 127/71 (10/10 0839) SpO2:  [94 %-100 %] 96 % (10/10 0839)  Labs: Recent Labs    06/12/19 1325 06/13/19 0341  HGB 10.5* 9.4*   Recent Labs    06/12/19 1325 06/13/19 0341  WBC 15.5* 15.8*  RBC 3.39* 3.08*  HCT 32.2* 29.7*  PLT 245 214   No results for input(s): NA, K, CL, CO2, BUN, CREATININE, GLUCOSE, CALCIUM in the last 72 hours. No results for input(s): LABPT, INR in the last 72 hours.  Physical Exam:  Neurologically intact ABD soft Neurovascular intact Sensation intact distally Intact pulses distally Dorsiflexion/Plantar flexion intact Incision: dressing C/D/I and no drainage No cellulitis present Compartment soft  Assessment/Plan:  1 Day Post-Op Procedure(s) (LRB): TOTAL HIP ARTHROPLASTY ANTERIOR APPROACH (Left) Advance diet Up with therapy D/C IV fluids Discharge home with home health today if cleared by PT. Continue on asa BID for DVT prevention. Follow up with Dr. Berenice Primas as scheduled in 2 weeks.    Rhonda Simmons 06/13/2019, 8:41 AM

## 2019-06-13 NOTE — Plan of Care (Signed)
Pt stable no needs. Pt hoping to d/c home today and will need home equipment prior to d/c.

## 2019-06-13 NOTE — Progress Notes (Signed)
Rhonda Simmons notified that rn and physical therapy concern that pt be d/c'ed today. He was also notified of continued groin spasms as well. He stated he would round on these patients tomorrow.

## 2019-06-13 NOTE — TOC Transition Note (Signed)
Transition of Care [facility]) - CM/SW Discharge Note   Patient Details  Name: Rhonda Simmons MRN: [id number] Date of Birth: 1961-04-20  Transition of Care [facility]) CM/SW Contact:  Dessa Phi, RN Phone Number: 06/13/2019, 11:01 AM   Clinical Narrative:  D/c home w/rw ordered-[employer] Sol to deliver to rm prior d/c. No further CM needs.           Patient Goals and CMS Choice        Discharge Placement                       Discharge Plan and Services                                     Social Determinants of Health (SDOH) Interventions     Readmission Risk Interventions No flowsheet data found.

## 2019-06-13 NOTE — Discharge Summary (Addendum)
Patient ID: Rhonda Simmons MRN: [id number] DOB/AGE: 57/04/1961 57 y.o.  Admit date: 06/12/2019 Discharge date: 06/15/2019  Admission Diagnoses:  Principal Problem:   Primary osteoarthritis of left hip   Discharge Diagnoses:  Same  Past Medical History:  Diagnosis Date  . Asthma    pt denies  . Chronic bronchitis    Dr. Maple HudsonYoung  . Complication of anesthesia    pt stated woke up too soon, crying  . Depression   . Diverticulosis    pt unaware  . Fatty liver   . Hearing impaired   . 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 History of carpal tunnel syndrome   . Hyperlipidemia   . Migraines   . Neuropathy    pt unaware  . OA (osteoarthritis)   . Obesity   . OSA (obstructive sleep apnea)   . Plantar fasciitis, right   . Umbilical hernia     Surgeries: Procedure(s):Left TOTAL HIP ARTHROPLASTY ANTERIOR APPROACH on 06/12/2019   Consultants:   Discharged Condition: Improved  Hospital Course: Rhonda Simmons is an 58 y.o. female who was admitted 06/12/2019 for operative treatment ofPrimary osteoarthritis of left hip. Patient has severe unremitting pain that affects sleep, daily activities, and work/hobbies. After pre-op clearance the patient was taken to the operating room on 06/12/2019 and underwent  Procedure(s):Left TOTAL HIP ARTHROPLASTY ANTERIOR APPROACH.    Patient was given perioperative antibiotics:  Anti-infectives (From admission, onward)   Start     Dose/Rate Route Frequency Ordered Stop   06/12/19 1700  ceFAZolin (ANCEF) IVPB 2g/100 mL premix     2 g 200 mL/hr over 30 Minutes Intravenous Every 6 hours 06/12/19 1454 06/12/19 2320   06/12/19 0800  ceFAZolin (ANCEF) IVPB 2g/100 mL premix     2 g 200 mL/hr over 30 Minutes Intravenous On call to O.R. 06/12/19 0747 06/12/19 1109       Patient was given sequential compression devices, early ambulation, and chemoprophylaxis to prevent DVT.The patient made slow progress with PT.  Patient benefited maximally from  hospital stay and there were no complications.    Recent vital signs:  Patient Vitals for the past 24 hrs:  BP Temp Temp src Pulse Resp SpO2  06/13/19 0839 127/71 97.9 F (36.6 C) Oral 92 18 96 %  06/13/19 0513 (!) 141/80 97.9 F (36.6 C) Oral 93 18 96 %  06/13/19 0109 133/71 97.9 F (36.6 C) Oral 97 19 97 %  06/12/19 2042 125/73 98.1 F (36.7 C) Oral 92 18 100 %  06/12/19 1756 114/70 97.8 F (36.6 C) Oral 97 14 97 %  06/12/19 1605 120/80 97.7 F (36.5 C) Oral 89 15 96 %  06/12/19 1500 130/70 98 F (36.7 C) Oral 94 15 100 %  06/12/19 1444 127/70 98 F (36.7 C) - 98 14 94 %  06/12/19 1430 130/75 - - 88 16 100 %  06/12/19 1415 116/82 - - 88 13 98 %  06/12/19 1400 125/87 - - 93 17 100 %  06/12/19 1345 (!) 141/89 - - 93 16 100 %  06/12/19 1330 (!) 141/88 - - 95 20 100 %  06/12/19 1315 136/86 - - 91 (!) 23 100 %  06/12/19 1300 (!) 114/53 - - 87 14 100 %  06/12/19 1252 133/73 97.6 F (36.4 C) - 82 15 99 %     Recent laboratory studies:  Recent Labs    06/12/19 1325 06/13/19 0341  WBC 15.5* 15.8*  HGB 10.5*  9.4*  HCT 32.2* 29.7*  PLT 245 214     Discharge Medications:   Allergies as of 06/13/2019      Reactions   Compazine    convulsions   Fexofenadine    D version makes headaches worse   Midol [aspirin-cinnamedrine-caffeine] Swelling   Tongue and lip swelling   Naproxen Sodium Swelling   Tongue and lip swelling      Medication List    STOP taking these medications   cephALEXin 500 MG capsule Commonly known as: KEFLEX   fluconazole 150 MG tablet Commonly known as: DIFLUCAN   HYDROcodone-acetaminophen 10-325 MG tablet Commonly known as: NORCO   predniSONE 10 MG tablet Commonly known as: DELTASONE   topiramate 50 MG tablet Commonly known as: TOPAMAX     TAKE these medications   ALPRAZolam 0.25 MG tablet Commonly known as: XANAX Take 0.25 mg by mouth daily as needed for anxiety.   aspirin EC 325 MG tablet Take 1 tablet (325 mg total) by mouth 2  (two) times daily after a meal. Take x 1 month post op to decrease risk of blood clots.   Biotin 5000 MCG Tabs Take 5,000 mcg by mouth daily.   celecoxib 200 MG capsule Commonly known as: CELEBREX Take 200 mg by mouth daily.   cyclobenzaprine 10 MG tablet Commonly known as: FLEXERIL Take 10 mg by mouth 3 (three) times daily as needed for muscle spasms.   docusate sodium 100 MG capsule Commonly known as: Colace Take 1 capsule (100 mg total) by mouth 2 (two) times daily.   DULoxetine 60 MG capsule Commonly known as: CYMBALTA Take 60 mg by mouth daily.   EPINEPHrine 0.3 mg/0.3 mL Soaj injection Commonly known as: EpiPen 2-Pak Inject 0.3 mLs (0.3 mg total) into the muscle once as needed (for severe allergic reaction). CAll 911 immediately if you have to use this medicine   hydrOXYzine 25 MG tablet Commonly known as: ATARAX/VISTARIL Take 25 mg by mouth every 6 (six) hours as needed for itching.   liraglutide 18 MG/3ML Sopn Commonly known as: VICTOZA Inject 2 mg into the skin daily.   oxyCODONE-acetaminophen 5-325 MG tablet Commonly known as: PERCOCET/ROXICET Take 1-2 tablets by mouth every 6 (six) hours as needed for severe pain.   zolpidem 10 MG tablet Commonly known as: AMBIEN Take 10 mg by mouth at bedtime as needed for sleep.       Diagnostic Studies: Dg Chest 2 View  Result Date: 06/05/2019 CLINICAL DATA:  Preoperative, hip replacement EXAM: CHEST - 2 VIEW COMPARISON:  07/23/2017 FINDINGS: Mild cardiomegaly. Both lungs are clear. Disc degenerative disease of the thoracic spine. IMPRESSION: No acute abnormality of the lungs. Electronically Signed   By: Lauralyn PrimesAlex  Bibbey M.D.   On: 06/05/2019 13:51   Dg C-arm 1-60 Min-no Report  Result Date: 06/12/2019 Fluoroscopy was utilized by the requesting physician.  No radiographic interpretation.   Dg Hip Operative Unilat W Or W/o Pelvis Left  Result Date: 06/12/2019 CLINICAL DATA:  Left-sided hip arthroplasty. EXAM: OPERATIVE  LEFT HIP (WITH PELVIS IF PERFORMED) intraoperative VIEWS TECHNIQUE: Fluoroscopic spot image(s) were submitted for interpretation post-operatively. COMPARISON:  CT of the left hip March 27, 2018 FINDINGS: Intraoperative fluoroscopic images from total left hip arthroplasty demonstrate normal alignment of the prosthetic components. No evidence of fracture. Fluoroscopy time is recorded as 40 seconds. IMPRESSION: Intraoperative fluoroscopic images from total left hip arthroplasty. Electronically Signed   By: Ted Mcalpineobrinka  Dimitrova M.D.   On: 06/12/2019 14:24    Disposition:  Discharge disposition: 01-Home or Self Care       Discharge Instructions    Call MD / Call 911   Complete by: As directed    If you experience chest pain or shortness of breath, CALL 911 and be transported to the hospital emergency room.  If you develope a fever above 101 F, pus (white drainage) or increased drainage or redness at the wound, or calf pain, call your surgeon's office.   Constipation Prevention   Complete by: As directed    Drink plenty of fluids.  Prune juice may be helpful.  You may use a stool softener, such as Colace (over the counter) 100 mg twice a day.  Use MiraLax (over the counter) for constipation as needed.   Diet - low sodium heart healthy   Complete by: As directed    Discharge instructions   Complete by: As directed    INSTRUCTIONS AFTER JOINT REPLACEMENT   Remove items at home which could result in a fall. This includes throw rugs or furniture in walking pathways ICE to the affected joint every three hours while awake for 30 minutes at a time, for at least the first 3-5 days, and then as needed for pain and swelling.  Continue to use ice for pain and swelling. You may notice swelling that will progress down to the foot and ankle.  This is normal after surgery.  Elevate your leg when you are not up walking on it.   Continue to use the breathing machine you got in the hospital (incentive spirometer)  which will help keep your temperature down.  It is common for your temperature to cycle up and down following surgery, especially at night when you are not up moving around and exerting yourself.  The breathing machine keeps your lungs expanded and your temperature down.   DIET:  As you were doing prior to hospitalization, we recommend a well-balanced diet.  DRESSING / WOUND CARE / SHOWERING  You may shower 3 days after surgery, but keep the wounds dry during showering.  You may use an occlusive plastic wrap (Press'n Seal for example), NO SOAKING/SUBMERGING IN THE BATHTUB.  If the bandage gets wet, change with a clean dry gauze.  If the incision gets wet, pat the wound dry with a clean towel.  ACTIVITY  Increase activity slowly as tolerated, but follow the weight bearing instructions below.   No driving for 6 weeks or until further direction given by your physician.  You cannot drive while taking narcotics.  No lifting or carrying greater than 10 lbs. until further directed by your surgeon. Avoid periods of inactivity such as sitting longer than an hour when not asleep. This helps prevent blood clots.  You may return to work once you are authorized by your doctor.     WEIGHT BEARING   Weight bearing as tolerated with assist device (walker, cane, etc) as directed, use it as long as suggested by your surgeon or therapist, typically at least 4-6 weeks.   EXERCISES  Results after joint replacement surgery are often greatly improved when you follow the exercise, range of motion and muscle strengthening exercises prescribed by your doctor. Safety measures are also important to protect the joint from further injury. Any time any of these exercises cause you to have increased pain or swelling, decrease what you are doing until you are comfortable again and then slowly increase them. If you have problems or questions, call your caregiver or physical therapist for advice.  Rehabilitation is  important following a joint replacement. After just a few days of immobilization, the muscles of the leg can become weakened and shrink (atrophy).  These exercises are designed to build up the tone and strength of the thigh and leg muscles and to improve motion. Often times heat used for twenty to thirty minutes before working out will loosen up your tissues and help with improving the range of motion but do not use heat for the first two weeks following surgery (sometimes heat can increase post-operative swelling).   These exercises can be done on a training (exercise) mat, on the floor, on a table or on a bed. Use whatever works the best and is most comfortable for you.    Use music or television while you are exercising so that the exercises are a pleasant break in your day. This will make your life better with the exercises acting as a break in your routine that you can look forward to.   Perform all exercises about fifteen times, three times per day or as directed.  You should exercise both the operative leg and the other leg as well.   Exercises include:   Quad Sets - Tighten up the muscle on the front of the thigh (Quad) and hold for 5-10 seconds.   Straight Leg Raises - With your knee straight (if you were given a brace, keep it on), lift the leg to 60 degrees, hold for 3 seconds, and slowly lower the leg.  Perform this exercise against resistance later as your leg gets stronger.  Leg Slides: Lying on your back, slowly slide your foot toward your buttocks, bending your knee up off the floor (only go as far as is comfortable). Then slowly slide your foot back down until your leg is flat on the floor again.  Angel Wings: Lying on your back spread your legs to the side as far apart as you can without causing discomfort.  Hamstring Strength:  Lying on your back, push your heel against the floor with your leg straight by tightening up the muscles of your buttocks.  Repeat, but this time bend your knee  to a comfortable angle, and push your heel against the floor.  You may put a pillow under the heel to make it more comfortable if necessary.   A rehabilitation program following joint replacement surgery can speed recovery and prevent re-injury in the future due to weakened muscles. Contact your doctor or a physical therapist for more information on knee rehabilitation.    CONSTIPATION  Constipation is defined medically as fewer than three stools per week and severe constipation as less than one stool per week.  Even if you have a regular bowel pattern at home, your normal regimen is likely to be disrupted due to multiple reasons following surgery.  Combination of anesthesia, postoperative narcotics, change in appetite and fluid intake all can affect your bowels.   YOU MUST use at least one of the following options; they are listed in order of increasing strength to get the job done.  They are all available over the counter, and you may need to use some, POSSIBLY even all of these options:    Drink plenty of fluids (prune juice may be helpful) and high fiber foods Colace 100 mg by mouth twice a day  Senokot for constipation as directed and as needed Dulcolax (bisacodyl), take with full glass of water  Miralax (polyethylene glycol) once or twice a day as needed.  If you  have tried all these things and are unable to have a bowel movement in the first 3-4 days after surgery call either your surgeon or your primary doctor.    If you experience loose stools or diarrhea, hold the medications until you stool forms back up.  If your symptoms do not get better within 1 week or if they get worse, check with your doctor.  If you experience "the worst abdominal pain ever" or develop nausea or vomiting, please contact the office immediately for further recommendations for treatment.   ITCHING:  If you experience itching with your medications, try taking only a single pain pill, or even half a pain pill at a  time.  You can also use Benadryl over the counter for itching or also to help with sleep.   TED HOSE STOCKINGS:  Use stockings on both legs until for at least 2 weeks or as directed by physician office. They may be removed at night for sleeping.  MEDICATIONS:  See your medication summary on the "After Visit Summary" that nursing will review with you.  You may have some home medications which will be placed on hold until you complete the course of blood thinner medication.  It is important for you to complete the blood thinner medication as prescribed.  PRECAUTIONS:  If you experience chest pain or shortness of breath - call 911 immediately for transfer to the hospital emergency department.   If you develop a fever greater that 101 F, purulent drainage from wound, increased redness or drainage from wound, foul odor from the wound/dressing, or calf pain - CONTACT YOUR SURGEON.                                                   FOLLOW-UP APPOINTMENTS:  If you do not already have a post-op appointment, please call the office for an appointment to be seen by your surgeon.  Guidelines for how soon to be seen are listed in your "After Visit Summary", but are typically between 1-4 weeks after surgery.  OTHER INSTRUCTIONS:   Knee Replacement:  Do not place pillow under knee, focus on keeping the knee straight while resting. CPM instructions: 0-90 degrees, 2 hours in the morning, 2 hours in the afternoon, and 2 hours in the evening. Place foam block, curve side up under heel at all times except when in CPM or when walking.  DO NOT modify, tear, cut, or change the foam block in any way.  MAKE SURE YOU:  Understand these instructions.  Get help right away if you are not doing well or get worse.    Thank you for letting us be a part of your medical care team.  It is a privilege we respect greatly.  We hope these instructions will help you stay on track for a fast and full recovery!   Increase activity slowly  as tolerated   Complete by: As directed       Follow-up Information    Dorna Leitz, MD. Schedule an appointment as soon as possible for a visit in 2 weeks.   Specialty: Orthopedic Surgery Contact information: [place] [address] [phone]            Signed: Larwance Sachs Nida 06/13/2019, 8:45 AM

## 2019-06-13 NOTE — Progress Notes (Signed)
Physical Therapy Treatment Patient Details Name: Rhonda Simmons MRN: [id number] DOB: Sep 15, 1960 Today's Date: 06/13/2019    History of Present Illness s/p L THA. PMHx: hearing impairment,  cochlear implants, obesity    PT Comments    Pt had just returned from attempting to ambulate with RW with CNA. Pt stated she was not able to that her LLE was not able to move like yesterday. Then she stated the pain was so severe it was very hard to get back in the bad after using the bedside commode. Pt just now trying to get comfortable with pain. States it is sharpe in groin area and down middle of thigh as well and inner groin area to her sacrum area.   Worked and educated her a lot in bed supine with gentle ROM exercises. Still wanted to hold on ambulation at this time ( wants to work hard, just pain is severe), so nurse was able to give her a muscle relaxer and tylenol and I will check back later after lunch.     Follow Up Recommendations  Follow surgeon's recommendation for DC plan and follow-up therapies(pt states HHPT then OPPT per surgeon conversation)     Equipment Recommendations  None recommended by PT    Recommendations for Other Services       Precautions / Restrictions Restrictions Weight Bearing Restrictions: No             Exercises Total Joint Exercises Ankle Circles/Pumps: AROM;20 reps;Both;Supine Quad Sets: AROM;20 reps;Both;Supine Heel Slides: 15 reps;AAROM;Supine Hip ABduction/ADduction: AAROM;Supine;10 reps    General Comments        Pertinent Vitals/Pain Pain Assessment: 0-10 Pain Score: 8  Pain Location: Left hip groin area and "through to my tailbone" THis was after trying to ambulate tot the bathroom with the tech and was unable to due to so much pain and her L LE did not move as well per pt Pain Descriptors / Indicators: Aching;Burning Pain Intervention(s): Monitored during session;Ice applied    Home Living                      Prior  Function            PT Goals (current goals can now be found in the care plan section) Acute Rehab PT Goals Patient Stated Goal: less hip pain    Frequency    7X/week      PT Plan Discharge plan needs to be updated(potentially may need one more day due to decreased ability to mobilize today with increased pain.)    Co-evaluation              AM-PAC PT "6 Clicks" Mobility   Outcome Measure  Help needed turning from your back to your side while in a flat bed without using bedrails?: A Little Help needed moving from lying on your back to sitting on the side of a flat bed without using bedrails?: A Little Help needed moving to and from a bed to a chair (including a wheelchair)?: A Little Help needed standing up from a chair using your arms (e.g., wheelchair or bedside chair)?: A Little Help needed to walk in hospital room?: A Little Help needed climbing 3-5 steps with a railing? : A Lot 6 Click Score: 17    End of Session   Activity Tolerance: Patient limited by pain Patient left: in bed Nurse Communication: Mobility status PT Visit Diagnosis: Difficulty in walking, not elsewhere classified (R26.2)  Time: 1045-1110 PT Time Calculation (min) (ACUTE ONLY): 25 min  Charges:  $Therapeutic Exercise: 23-37 mins                     Clide Dales, PT Acute Rehabilitation Services Pager: [phone] Office: [phone] 06/13/2019    Clide Dales 06/13/2019, 12:38 PM

## 2019-06-14 DIAGNOSIS — J45909 Unspecified asthma, uncomplicated: Secondary | ICD-10-CM | POA: Diagnosis present

## 2019-06-14 DIAGNOSIS — Z820 Family history of epilepsy and other diseases of the nervous system: Secondary | ICD-10-CM | POA: Diagnosis not present

## 2019-06-14 DIAGNOSIS — F329 Major depressive disorder, single episode, unspecified: Secondary | ICD-10-CM | POA: Diagnosis present

## 2019-06-14 DIAGNOSIS — Z886 Allergy status to analgesic agent status: Secondary | ICD-10-CM | POA: Diagnosis not present

## 2019-06-14 DIAGNOSIS — Z888 Allergy status to other drugs, medicaments and biological substances status: Secondary | ICD-10-CM | POA: Diagnosis not present

## 2019-06-14 DIAGNOSIS — Z96649 Presence of unspecified artificial hip joint: Secondary | ICD-10-CM

## 2019-06-14 DIAGNOSIS — Z20828 Contact with and (suspected) exposure to other viral communicable diseases: Secondary | ICD-10-CM | POA: Diagnosis present

## 2019-06-14 DIAGNOSIS — Z79899 Other long term (current) drug therapy: Secondary | ICD-10-CM | POA: Diagnosis not present

## 2019-06-14 DIAGNOSIS — E785 Hyperlipidemia, unspecified: Secondary | ICD-10-CM | POA: Diagnosis present

## 2019-06-14 DIAGNOSIS — Z96642 Presence of left artificial hip joint: Secondary | ICD-10-CM | POA: Diagnosis not present

## 2019-06-14 DIAGNOSIS — Z9621 Cochlear implant status: Secondary | ICD-10-CM | POA: Diagnosis present

## 2019-06-14 DIAGNOSIS — M1612 Unilateral primary osteoarthritis, left hip: Secondary | ICD-10-CM | POA: Diagnosis present

## 2019-06-14 DIAGNOSIS — G4733 Obstructive sleep apnea (adult) (pediatric): Secondary | ICD-10-CM | POA: Diagnosis present

## 2019-06-14 DIAGNOSIS — H9193 Unspecified hearing loss, bilateral: Secondary | ICD-10-CM | POA: Diagnosis present

## 2019-06-14 LAB — CBC
HCT: 27.2 % — ABNORMAL LOW (ref 36.0–46.0)
Hemoglobin: 8.7 g/dL — ABNORMAL LOW (ref 12.0–15.0)
MCH: 30.5 pg (ref 26.0–34.0)
MCHC: 32 g/dL (ref 30.0–36.0)
MCV: 95.4 fL (ref 80.0–100.0)
Platelets: 210 10*3/uL (ref 150–400)
RBC: 2.85 MIL/uL — ABNORMAL LOW (ref 3.87–5.11)
RDW: 13.2 % (ref 11.5–15.5)
WBC: 18.3 10*3/uL — ABNORMAL HIGH (ref 4.0–10.5)
nRBC: 0 % (ref 0.0–0.2)

## 2019-06-14 NOTE — Progress Notes (Signed)
Subjective: 2 Days Post-Op Procedure(s) (LRB): TOTAL HIP ARTHROPLASTY ANTERIOR APPROACH (Left)   Patient is nervous about going home and not being able to move as well as she would like. She states she will try hard to work hard with PT and see how it goes.  Activity level:  wbat Diet tolerance:  ok Voiding:  ok Patient reports pain as mild.    Objective: Vital signs in last 24 hours: Temp:  [97.9 F (36.6 C)-98.5 F (36.9 C)] 97.9 F (36.6 C) (10/11 0511) Pulse Rate:  [96-110] 96 (10/11 0511) Resp:  [18-20] 20 (10/11 0511) BP: (129-156)/(70-81) 156/81 (10/11 0511) SpO2:  [96 %-100 %] 99 % (10/11 0511)  Labs: Recent Labs    06/12/19 1325 06/13/19 0341 06/14/19 0315  HGB 10.5* 9.4* 8.7*   Recent Labs    06/13/19 0341 06/14/19 0315  WBC 15.8* 18.3*  RBC 3.08* 2.85*  HCT 29.7* 27.2*  PLT 214 210   No results for input(s): NA, K, CL, CO2, BUN, CREATININE, GLUCOSE, CALCIUM in the last 72 hours. No results for input(s): LABPT, INR in the last 72 hours.  Physical Exam:  Neurologically intact ABD soft Neurovascular intact Sensation intact distally Intact pulses distally Dorsiflexion/Plantar flexion intact Incision: dressing C/D/I and no drainage No cellulitis present Compartment soft  Assessment/Plan:  2 Days Post-Op Procedure(s) (LRB): TOTAL HIP ARTHROPLASTY ANTERIOR APPROACH (Left) Advance diet Up with therapy Discharge home with home health today after PT if doing well and cleared by PT.  Continue on ASA BID for dvt prevention Follow up with Dr. Berenice Primas as scheduled.    Larwance Sachs Marnell Mcdaniel 06/14/2019, 10:05 AM

## 2019-06-14 NOTE — Progress Notes (Signed)
Physical Therapy Treatment Patient Details Name: Rhonda Simmons MRN: [id number] DOB: 12-Oct-1960 Today's Date: 06/14/2019    History of Present Illness s/p L THA. PMHx: hearing impairment,  cochlear implants, obesity    PT Comments    Pt progressing well today but still requiring assist with transfers,  Bed mobility. incr gait distance and activity tolerance; however pt has minimal home support; will continue to follow; will need to practice stairs again (likely with cane and rail tomorrow). Will see again in pm to review HEP. Not ready for d/c today  Follow Up Recommendations  Follow surgeon's recommendation for DC plan and follow-up therapies(HHPT)     Equipment Recommendations  None recommended by PT    Recommendations for Other Services       Precautions / Restrictions Precautions Precautions: Fall Restrictions Weight Bearing Restrictions: No Other Position/Activity Restrictions: WBAT    Mobility  Bed Mobility Overal bed mobility: Needs Assistance Bed Mobility: Sit to Supine       Sit to supine: Min assist   General bed mobility comments: cues to use gait belt as leg lifter, min assist to scoot laterally  Transfers Overall transfer level: Needs assistance Equipment used: Rolling walker (2 wheeled) Transfers: Sit to/from Stand Sit to Stand: Min guard;Supervision         General transfer comment: cues for hand placement   Ambulation/Gait Ambulation/Gait assistance: Min Emergency planning/management officer (Feet): 90 Feet Assistive device: Rolling walker (2 wheeled) Gait Pattern/deviations: Step-to pattern     General Gait Details: cues for sequence, improved ability to advance LLE   Stairs Stairs: Yes Stairs assistance: Min assist;Min guard Stair Management: Sideways;Step to pattern;One rail Right;One rail Left Number of Stairs: 3 General stair comments: cues for sequence and technique, assist for balance    Wheelchair Mobility    Modified Rankin  (Stroke Patients Only)       Balance                                            Cognition Arousal/Alertness: Awake/alert Behavior During Therapy: WFL for tasks assessed/performed Overall Cognitive Status: Within Functional Limits for tasks assessed                                        Exercises      General Comments        Pertinent Vitals/Pain Pain Assessment: 0-10 Pain Score: 4  Pain Location: L hip Pain Descriptors / Indicators: Burning;Aching Pain Intervention(s): Limited activity within patient's tolerance;Monitored during session;Premedicated before session;Repositioned;Ice applied    Home Living                      Prior Function            PT Goals (current goals can now be found in the care plan section) Acute Rehab PT Goals Patient Stated Goal: less hip pain PT Goal Formulation: With patient Time For Goal Achievement: 06/19/19 Potential to Achieve Goals: Good Progress towards PT goals: Progressing toward goals    Frequency    7X/week      PT Plan Current plan remains appropriate    Co-evaluation              AM-PAC PT "6 Clicks" Mobility   Outcome Measure  Help needed turning from your back to your side while in a flat bed without using bedrails?: A Little Help needed moving from lying on your back to sitting on the side of a flat bed without using bedrails?: A Little Help needed moving to and from a bed to a chair (including a wheelchair)?: A Little Help needed standing up from a chair using your arms (e.g., wheelchair or bedside chair)?: A Little Help needed to walk in hospital room?: A Little Help needed climbing 3-5 steps with a railing? : A Little 6 Click Score: 18    End of Session Equipment Utilized During Treatment: Gait belt Activity Tolerance: Patient tolerated treatment well Patient left: in bed;with call bell/phone within reach;with bed alarm set Nurse Communication:  Mobility status PT Visit Diagnosis: Difficulty in walking, not elsewhere classified (R26.2)     Time: 4462-8638 PT Time Calculation (min) (ACUTE ONLY): 44 min  Charges:  $Gait Training: 23-37 mins $Therapeutic Activity: 8-22 mins                     Kenyon Ana, PT  Pager: [phone] Acute Rehab Dept [facility]): [phone]   06/14/2019    [facility] 06/14/2019, 12:27 PM

## 2019-06-14 NOTE — Plan of Care (Signed)
Plan of care reviewed and discussed with the patient and her husband. 

## 2019-06-14 NOTE — Progress Notes (Signed)
   06/14/19 1400  PT Visit Information  Last PT Received On 06/14/19  Exercise focused session. Pt tolerated well. Will see tomorrow for review of stairs/gait/safety. Pt should be ready for d/c tomorrow. Pt reports her ride will be available in the afternoon.   Assistance Needed +1  History of Present Illness s/p L THA. PMHx: hearing impairment,  cochlear implants, obesity  Subjective Data  Patient Stated Goal less hip pain  Precautions  Precautions Fall  Restrictions  Other Position/Activity Restrictions WBAT  Pain Assessment  Pain Assessment 0-10  Pain Score 3  Pain Location L hip  Pain Descriptors / Indicators Burning;Aching  Pain Intervention(s) Limited activity within patient's tolerance;Monitored during session;Premedicated before session;Repositioned  Cognition  Arousal/Alertness Awake/alert  Behavior During Therapy WFL for tasks assessed/performed  Overall Cognitive Status Within Functional Limits for tasks assessed  Total Joint Exercises  Ankle Circles/Pumps AROM;20 reps;Both;Supine  Quad Sets AROM;Both;10 reps  Heel Slides 15 reps;AAROM;Supine  Hip ABduction/ADduction AAROM;Supine;10 reps  Short Arc Quad AROM;Left;10 reps  PT - End of Session  Activity Tolerance Patient tolerated treatment well  Patient left with call bell/phone within reach;in bed;with bed alarm set  Nurse Communication Mobility status   PT - Assessment/Plan  PT Plan Current plan remains appropriate  PT Visit Diagnosis Difficulty in walking, not elsewhere classified (R26.2)  PT Frequency (ACUTE ONLY) 7X/week  Follow Up Recommendations Follow surgeon's recommendation for DC plan and follow-up therapies  PT equipment None recommended by PT  AM-PAC PT "6 Clicks" Mobility Outcome Measure (Version 2)  Help needed turning from your back to your side while in a flat bed without using bedrails? 3  Help needed moving from lying on your back to sitting on the side of a flat bed without using bedrails? 3   Help needed moving to and from a bed to a chair (including a wheelchair)? 3  Help needed standing up from a chair using your arms (e.g., wheelchair or bedside chair)? 3  Help needed to walk in hospital room? 3  Help needed climbing 3-5 steps with a railing?  3  6 Click Score 18  Consider Recommendation of Discharge To: Home with [facility]  PT Goal Progression  Progress towards PT goals Progressing toward goals  Acute Rehab PT Goals  PT Goal Formulation With patient  Time For Goal Achievement 06/19/19  Potential to Achieve Goals Good  PT Time Calculation  PT Start Time (ACUTE ONLY) 1346  PT Stop Time (ACUTE ONLY) 1405  PT Time Calculation (min) (ACUTE ONLY) 19 min  PT General Charges  $$ ACUTE PT VISIT 1 Visit  PT Treatments  $Therapeutic Exercise 8-22 mins

## 2019-06-15 ENCOUNTER — Encounter (HOSPITAL_COMMUNITY): Payer: Self-pay | Admitting: Orthopedic Surgery

## 2019-06-15 LAB — CBC
HCT: 26.2 % — ABNORMAL LOW (ref 36.0–46.0)
Hemoglobin: 8.6 g/dL — ABNORMAL LOW (ref 12.0–15.0)
MCH: 31.5 pg (ref 26.0–34.0)
MCHC: 32.8 g/dL (ref 30.0–36.0)
MCV: 96 fL (ref 80.0–100.0)
Platelets: 230 10*3/uL (ref 150–400)
RBC: 2.73 MIL/uL — ABNORMAL LOW (ref 3.87–5.11)
RDW: 13.5 % (ref 11.5–15.5)
WBC: 20.5 10*3/uL — ABNORMAL HIGH (ref 4.0–10.5)
nRBC: 0.2 % (ref 0.0–0.2)

## 2019-06-15 NOTE — Plan of Care (Signed)
Reviewed.  Progression goals adequate for D/C.

## 2019-06-15 NOTE — Progress Notes (Signed)
Subjective: 3 Days Post-Op Procedure(s) (LRB): TOTAL HIP ARTHROPLASTY ANTERIOR APPROACH (Left) Patient reports pain as moderate.  The patient denies dizziness, shortness of breath, or lightheadedness.  She has made slow progress with physical therapy, but is getting there.  She is taking by mouth and voiding okay.  Should be ready to go home today.  Objective: Vital signs in last 24 hours: Temp:  [98 F (36.7 C)-98.4 F (36.9 C)] 98 F (36.7 C) (10/12 0445) Pulse Rate:  [88-106] 88 (10/12 0445) Resp:  [18] 18 (10/12 0445) BP: (138-155)/(72-98) 148/98 (10/12 0445) SpO2:  [96 %-98 %] 96 % (10/12 0445)  Intake/Output from previous day: 10/11 0701 - 10/12 0700 In: 1560 [P.O.:1560] Out: 800 [Urine:800] Intake/Output this shift: No intake/output data recorded.  Recent Labs    06/12/19 1325 06/13/19 0341 06/14/19 0315 06/15/19 0329  HGB 10.5* 9.4* 8.7* 8.6*   Recent Labs    06/14/19 0315 06/15/19 0329  WBC 18.3* 20.5*  RBC 2.85* 2.73*  HCT 27.2* 26.2*  PLT 210 230   No results for input(s): NA, K, CL, CO2, BUN, CREATININE, GLUCOSE, CALCIUM in the last 72 hours. No results for input(s): LABPT, INR in the last 72 hours. Left hip exam: She has some mild drainage under her dressing.  This is normal-appearing. Sensation intact distally Intact pulses distally Dorsiflexion/Plantar flexion intact Incision: scant drainage and moderate drainage Compartment soft   Assessment/Plan: 3 Days Post-Op Procedure(s) (LRB): TOTAL HIP ARTHROPLASTY ANTERIOR APPROACH (Left) Acute blood loss anemia, stable, asymptomatic. Plan: Weight-bear as tolerated on the left without hip precautions. We will have nursing change her left hip dressing today. Aspirin 325 mg twice daily for DVT prophylaxis x1 month postop. Up with therapy Discharge home with home health  Follow-up with Dr. Berenice Primas in 2 weeks.   Erlene Senters 06/15/2019, 8:20 AM

## 2019-06-15 NOTE — TOC Progression Note (Addendum)
Transition of Care [facility]) - Progression Note    Patient Details  Name: Rhonda Simmons MRN: [id number] Date of Birth: 1961/02/09  Transition of Care [facility]) CM/SW [place], LCSW Phone Number: 06/15/2019, 10:49 AM  Clinical Narrative:    Patient unable to borrow 3 in 1 as expected. 3 IN 1 ordered through [place]. DME will be delivered to the patient room.      Barriers to Discharge: No Barriers Identified  Expected Discharge Plan and Services           Expected Discharge Date: 06/15/19               DME Arranged: Gilford Rile rolling DME Agency: [employer] Date DME Agency Contacted: 06/13/19 Time DME Agency Contacted: 69 Representative spoke with at DME Agency: [place]             Social Determinants of Health ([place]) Interventions    Readmission Risk Interventions No flowsheet data found.

## 2019-06-15 NOTE — Progress Notes (Signed)
Physical Therapy Treatment Patient Details Name: Rhonda Simmons MRN: [id number] DOB: 1960-12-08 Today's Date: 06/15/2019    History of Present Illness s/p L THA. PMHx: hearing impairment,  cochlear implants, obesity    PT Comments    Pt is progressing well, much more confident with gait/stairs/transfers. Reviewed stairs again with pt and husband.  Pt is ready to d/c home from PT standpoint  Follow Up Recommendations  Follow surgeon's recommendation for DC plan and follow-up therapies     Equipment Recommendations  None recommended by PT    Recommendations for Other Services       Precautions / Restrictions Precautions Precautions: Fall Restrictions Weight Bearing Restrictions: No Other Position/Activity Restrictions: WBAT    Mobility  Bed Mobility Overal bed mobility: Modified Independent Bed Mobility: Supine to Sit     Supine to sit: Modified independent (Device/Increase time)     General bed mobility comments: using gait belt as leg lifter  Transfers Overall transfer level: Needs assistance Equipment used: Rolling walker (2 wheeled) Transfers: Sit to/from Stand Sit to Stand: Supervision;Modified independent (Device/Increase time)         General transfer comment: pt self cues for hand placement   Ambulation/Gait Ambulation/Gait assistance: Supervision;Modified independent (Device/Increase time) Gait Distance (Feet): 120 Feet Assistive device: Rolling walker (2 wheeled) Gait Pattern/deviations: Step-to pattern;Step-through pattern     General Gait Details: improved fluidity of gait,  improved wt shift to LLE, incr step length   Stairs Stairs: Yes Stairs assistance: Min guard Stair Management: Sideways;Step to pattern;One rail Right;One rail Left;With cane Number of Stairs: 3(x2) General stair comments: cues for sequence and technique, reviewed with pt and husband, handout given   Wheelchair Mobility    Modified Rankin (Stroke Patients Only)       Balance                                            Cognition Arousal/Alertness: Awake/alert Behavior During Therapy: WFL for tasks assessed/performed Overall Cognitive Status: Within Functional Limits for tasks assessed                                        Exercises      General Comments        Pertinent Vitals/Pain Pain Assessment: 0-10 Pain Score: 3  Pain Location: L hip Pain Descriptors / Indicators: Tightness Pain Intervention(s): Limited activity within patient's tolerance;Monitored during session    Home Living                      Prior Function            PT Goals (current goals can now be found in the care plan section) Acute Rehab PT Goals Patient Stated Goal: less hip pain PT Goal Formulation: With patient Time For Goal Achievement: 06/19/19 Potential to Achieve Goals: Good Progress towards PT goals: Progressing toward goals    Frequency    7X/week      PT Plan Current plan remains appropriate    Co-evaluation              AM-PAC PT "6 Clicks" Mobility   Outcome Measure  Help needed turning from your back to your side while in a flat bed without using bedrails?: A Little Help needed moving  from lying on your back to sitting on the side of a flat bed without using bedrails?: A Little Help needed moving to and from a bed to a chair (including a wheelchair)?: A Little Help needed standing up from a chair using your arms (e.g., wheelchair or bedside chair)?: A Little Help needed to walk in hospital room?: A Little Help needed climbing 3-5 steps with a railing? : A Little 6 Click Score: 18    End of Session Equipment Utilized During Treatment: Gait belt Activity Tolerance: Patient tolerated treatment well Patient left: with call bell/phone within reach;with family/visitor present Nurse Communication: Mobility status PT Visit Diagnosis: Difficulty in walking, not elsewhere classified  (R26.2)     Time: 4098-1191 PT Time Calculation (min) (ACUTE ONLY): 36 min  Charges:  $Gait Training: 23-37 mins                     Drucilla Chalet, PT  Pager: [phone] Acute Rehab Dept [facility]): [phone]   06/15/2019    [facility] 06/15/2019, 12:54 PM

## 2019-06-17 DIAGNOSIS — H9193 Unspecified hearing loss, bilateral: Secondary | ICD-10-CM | POA: Diagnosis not present

## 2019-06-17 DIAGNOSIS — E785 Hyperlipidemia, unspecified: Secondary | ICD-10-CM | POA: Diagnosis not present

## 2019-06-17 DIAGNOSIS — Z471 Aftercare following joint replacement surgery: Secondary | ICD-10-CM | POA: Diagnosis not present

## 2019-06-17 DIAGNOSIS — K760 Fatty (change of) liver, not elsewhere classified: Secondary | ICD-10-CM | POA: Diagnosis not present

## 2019-06-17 DIAGNOSIS — Z96642 Presence of left artificial hip joint: Secondary | ICD-10-CM | POA: Diagnosis not present

## 2019-06-17 DIAGNOSIS — G43909 Migraine, unspecified, not intractable, without status migrainosus: Secondary | ICD-10-CM | POA: Diagnosis not present

## 2019-06-17 DIAGNOSIS — Z9621 Cochlear implant status: Secondary | ICD-10-CM | POA: Diagnosis not present

## 2019-06-17 DIAGNOSIS — K429 Umbilical hernia without obstruction or gangrene: Secondary | ICD-10-CM | POA: Diagnosis not present

## 2019-06-17 DIAGNOSIS — Z7982 Long term (current) use of aspirin: Secondary | ICD-10-CM | POA: Diagnosis not present

## 2019-06-17 DIAGNOSIS — Z6841 Body Mass Index (BMI) 40.0 and over, adult: Secondary | ICD-10-CM | POA: Diagnosis not present

## 2019-06-17 DIAGNOSIS — G4733 Obstructive sleep apnea (adult) (pediatric): Secondary | ICD-10-CM | POA: Diagnosis not present

## 2019-06-17 DIAGNOSIS — E669 Obesity, unspecified: Secondary | ICD-10-CM | POA: Diagnosis not present

## 2019-06-17 DIAGNOSIS — G5600 Carpal tunnel syndrome, unspecified upper limb: Secondary | ICD-10-CM | POA: Diagnosis not present

## 2019-06-17 DIAGNOSIS — G629 Polyneuropathy, unspecified: Secondary | ICD-10-CM | POA: Diagnosis not present

## 2019-06-17 DIAGNOSIS — K5790 Diverticulosis of intestine, part unspecified, without perforation or abscess without bleeding: Secondary | ICD-10-CM | POA: Diagnosis not present

## 2019-06-17 DIAGNOSIS — F329 Major depressive disorder, single episode, unspecified: Secondary | ICD-10-CM | POA: Diagnosis not present

## 2019-06-19 DIAGNOSIS — Z6841 Body Mass Index (BMI) 40.0 and over, adult: Secondary | ICD-10-CM | POA: Diagnosis not present

## 2019-06-19 DIAGNOSIS — K5790 Diverticulosis of intestine, part unspecified, without perforation or abscess without bleeding: Secondary | ICD-10-CM | POA: Diagnosis not present

## 2019-06-19 DIAGNOSIS — H9193 Unspecified hearing loss, bilateral: Secondary | ICD-10-CM | POA: Diagnosis not present

## 2019-06-19 DIAGNOSIS — Z96642 Presence of left artificial hip joint: Secondary | ICD-10-CM | POA: Diagnosis not present

## 2019-06-19 DIAGNOSIS — E785 Hyperlipidemia, unspecified: Secondary | ICD-10-CM | POA: Diagnosis not present

## 2019-06-19 DIAGNOSIS — G629 Polyneuropathy, unspecified: Secondary | ICD-10-CM | POA: Diagnosis not present

## 2019-06-19 DIAGNOSIS — E669 Obesity, unspecified: Secondary | ICD-10-CM | POA: Diagnosis not present

## 2019-06-19 DIAGNOSIS — G43909 Migraine, unspecified, not intractable, without status migrainosus: Secondary | ICD-10-CM | POA: Diagnosis not present

## 2019-06-19 DIAGNOSIS — G5600 Carpal tunnel syndrome, unspecified upper limb: Secondary | ICD-10-CM | POA: Diagnosis not present

## 2019-06-19 DIAGNOSIS — K429 Umbilical hernia without obstruction or gangrene: Secondary | ICD-10-CM | POA: Diagnosis not present

## 2019-06-19 DIAGNOSIS — F329 Major depressive disorder, single episode, unspecified: Secondary | ICD-10-CM | POA: Diagnosis not present

## 2019-06-19 DIAGNOSIS — Z9621 Cochlear implant status: Secondary | ICD-10-CM | POA: Diagnosis not present

## 2019-06-19 DIAGNOSIS — G4733 Obstructive sleep apnea (adult) (pediatric): Secondary | ICD-10-CM | POA: Diagnosis not present

## 2019-06-19 DIAGNOSIS — K760 Fatty (change of) liver, not elsewhere classified: Secondary | ICD-10-CM | POA: Diagnosis not present

## 2019-06-19 DIAGNOSIS — Z471 Aftercare following joint replacement surgery: Secondary | ICD-10-CM | POA: Diagnosis not present

## 2019-06-19 DIAGNOSIS — Z7982 Long term (current) use of aspirin: Secondary | ICD-10-CM | POA: Diagnosis not present

## 2019-06-22 DIAGNOSIS — K5790 Diverticulosis of intestine, part unspecified, without perforation or abscess without bleeding: Secondary | ICD-10-CM | POA: Diagnosis not present

## 2019-06-22 DIAGNOSIS — K760 Fatty (change of) liver, not elsewhere classified: Secondary | ICD-10-CM | POA: Diagnosis not present

## 2019-06-22 DIAGNOSIS — G5600 Carpal tunnel syndrome, unspecified upper limb: Secondary | ICD-10-CM | POA: Diagnosis not present

## 2019-06-22 DIAGNOSIS — E669 Obesity, unspecified: Secondary | ICD-10-CM | POA: Diagnosis not present

## 2019-06-22 DIAGNOSIS — Z471 Aftercare following joint replacement surgery: Secondary | ICD-10-CM | POA: Diagnosis not present

## 2019-06-22 DIAGNOSIS — G629 Polyneuropathy, unspecified: Secondary | ICD-10-CM | POA: Diagnosis not present

## 2019-06-22 DIAGNOSIS — Z7982 Long term (current) use of aspirin: Secondary | ICD-10-CM | POA: Diagnosis not present

## 2019-06-22 DIAGNOSIS — G43909 Migraine, unspecified, not intractable, without status migrainosus: Secondary | ICD-10-CM | POA: Diagnosis not present

## 2019-06-22 DIAGNOSIS — Z6841 Body Mass Index (BMI) 40.0 and over, adult: Secondary | ICD-10-CM | POA: Diagnosis not present

## 2019-06-22 DIAGNOSIS — Z9621 Cochlear implant status: Secondary | ICD-10-CM | POA: Diagnosis not present

## 2019-06-22 DIAGNOSIS — G4733 Obstructive sleep apnea (adult) (pediatric): Secondary | ICD-10-CM | POA: Diagnosis not present

## 2019-06-22 DIAGNOSIS — E785 Hyperlipidemia, unspecified: Secondary | ICD-10-CM | POA: Diagnosis not present

## 2019-06-22 DIAGNOSIS — H9193 Unspecified hearing loss, bilateral: Secondary | ICD-10-CM | POA: Diagnosis not present

## 2019-06-22 DIAGNOSIS — K429 Umbilical hernia without obstruction or gangrene: Secondary | ICD-10-CM | POA: Diagnosis not present

## 2019-06-22 DIAGNOSIS — Z96642 Presence of left artificial hip joint: Secondary | ICD-10-CM | POA: Diagnosis not present

## 2019-06-22 DIAGNOSIS — F329 Major depressive disorder, single episode, unspecified: Secondary | ICD-10-CM | POA: Diagnosis not present

## 2019-06-23 DIAGNOSIS — Z471 Aftercare following joint replacement surgery: Secondary | ICD-10-CM | POA: Diagnosis not present

## 2019-06-26 DIAGNOSIS — K5790 Diverticulosis of intestine, part unspecified, without perforation or abscess without bleeding: Secondary | ICD-10-CM | POA: Diagnosis not present

## 2019-06-26 DIAGNOSIS — G5600 Carpal tunnel syndrome, unspecified upper limb: Secondary | ICD-10-CM | POA: Diagnosis not present

## 2019-06-26 DIAGNOSIS — G4733 Obstructive sleep apnea (adult) (pediatric): Secondary | ICD-10-CM | POA: Diagnosis not present

## 2019-06-26 DIAGNOSIS — Z9621 Cochlear implant status: Secondary | ICD-10-CM | POA: Diagnosis not present

## 2019-06-26 DIAGNOSIS — E785 Hyperlipidemia, unspecified: Secondary | ICD-10-CM | POA: Diagnosis not present

## 2019-06-26 DIAGNOSIS — E669 Obesity, unspecified: Secondary | ICD-10-CM | POA: Diagnosis not present

## 2019-06-26 DIAGNOSIS — G629 Polyneuropathy, unspecified: Secondary | ICD-10-CM | POA: Diagnosis not present

## 2019-06-26 DIAGNOSIS — Z6841 Body Mass Index (BMI) 40.0 and over, adult: Secondary | ICD-10-CM | POA: Diagnosis not present

## 2019-06-26 DIAGNOSIS — H9193 Unspecified hearing loss, bilateral: Secondary | ICD-10-CM | POA: Diagnosis not present

## 2019-06-26 DIAGNOSIS — Z471 Aftercare following joint replacement surgery: Secondary | ICD-10-CM | POA: Diagnosis not present

## 2019-06-26 DIAGNOSIS — Z96642 Presence of left artificial hip joint: Secondary | ICD-10-CM | POA: Diagnosis not present

## 2019-06-26 DIAGNOSIS — G43909 Migraine, unspecified, not intractable, without status migrainosus: Secondary | ICD-10-CM | POA: Diagnosis not present

## 2019-06-26 DIAGNOSIS — K760 Fatty (change of) liver, not elsewhere classified: Secondary | ICD-10-CM | POA: Diagnosis not present

## 2019-06-26 DIAGNOSIS — Z7982 Long term (current) use of aspirin: Secondary | ICD-10-CM | POA: Diagnosis not present

## 2019-06-26 DIAGNOSIS — R309 Painful micturition, unspecified: Secondary | ICD-10-CM | POA: Diagnosis not present

## 2019-06-26 DIAGNOSIS — K429 Umbilical hernia without obstruction or gangrene: Secondary | ICD-10-CM | POA: Diagnosis not present

## 2019-06-26 DIAGNOSIS — F329 Major depressive disorder, single episode, unspecified: Secondary | ICD-10-CM | POA: Diagnosis not present

## 2019-06-26 DIAGNOSIS — R32 Unspecified urinary incontinence: Secondary | ICD-10-CM | POA: Diagnosis not present

## 2019-06-30 DIAGNOSIS — Z6841 Body Mass Index (BMI) 40.0 and over, adult: Secondary | ICD-10-CM | POA: Diagnosis not present

## 2019-06-30 DIAGNOSIS — G5600 Carpal tunnel syndrome, unspecified upper limb: Secondary | ICD-10-CM | POA: Diagnosis not present

## 2019-06-30 DIAGNOSIS — K760 Fatty (change of) liver, not elsewhere classified: Secondary | ICD-10-CM | POA: Diagnosis not present

## 2019-06-30 DIAGNOSIS — Z7982 Long term (current) use of aspirin: Secondary | ICD-10-CM | POA: Diagnosis not present

## 2019-06-30 DIAGNOSIS — K429 Umbilical hernia without obstruction or gangrene: Secondary | ICD-10-CM | POA: Diagnosis not present

## 2019-06-30 DIAGNOSIS — Z96642 Presence of left artificial hip joint: Secondary | ICD-10-CM | POA: Diagnosis not present

## 2019-06-30 DIAGNOSIS — Z9621 Cochlear implant status: Secondary | ICD-10-CM | POA: Diagnosis not present

## 2019-06-30 DIAGNOSIS — G43909 Migraine, unspecified, not intractable, without status migrainosus: Secondary | ICD-10-CM | POA: Diagnosis not present

## 2019-06-30 DIAGNOSIS — K5790 Diverticulosis of intestine, part unspecified, without perforation or abscess without bleeding: Secondary | ICD-10-CM | POA: Diagnosis not present

## 2019-06-30 DIAGNOSIS — Z471 Aftercare following joint replacement surgery: Secondary | ICD-10-CM | POA: Diagnosis not present

## 2019-06-30 DIAGNOSIS — G629 Polyneuropathy, unspecified: Secondary | ICD-10-CM | POA: Diagnosis not present

## 2019-06-30 DIAGNOSIS — H9193 Unspecified hearing loss, bilateral: Secondary | ICD-10-CM | POA: Diagnosis not present

## 2019-06-30 DIAGNOSIS — E785 Hyperlipidemia, unspecified: Secondary | ICD-10-CM | POA: Diagnosis not present

## 2019-06-30 DIAGNOSIS — G4733 Obstructive sleep apnea (adult) (pediatric): Secondary | ICD-10-CM | POA: Diagnosis not present

## 2019-06-30 DIAGNOSIS — F329 Major depressive disorder, single episode, unspecified: Secondary | ICD-10-CM | POA: Diagnosis not present

## 2019-06-30 DIAGNOSIS — E669 Obesity, unspecified: Secondary | ICD-10-CM | POA: Diagnosis not present

## 2019-07-02 DIAGNOSIS — Z96642 Presence of left artificial hip joint: Secondary | ICD-10-CM | POA: Diagnosis not present

## 2019-07-02 DIAGNOSIS — Z471 Aftercare following joint replacement surgery: Secondary | ICD-10-CM | POA: Diagnosis not present

## 2019-07-07 ENCOUNTER — Ambulatory Visit: Payer: Federal, State, Local not specified - PPO | Admitting: Neurology

## 2019-07-07 DIAGNOSIS — Z96642 Presence of left artificial hip joint: Secondary | ICD-10-CM | POA: Diagnosis not present

## 2019-07-09 ENCOUNTER — Other Ambulatory Visit: Payer: Self-pay | Admitting: Neurology

## 2019-07-09 DIAGNOSIS — G4733 Obstructive sleep apnea (adult) (pediatric): Secondary | ICD-10-CM

## 2019-07-10 DIAGNOSIS — Z96642 Presence of left artificial hip joint: Secondary | ICD-10-CM | POA: Diagnosis not present

## 2019-07-13 DIAGNOSIS — Z96642 Presence of left artificial hip joint: Secondary | ICD-10-CM | POA: Diagnosis not present

## 2019-07-15 DIAGNOSIS — Z96642 Presence of left artificial hip joint: Secondary | ICD-10-CM | POA: Diagnosis not present

## 2019-07-17 DIAGNOSIS — Z96642 Presence of left artificial hip joint: Secondary | ICD-10-CM | POA: Diagnosis not present

## 2019-07-20 DIAGNOSIS — Z96642 Presence of left artificial hip joint: Secondary | ICD-10-CM | POA: Diagnosis not present

## 2019-07-22 DIAGNOSIS — Z96642 Presence of left artificial hip joint: Secondary | ICD-10-CM | POA: Diagnosis not present

## 2019-07-24 DIAGNOSIS — Z96642 Presence of left artificial hip joint: Secondary | ICD-10-CM | POA: Diagnosis not present

## 2019-07-28 DIAGNOSIS — Z9889 Other specified postprocedural states: Secondary | ICD-10-CM | POA: Diagnosis not present

## 2019-07-28 DIAGNOSIS — Z96642 Presence of left artificial hip joint: Secondary | ICD-10-CM | POA: Diagnosis not present

## 2019-08-13 DIAGNOSIS — Z96642 Presence of left artificial hip joint: Secondary | ICD-10-CM | POA: Diagnosis not present

## 2019-08-17 ENCOUNTER — Encounter: Payer: Self-pay | Admitting: Neurology

## 2019-08-17 DIAGNOSIS — Z96642 Presence of left artificial hip joint: Secondary | ICD-10-CM | POA: Diagnosis not present

## 2019-08-18 ENCOUNTER — Ambulatory Visit: Payer: Federal, State, Local not specified - PPO | Admitting: Neurology

## 2019-08-18 ENCOUNTER — Encounter: Payer: Self-pay | Admitting: Neurology

## 2019-08-18 ENCOUNTER — Other Ambulatory Visit: Payer: Self-pay

## 2019-08-18 VITALS — BP 156/100 | HR 103 | Temp 97.5°F | Ht 62.0 in | Wt 225.0 lb

## 2019-08-18 DIAGNOSIS — H903 Sensorineural hearing loss, bilateral: Secondary | ICD-10-CM | POA: Diagnosis not present

## 2019-08-18 DIAGNOSIS — Z9989 Dependence on other enabling machines and devices: Secondary | ICD-10-CM

## 2019-08-18 DIAGNOSIS — G4733 Obstructive sleep apnea (adult) (pediatric): Secondary | ICD-10-CM | POA: Diagnosis not present

## 2019-08-18 NOTE — Progress Notes (Signed)
SLEEP MEDICINE CLINIC   Provider:  Melvyn Novasarmen  Shayon Trompeter, M D  Primary Care Physician:  Johny BlamerHarris, William, MD   Referring Provider: Johny BlamerHarris, William, MD    Chief Complaint  Patient presents with  . Follow-up    pt alone, rm 10. states she is planning to continue the CPAP dental device was going to cost 800$     RV on 08-18-2019 for this long time established patient with severe hearing loss. Mrs. Rhonda Simmons had discussed in her last visit with me that she may prefer a dental device for the treatment of sleep apnea but the cost of about $800 were too expensive for her.  The patient has a history of degenerative disc disease in the cervical spine, obesity, dysesthesia of multiple sites, intractable migraine and obstructive sleep apnea. She also has TMJ pain which can make a dental device trickier to manufacture.  While she recovered from hip surgery ( 06-12-2019)  she became poorly compliant with CAP as she slept on the sofa-  She is still in PT.   However due to the lack of alternative in the treatment of obstructive sleep apnea we may have to return to see obstructive sleep apnea treatment by CPAP.  The last time the patient used her machine was in October at the time she used it on average for 7 hours 5 minutes, she is using an AutoSet with a minimum pressure of 5 and a maximum pressure of 10 cmH2O with 3 cm EPR and her AHI was 3.6.  There were almost equal central and obstructive apneas present.  She endorsed the Epworth Sleepiness Scale today at 15 which is a higher number.  She has had a lot of stress currently , her daughter has emancipated herself  from her parents, but she is not even calling her after surgery or for her birthday. There is a boyfriend the parents did not approve of.     I have the pleasure of meeting today with [name] L. Rhonda Simmons, a 58 year old longtime established female patient.  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  She has a lifelong hearing deficit.  She has a history of waxing and waning migrainous headaches in the past, those have improved.  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  She also developed muscle aches and pains and was suspected to have polymyalgia rheumatica but further laboratory tests have not confirmed that.  However she felt much better while on steroids and her pain discomfort and ache returned after the steroids were weaned off.  She will no longer be followed by rheumatology, gave her diclofenac . PA at the orthopedics office filled tizanidine.  She has severe hip pain, and is too heavy for the surgery.   Mrs. Rhonda Simmons used her CPAP 29 out of 30 days with a compliance of 97% for time with an average of 8 hours and 6 minutes.  CPAP remains set at 7 cm water pressure with 3 cm EPR her 95th percentile pressure is currently at 12.6, her AHI is 4.0 it seems that all apneas are obstructive in nature.  She is using a nasal pillow interface.  I would like to increase the set pressure from 6-10 cmH2O.     03-26-2018;  Luevenia MaxinLaura L Withrow is a 58 y.o. female , seen here as in a referral  from her optometrist , Pauline Aus , OD for  "terrible migraine headaches " and " I feel tired all the time". It was her eye doctor that saw her for sharp pains that seem to be located behind the eye, headaches, vision impairment this shadow and overlapping of visual field. All decreased acuity and dry eyes bilaterally. Medication list includes Cymbalta, Excedrin and Motrin.  I reviewed her ophthalmology records she has a corrected vision is 20/50 on the right, 20/30 on the right. She was suspected to develop astigmatism, hyperopia, presbyopia. Nuclear cataract at this time not mature enough for surgical procedure. Headaches which could be either related but the patient cannot have an MRI because  of cochlear implants. And she has a small cyst at the temporal fovea.  Chief complaint according to patient : Mrs. [name] reports that she has had headaches nearly every morning when she wakes up, associated with neck stiffness, nausea and at times vomiting. She is status post anterior cervical fusion and does have a titanium rod implanted. She has had headaches for a long time but not to this intensity, not of the same quality and not of the same frequency. She often feels as if there is pressure applied on the eyeball and she actually saw ophthalmology, and needs to return for visual field examination. She states that beginning cataracts were suspected. She has TMJ and bruxism. She rarely is feeling well.  She has no energy to clean house, do anything. Takes Excedrin. She had undergone a spinal tap in high school and developed severe headaches, was given compazine - had a seizure at age 53- attributed to the medication . Lost awareness for 2-3 minutes.  She was diagnosed years ago with sleep apnea, not in our lab, she's not using CPAP.  I have known [name] over 10 years as her father's and mother's neurologist and I have also seen her daughter for headaches. 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  Sleep habits are as follows: goes to bed with headaches, but often after having slept on and off all afternoon.  The patient reports that she goes anytime between 11 PM and 2 AM to bed - she does not have an established routine.   Sleep medical history and family sleep history: Her father had advancing Alzheimer's dementia before his death in 15 D[place] Rhody) . Mother has had syncope, spells, dysphagia, failure to thrive , an TIA.   Social history:  Married, one daughter ( 29) . One son ( 24)  Rare ( one a week ) mountain dew , 2-3 iced tea a week. No tobacco use of any kind, seldomly alcohol -not  more  than 2 or 3 times a year.   Interval History following her sleep studies, 03-26-2018. Mrs Sasaki reports good sleep on CPAP and her husband is happy she doesn't snore. She reports myalgias ,all over soreness, discomfort, weakness - she cannot lift arms above shoulder, standing up from a seated position causes pain in hamstrings. Climbing stairs is difficult. Proximal myalgia- myositis? She has seen ortho PA, who ordered blood work . She is supposed to have those today at [facility].     DIAGNOSIS  - PSG from PSG on 06/11/17 which showed moderately-severe Obstructive Sleep Apnea (OSA) with overall AHI of 29.4/hr. No significant supine positional accentuation, and snoring was loudest in REM. The Wake baseline 02 saturation was 98%, with the  lowest being 82%. Time spent below 89% saturation equaled 12 minutes.Obstructive Sleep Apnea, responding well to only 7 cm water pressure, heated humidity and nasal pillow. The patient was fitted with a ResMed AirFit P10 (Small).Upper Airway Resistance Syndrome.   PLANS/RECOMMENDATIONS:   CPAP was ordered. 7 cm water pressure, heated humidity and nasal pillow ResMed AirFit P10 (Small).  DISCUSSION:  Melvyn Novas, M.D.   08-07-2017 .       Review of Systems: Out of a complete 14 system review, the patient complains of only the following symptoms, and all other reviewed systems are negative.  CPAP compliance was much better 10-16-2018- CPAP set at 7 cm, residual AHI 4/h OK resolution.  She has a cochlear implant and I cannot order MRI> she has not been treated with statins, she has no dermatomal pain,and no rash ( no dermatomyositis)   Headaches improved, now myalgia. Not longer snoring. OSA diasgnosed. Epworth sleepiness Score on CPAP 9 from 13/ 24, the  FSS at  48/ 63  points.     Social History   Socioeconomic History  . Marital status: Married    Spouse name: Not on file  . Number of children: 2  . Years of education: assoc  . Highest education  level: Not on file  Occupational History  . Occupation: caretaker    Comment: for parents  Tobacco Use  . Smoking status: Never Smoker  . Smokeless tobacco: Never Used  Substance and Sexual Activity  . Alcohol use: Yes    Comment: occas  . Drug use: Not Currently  . Sexual activity: Not on file  Other Topics Concern  . Not on file  Social History Narrative  . Not on file   Social Determinants of Health   Financial Resource Strain:   . Difficulty of Paying Living Expenses: Not on file  Food Insecurity:   . Worried About Programme researcher, broadcasting/film/video in the Last Year: Not on file  . Ran Out of Food in the Last Year: Not on file  Transportation Needs:   . Lack of Transportation (Medical): Not on file  . Lack of Transportation (Non-Medical): Not on file  Physical Activity:   . Days of Exercise per Week: Not on file  . Minutes of Exercise per Session: Not on file  Stress:   . Feeling of Stress : Not on file  Social Connections:   . Frequency of Communication with Friends and Family: Not on file  . Frequency of Social Gatherings with Friends and Family: Not on file  . Attends Religious Services: Not on file  . Active Member of Clubs or Organizations: Not on file  . Attends Banker Meetings: Not on file  . Marital Status: Not on file  Intimate Partner Violence:   . Fear of Current or Ex-Partner: Not on file  . Emotionally Abused: Not on file  . Physically Abused: Not on file  . Sexually Abused: Not on file    Family History  Problem Relation Age of Onset  . Alzheimer's disease Mother   . Alzheimer's disease Father     Past Medical History:  Diagnosis Date  . Asthma    pt denies  . Chronic bronchitis    Dr. Maple Hudson  . Complication of anesthesia    pt stated woke up too soon, crying  . Depression   . Diverticulosis    pt unaware  . Fatty liver   . Hearing impaired   . 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 History of carpal tunnel syndrome   .  Hyperlipidemia   . Migraines   . Neuropathy    pt unaware  . OA (osteoarthritis)   . Obesity   . OSA (obstructive sleep apnea)   . Plantar fasciitis, right   . Umbilical hernia     Past Surgical History:  Procedure Laterality Date  . ABDOMINAL HYSTERECTOMY    . CARPAL TUNNEL RELEASE Right   . CESAREAN SECTION     x2  . COCHLEAR IMPLANT Left   . COCHLEAR IMPLANT REVISION Left 01/2016  . COLONOSCOPY    . DENTAL SURGERY    . NECK SURGERY     Rods and screws placed  . SHOULDER ARTHROSCOPY Right   . TOTAL HIP ARTHROPLASTY Left 06/12/2019   Procedure: TOTAL HIP ARTHROPLASTY ANTERIOR APPROACH;  Surgeon: Jodi GeraldsGraves, John, MD;  Location: [facility];  Service: Orthopedics;  Laterality: Left;  . UPPER GI ENDOSCOPY      Current Outpatient Medications  Medication Sig Dispense Refill  . ALPRAZolam (XANAX) 0.25 MG tablet Take 0.25 mg by mouth daily as needed for anxiety.     Marland Kitchen. aspirin EC 325 MG tablet Take 1 tablet (325 mg total) by mouth 2 (two) times daily after a meal. Take x 1 month post op to decrease risk of blood clots. 60 tablet 0  . Biotin 5000 MCG TABS Take 5,000 mcg by mouth daily.    . celecoxib (CELEBREX) 200 MG capsule Take 200 mg by mouth daily.    . cyclobenzaprine (FLEXERIL) 10 MG tablet Take 10 mg by mouth 3 (three) times daily as needed for muscle spasms.    Marland Kitchen. docusate sodium (COLACE) 100 MG capsule Take 1 capsule (100 mg total) by mouth 2 (two) times daily. 30 capsule 0  . DULoxetine (CYMBALTA) 60 MG capsule Take 60 mg by mouth daily.   6  . EPINEPHrine (EPIPEN 2-PAK) 0.3 mg/0.3 mL IJ SOAJ injection Inject 0.3 mLs (0.3 mg total) into the muscle once as needed (for severe allergic reaction). CAll 911 immediately if you have to use this medicine 1 Device 0  . hydrOXYzine (ATARAX/VISTARIL) 25 MG tablet Take 25 mg by mouth every 6 (six) hours as needed for itching.     . liraglutide (VICTOZA) 18 MG/3ML SOPN Inject 2 mg into the skin daily.     Marland Kitchen. oxyCODONE-acetaminophen  (PERCOCET/ROXICET) 5-325 MG tablet Take 1-2 tablets by mouth every 6 (six) hours as needed for severe pain. 40 tablet 0  . zolpidem (AMBIEN) 10 MG tablet Take 10 mg by mouth at bedtime as needed for sleep.   0   No current facility-administered medications for this visit.    Allergies as of 08/18/2019 - Review Complete 08/18/2019  Allergen Reaction Noted  . Compazine  10/26/2011  . Fexofenadine  10/27/2007  . Midol [aspirin-cinnamedrine-caffeine] Swelling 12/01/2012  . Naproxen sodium Swelling     Vitals: BP (!) 156/100   Pulse (!) 103   Temp (!) 97.5 F (36.4 C)   Ht 5\' 2"  (1.575 m)   Wt 225 lb (102.1 kg)   BMI 41.15 kg/m  Last Weight:  Wt Readings from Last 1 Encounters:  08/18/19 225 lb (102.1 kg)   MWU:XLKGBMI:Body mass index is 41.15 kg/m.     Last Height:   Ht Readings from Last 1 Encounters:  08/18/19 5\' 2"  (1.575 m)    Physical exam:  General: The patient is awake, alert and appears not in acute distress. The patient is  well groomed. Head: Normocephalic, atraumatic. Neck is supple. Mallampati 4,  neck circumference:17. Nasal airflow -congestion noted  . Retrognathia is seen.  Cardiovascular:  Regular rate and rhythm, without  murmurs or carotid bruit, and without distended neck veins. Respiratory: Lungs are clear to auscultation. Skin:  Without evidence of edema, or rash Trunk: BMI is over 44- further increased over the last 18 month.  The patient's posture is erect  Neurologic exam :The patient is awake and alert, oriented to place and time.   Memory subjective described as intact. Attention span & concentration ability appears normal.  Speech is fluent, without dysarthria, dysphonia or aphasia. Mood and affect are appropriate.  Cranial nerves: Pupils are equal and briskly reactive to light. Extraocular movements  in vertical and horizontal planes intact and without nystagmus. Visual fields by finger perimetry are intact. Hearing impaired but improved with cochlear  implant.  Facial sensation intact to fine touch.  Facial motor strength is symmetric , her  tongue and uvula move midline. Shoulder shrug was symmetrical.   Motor exam:  Passive range of motion of the neck as well as active neck motion retroflexion rotation and lateral bending all lead to a audible crackles. Crepitus. Normal tone, muscle bulk and symmetric strength in all extremities.  Sensory:  Fine touch, pinprick and vibration were tested in all extremities. Proprioception tested in the upper extremities was normal.  Coordination: Rapid alternating movements in the fingers/hands was normal. Finger-to-nose maneuver  normal without evidence of ataxia, dysmetria or tremor.  Gait and station: Patient walks without assistive device and is able unassisted to climb up to the exam table. Strength within normal limits. Stance is stable and normal.  Romberg testing is  negative. Deep tendon reflexes: in the upper and lower extremities are symmetric and intact. Babinski maneuver response is downgoing.    Assessment:  After physical and neurologic examination, review of laboratory studies,  Personal review of imaging studies, reports of other /same  Imaging studies, results of polysomnography and / or neurophysiology testing and pre-existing records as far as provided in visit., my assessment is   1) OSA diagnosed and treated on CPAP - sleep improved . She is less insomnic and less fatigued. 7 hours of average sleep time.  CPAP supplies were ordered. - nasal pillow ResMed AirFit P10 (Small).  2)Obesity -  Has increased weight on steroids- which have been helping her with pain. Now reached BMI 41.15 kg/m2. she has seen abdominal surgeon Kaylyn Lim, M.D, who recommended that she could undergo bariatric surgery. Depending on the sleep apnea evaluation results this may still be something she should pursue.   I spent more than 20 minutes of face to face time with the patient.  Greater than 50% of time was  spent in counseling and coordination of care. We have discussed the diagnosis and differential and I answered the patient's questions.    Plan:  Treatment plan and additional workup :  For now, going back to CPAP at current settings is the only option.  I ordered new supplies for her , specific nasal pillow in Small size.  I will adjust the auto-set from 6-10 cm water pressure with EPR of 3 cm, she was highly compliant until September.  2) physical therapy in preparation for hip surgery, weight loss needed. She seems depressed, too.   She has voiced again  an interest in bariatric surgery.  She is interested in medical weight and wellness, and SAXENDA. She should discuss with Dr Kenton Kingfisher.  RV in 6 Month .  Melvyn Novas, MD 08/18/2019, 2:17 PM  Certified in Neurology by ABPN Certified in Sleep Medicine by [facility] [address] [place], [place] [address]

## 2019-08-24 DIAGNOSIS — Z96642 Presence of left artificial hip joint: Secondary | ICD-10-CM | POA: Diagnosis not present

## 2019-08-31 DIAGNOSIS — Z96642 Presence of left artificial hip joint: Secondary | ICD-10-CM | POA: Diagnosis not present

## 2019-09-08 DIAGNOSIS — H40013 Open angle with borderline findings, low risk, bilateral: Secondary | ICD-10-CM | POA: Diagnosis not present

## 2019-09-08 DIAGNOSIS — H40033 Anatomical narrow angle, bilateral: Secondary | ICD-10-CM | POA: Diagnosis not present

## 2019-09-08 DIAGNOSIS — H04123 Dry eye syndrome of bilateral lacrimal glands: Secondary | ICD-10-CM | POA: Diagnosis not present

## 2019-09-08 DIAGNOSIS — H35351 Cystoid macular degeneration, right eye: Secondary | ICD-10-CM | POA: Diagnosis not present

## 2019-09-15 DIAGNOSIS — Z96642 Presence of left artificial hip joint: Secondary | ICD-10-CM | POA: Diagnosis not present

## 2019-09-21 DIAGNOSIS — Z96642 Presence of left artificial hip joint: Secondary | ICD-10-CM | POA: Diagnosis not present

## 2019-09-24 DIAGNOSIS — Z96642 Presence of left artificial hip joint: Secondary | ICD-10-CM | POA: Diagnosis not present

## 2019-09-24 DIAGNOSIS — M25552 Pain in left hip: Secondary | ICD-10-CM | POA: Diagnosis not present

## 2019-10-01 DIAGNOSIS — K080 Exfoliation of teeth due to systemic causes: Secondary | ICD-10-CM | POA: Diagnosis not present

## 2019-10-02 DIAGNOSIS — Z96642 Presence of left artificial hip joint: Secondary | ICD-10-CM | POA: Diagnosis not present

## 2019-10-05 DIAGNOSIS — Z96642 Presence of left artificial hip joint: Secondary | ICD-10-CM | POA: Diagnosis not present

## 2019-11-16 DIAGNOSIS — K080 Exfoliation of teeth due to systemic causes: Secondary | ICD-10-CM | POA: Diagnosis not present

## 2019-11-26 ENCOUNTER — Ambulatory Visit: Payer: Federal, State, Local not specified - PPO | Attending: Internal Medicine

## 2019-11-26 DIAGNOSIS — Z23 Encounter for immunization: Secondary | ICD-10-CM

## 2019-11-26 NOTE — Progress Notes (Signed)
   Covid-19 Vaccination Clinic  Name:  Rhonda Simmons    MRN: [id number] DOB: 15-May-1961  11/26/2019  [name] was observed post Covid-19 immunization for 15 minutes without incident. She was provided with Vaccine Information Sheet and instruction to access the V-Safe system.   [name] was instructed to call 911 with any severe reactions post vaccine: Marland Kitchen Difficulty breathing  . Swelling of face and throat  . A fast heartbeat  . A bad rash all over body  . Dizziness and weakness   Immunizations Administered    Name Date Dose VIS Date Route   Pfizer COVID-19 Vaccine 11/26/2019 12:01 PM 0.3 mL 08/14/2019 Intramuscular   Manufacturer: [employer], [employer]   Lot: [id number]   NDC: [address]-4

## 2019-12-01 DIAGNOSIS — Z96642 Presence of left artificial hip joint: Secondary | ICD-10-CM | POA: Diagnosis not present

## 2019-12-01 DIAGNOSIS — Z09 Encounter for follow-up examination after completed treatment for conditions other than malignant neoplasm: Secondary | ICD-10-CM | POA: Diagnosis not present

## 2019-12-08 DIAGNOSIS — F419 Anxiety disorder, unspecified: Secondary | ICD-10-CM | POA: Diagnosis not present

## 2019-12-08 DIAGNOSIS — F324 Major depressive disorder, single episode, in partial remission: Secondary | ICD-10-CM | POA: Diagnosis not present

## 2019-12-08 DIAGNOSIS — I10 Essential (primary) hypertension: Secondary | ICD-10-CM | POA: Diagnosis not present

## 2019-12-08 DIAGNOSIS — E782 Mixed hyperlipidemia: Secondary | ICD-10-CM | POA: Diagnosis not present

## 2019-12-21 DIAGNOSIS — Z96642 Presence of left artificial hip joint: Secondary | ICD-10-CM | POA: Diagnosis not present

## 2019-12-23 ENCOUNTER — Ambulatory Visit: Payer: Federal, State, Local not specified - PPO | Attending: Internal Medicine

## 2019-12-23 DIAGNOSIS — Z23 Encounter for immunization: Secondary | ICD-10-CM

## 2019-12-23 NOTE — Progress Notes (Signed)
   Covid-19 Vaccination Clinic  Name:  LAKIYA COTTAM    MRN: [id number] DOB: 1961-05-24  12/23/2019  Ms. Bas was observed post Covid-19 immunization for 15 minutes without incident. She was provided with Vaccine Information Sheet and instruction to access the V-Safe system.   Ms. Huneycutt was instructed to call 911 with any severe reactions post vaccine: Marland Kitchen Difficulty breathing  . Swelling of face and throat  . A fast heartbeat  . A bad rash all over body  . Dizziness and weakness   Immunizations Administered    Name Date Dose VIS Date Route   Pfizer COVID-19 Vaccine 12/23/2019 10:13 AM 0.3 mL 10/28/2018 Intramuscular   Manufacturer: [employer], [employer]   Lot: [id number]   NDC: [address]-6

## 2019-12-28 DIAGNOSIS — L509 Urticaria, unspecified: Secondary | ICD-10-CM | POA: Diagnosis not present

## 2020-01-08 DIAGNOSIS — H35351 Cystoid macular degeneration, right eye: Secondary | ICD-10-CM | POA: Diagnosis not present

## 2020-01-08 DIAGNOSIS — H40033 Anatomical narrow angle, bilateral: Secondary | ICD-10-CM | POA: Diagnosis not present

## 2020-01-08 DIAGNOSIS — H40013 Open angle with borderline findings, low risk, bilateral: Secondary | ICD-10-CM | POA: Diagnosis not present

## 2020-01-08 DIAGNOSIS — R519 Headache, unspecified: Secondary | ICD-10-CM | POA: Diagnosis not present

## 2020-01-08 DIAGNOSIS — H04123 Dry eye syndrome of bilateral lacrimal glands: Secondary | ICD-10-CM | POA: Diagnosis not present

## 2020-01-11 DIAGNOSIS — Z96642 Presence of left artificial hip joint: Secondary | ICD-10-CM | POA: Diagnosis not present

## 2020-01-13 DIAGNOSIS — Z6841 Body Mass Index (BMI) 40.0 and over, adult: Secondary | ICD-10-CM | POA: Diagnosis not present

## 2020-01-13 DIAGNOSIS — H905 Unspecified sensorineural hearing loss: Secondary | ICD-10-CM | POA: Diagnosis not present

## 2020-01-13 DIAGNOSIS — M26609 Unspecified temporomandibular joint disorder, unspecified side: Secondary | ICD-10-CM | POA: Diagnosis not present

## 2020-01-26 DIAGNOSIS — R600 Localized edema: Secondary | ICD-10-CM | POA: Diagnosis not present

## 2020-02-04 DIAGNOSIS — R102 Pelvic and perineal pain: Secondary | ICD-10-CM | POA: Diagnosis not present

## 2020-02-12 DIAGNOSIS — Z9621 Cochlear implant status: Secondary | ICD-10-CM | POA: Diagnosis not present

## 2020-02-12 DIAGNOSIS — M26622 Arthralgia of left temporomandibular joint: Secondary | ICD-10-CM | POA: Diagnosis not present

## 2020-02-12 DIAGNOSIS — H903 Sensorineural hearing loss, bilateral: Secondary | ICD-10-CM | POA: Diagnosis not present

## 2020-02-12 DIAGNOSIS — L659 Nonscarring hair loss, unspecified: Secondary | ICD-10-CM | POA: Diagnosis not present

## 2020-02-12 DIAGNOSIS — R600 Localized edema: Secondary | ICD-10-CM | POA: Insufficient documentation

## 2020-02-12 DIAGNOSIS — H9202 Otalgia, left ear: Secondary | ICD-10-CM | POA: Diagnosis not present

## 2020-02-12 DIAGNOSIS — L309 Dermatitis, unspecified: Secondary | ICD-10-CM | POA: Diagnosis not present

## 2020-02-16 ENCOUNTER — Telehealth: Payer: Self-pay | Admitting: Neurology

## 2020-02-16 IMAGING — CT CT HIP*L* W/O CM
1 series · 16 of 32 positions shown, 20 images · non-contrast
Comparison: CT abdomen and pelvis 07/11/2016.

CLINICAL DATA: Left hip pain for several months.  No known injury.

EXAM:
CT OF THE LEFT HIP WITHOUT CONTRAST
TECHNIQUE: Multidetector CT imaging of the left hip was performed according to
the standard protocol. Multiplanar CT image reconstructions were
also generated.

[Series 3: soft tissue pelvis/hip · axial · 0.41mm/px · z∈[-228,-92]mm · 16 of 51 slices shown, 20 images]
[im 4/51  soft-tissue]
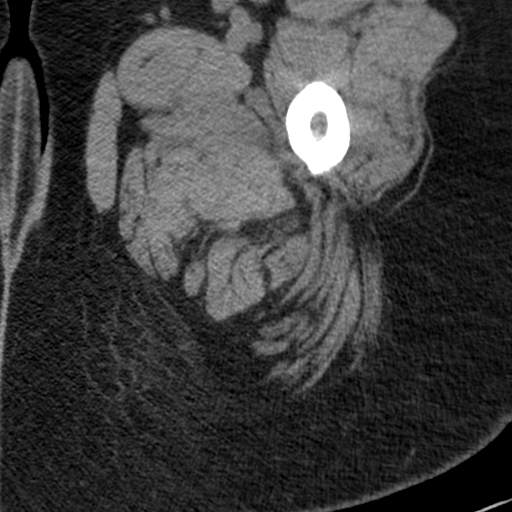
[im 4/51  bone]
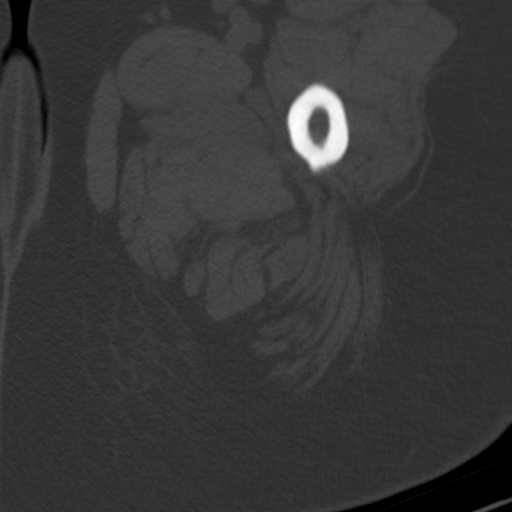
[im 7/51  soft-tissue]
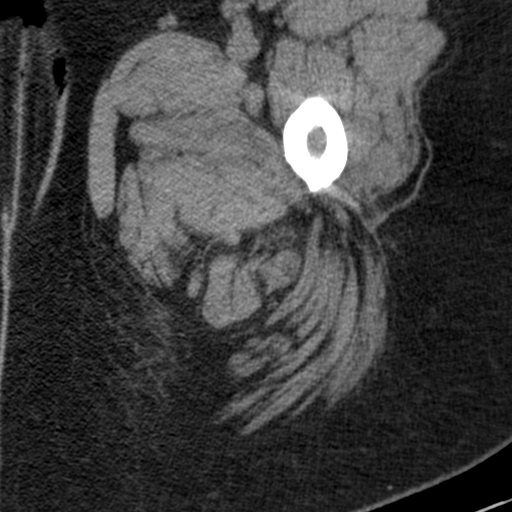
[im 10/51  soft-tissue]
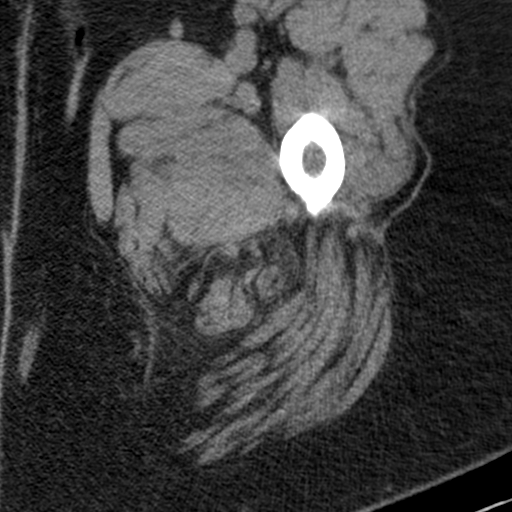
[im 13/51  soft-tissue]
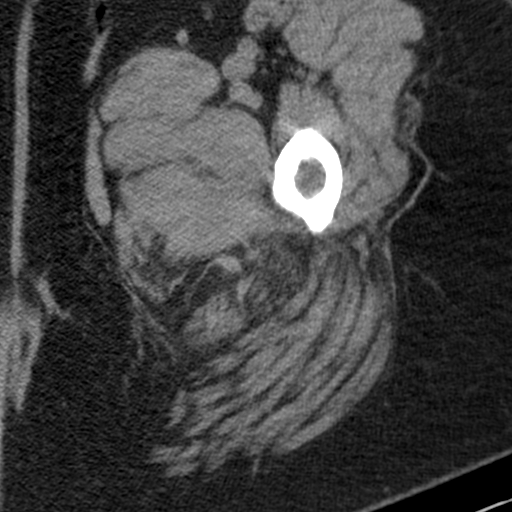
[im 17/51  soft-tissue]
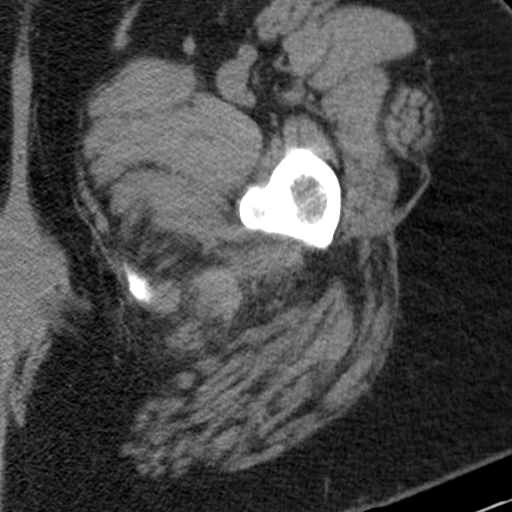
[im 20/51  soft-tissue]
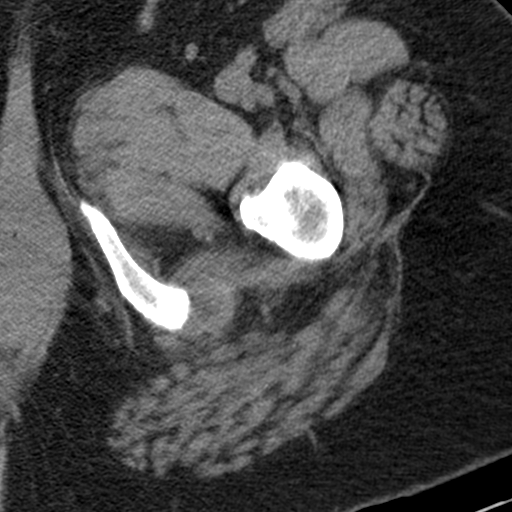
[im 23/51  soft-tissue]
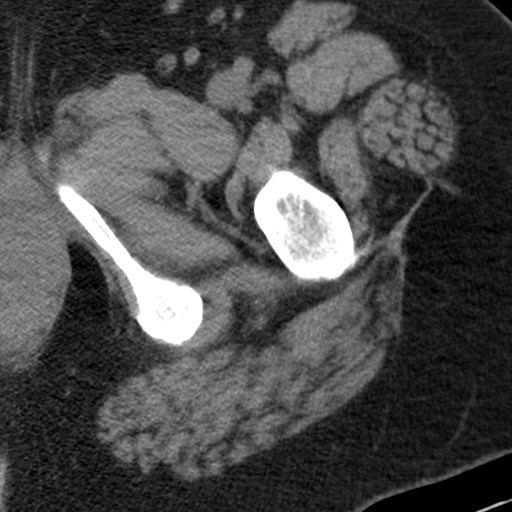
[im 28/51  soft-tissue]
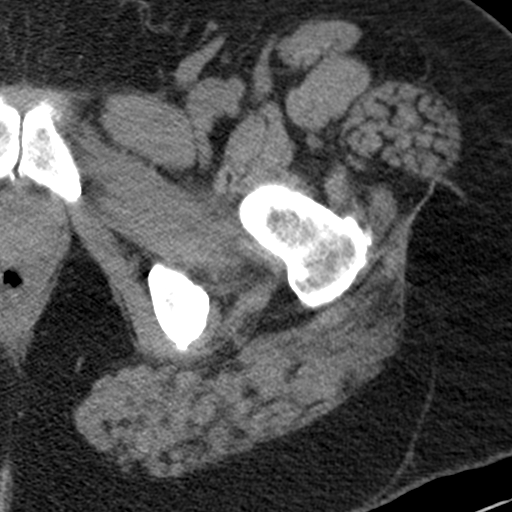
[im 31/51  soft-tissue]
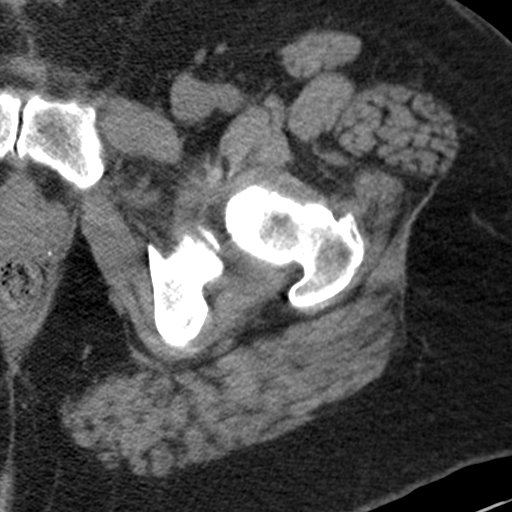
[im 31/51  bone]
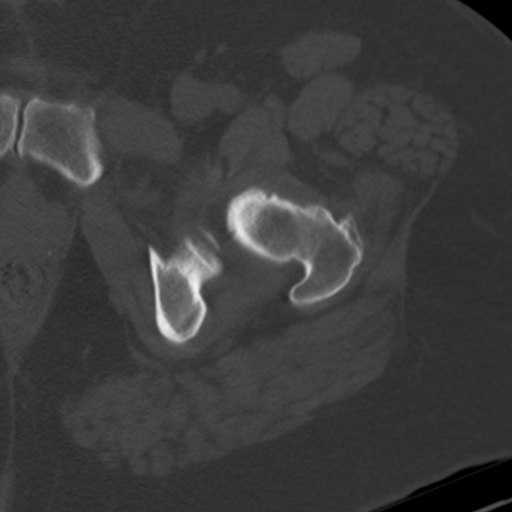
[im 34/51  soft-tissue]
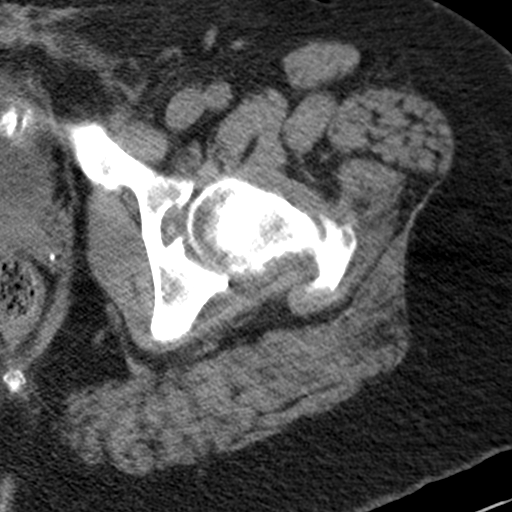
[im 38/51  soft-tissue]
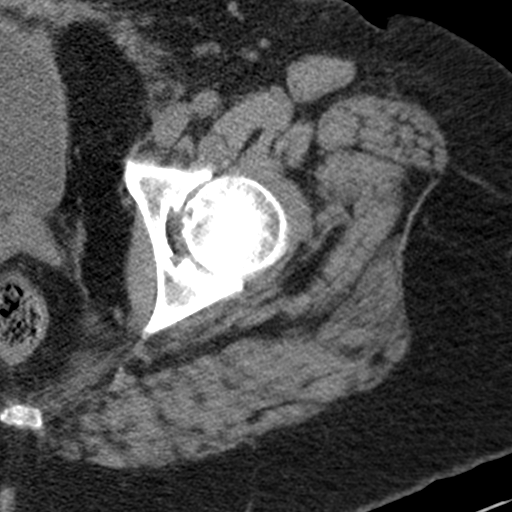
[im 41/51  soft-tissue]
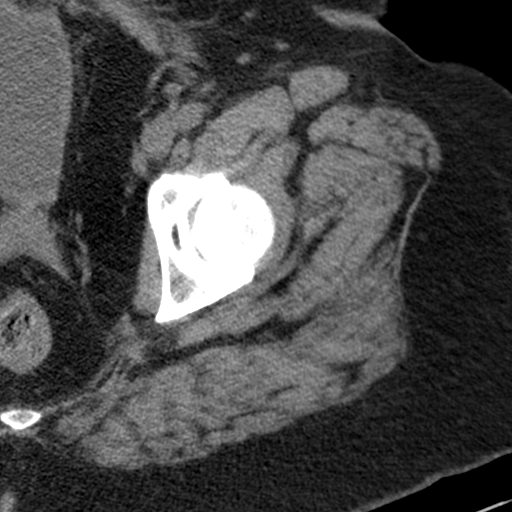
[im 44/51  soft-tissue]
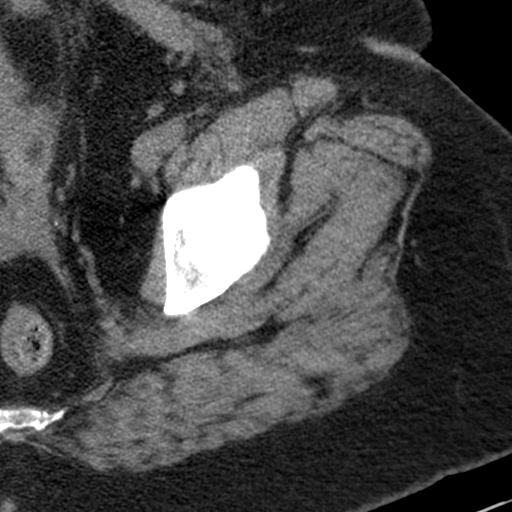
[im 44/51  lung]
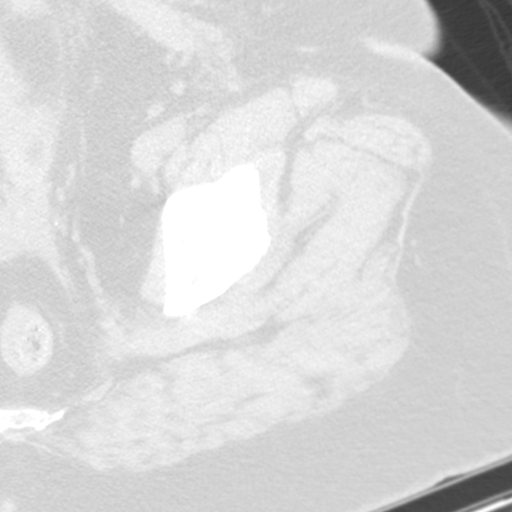
[im 46/51  lung]
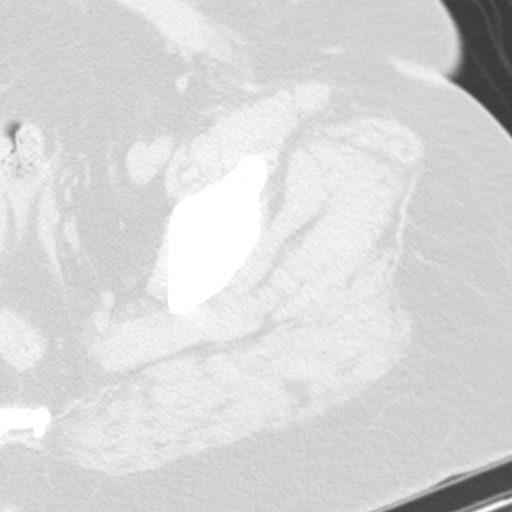
[im 47/51  soft-tissue]
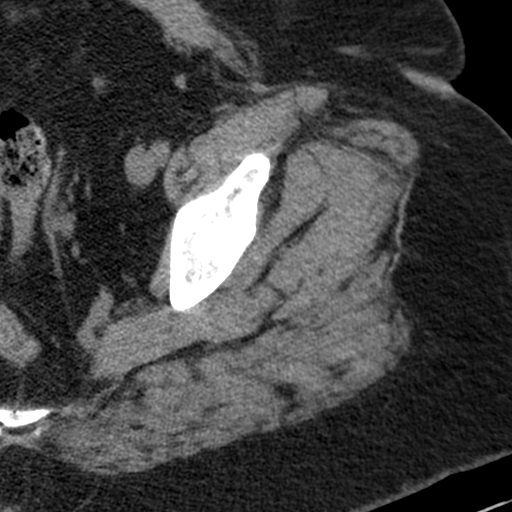
[im 47/51  lung]
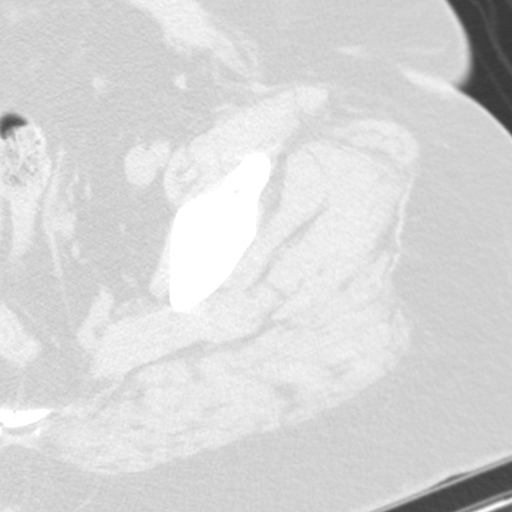
[im 49/51  lung]
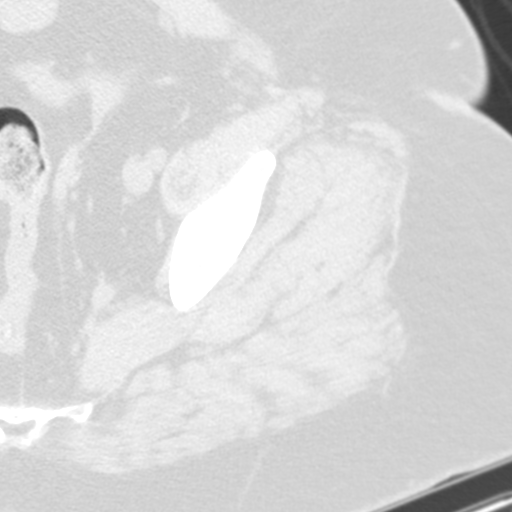

[16 of 32 positions shown; findings below may reference images not displayed]

FINDINGS: Bones/Joint/Cartilage

Bone-on-bone joint space narrowing of the left hip is seen and has
progressed since the prior CT. The collar osteophytes about the left
femoral head have also worsened since the prior CT. No fracture or
focal bony lesion is identified. No avascular necrosis of the left
femoral head.

Ligaments

Suboptimally assessed by CT.

Muscles and Tendons

Intact and normal appearance.

Soft tissues

Imaged intrapelvic contents demonstrate no acute abnormality. The
patient is status post hysterectomy.
IMPRESSION: Moderately severe to severe left hip osteoarthritis has markedly
worsened since the 6864 CT abdomen and pelvis. No acute abnormality.

## 2020-02-16 NOTE — Telephone Encounter (Signed)
Called the patient back. Patient has not been able to use the CPAP like she is suppose to because of the surgeries. She was concerned about restarting the machine and it possibly being too much pressure. Advised the patient that it is set at lowest pressure of 5 and auto range of 5-10 cm water pressure. Encouraged the patient to begin restarting the machine and if she begins using it and it is working fine then great but if after using consistently for 2 weeks she is having difficulty she can call us back and I will pull a download and Dr Vickey Huger will review to see if adjustments need to be made. Pt verbalized understanding.

## 2020-02-16 NOTE — Telephone Encounter (Signed)
Pt is requesting call back from nurse regarding her sleep machine needing to be turned down. Please advise.

## 2020-02-17 ENCOUNTER — Ambulatory Visit: Payer: Federal, State, Local not specified - PPO | Admitting: Neurology

## 2020-03-01 DIAGNOSIS — M6281 Muscle weakness (generalized): Secondary | ICD-10-CM | POA: Diagnosis not present

## 2020-03-01 DIAGNOSIS — R3915 Urgency of urination: Secondary | ICD-10-CM | POA: Diagnosis not present

## 2020-03-01 DIAGNOSIS — R102 Pelvic and perineal pain: Secondary | ICD-10-CM | POA: Diagnosis not present

## 2020-03-01 DIAGNOSIS — N393 Stress incontinence (female) (male): Secondary | ICD-10-CM | POA: Diagnosis not present

## 2020-03-15 DIAGNOSIS — N393 Stress incontinence (female) (male): Secondary | ICD-10-CM | POA: Diagnosis not present

## 2020-03-15 DIAGNOSIS — R102 Pelvic and perineal pain: Secondary | ICD-10-CM | POA: Diagnosis not present

## 2020-03-15 DIAGNOSIS — N3941 Urge incontinence: Secondary | ICD-10-CM | POA: Diagnosis not present

## 2020-03-15 DIAGNOSIS — M6281 Muscle weakness (generalized): Secondary | ICD-10-CM | POA: Diagnosis not present

## 2020-04-12 ENCOUNTER — Ambulatory Visit: Payer: Federal, State, Local not specified - PPO | Admitting: Neurology

## 2020-04-12 ENCOUNTER — Telehealth: Payer: Self-pay | Admitting: Neurology

## 2020-04-12 NOTE — Telephone Encounter (Signed)
Pt showed up late for her follow up visit. This is considered a no show.

## 2020-04-26 ENCOUNTER — Ambulatory Visit: Payer: Federal, State, Local not specified - PPO | Admitting: Podiatry

## 2020-04-26 ENCOUNTER — Other Ambulatory Visit: Payer: Self-pay

## 2020-04-26 ENCOUNTER — Encounter: Payer: Self-pay | Admitting: Podiatry

## 2020-04-26 DIAGNOSIS — M722 Plantar fascial fibromatosis: Secondary | ICD-10-CM

## 2020-04-26 DIAGNOSIS — F32A Depression, unspecified: Secondary | ICD-10-CM | POA: Insufficient documentation

## 2020-04-26 DIAGNOSIS — M1990 Unspecified osteoarthritis, unspecified site: Secondary | ICD-10-CM | POA: Insufficient documentation

## 2020-04-26 DIAGNOSIS — E7800 Pure hypercholesterolemia, unspecified: Secondary | ICD-10-CM | POA: Insufficient documentation

## 2020-04-26 NOTE — Progress Notes (Signed)
  Subjective:  Patient ID: Rhonda Simmons, female    DOB: 10/30/1960,  MRN: [id number]  Chief Complaint  Patient presents with  . Foot Pain    Follow up plantar fasciitis right - patient has orthotics from 2019 visit that she has never worn due to length, she is still having pain in the right arch    59 y.o. female presents with the above complaint. History confirmed with patient. States she got a hip replacement and is concerned she is walking differently since this.   Objective:  Physical Exam: warm, good capillary refill, no trophic changes or ulcerative lesions, normal DP and PT pulses and normal sensory exam. Left Foot: POP medial arch  Right Foot: POP medial calc tuber, medial arch   Assessment:   1. Plantar fasciitis      Plan:  Patient was evaluated and treated and all questions answered.  Plantar Fasciitis -Injection delivered to the plantar fascia of the right foot. -Orthotics cut to length for patient.  Procedure: Injection Tendon/Ligament Consent: Verbal consent obtained. Location: Right plantar fascia at the glabrous junction; medial approach. Skin Prep: Alcohol. Injectate: 1 cc 0.5% marcaine plain, 1 cc dexamethasone phosphate, 0.5 cc kenalog 10. Disposition: Patient tolerated procedure well. Injection site dressed with a band-aid.    No follow-ups on file.

## 2020-05-10 ENCOUNTER — Telehealth: Payer: Self-pay | Admitting: Neurology

## 2020-05-10 NOTE — Telephone Encounter (Signed)
Pt called stating that she has not been able to use her cpap machine and is needing to discuss with RN. Please advise.

## 2020-05-11 NOTE — Telephone Encounter (Signed)
Called the patient back and she stopped using her machine due to recall and then she has found out there is mold in her house. Advised the patient her machine is not under recall, its a resmed machine.  Advised the patient also that as long as she is cleaning her mask/tubing then she should still be able to use the machine. Patient was very concerned about using the machine while mold is not under control in her house despite my advice. Advised the patient that we would push out her appt to allow her time to restart the machine once this is better controlled. Apt made 06/29/2020 at 2:30 with check in 2p

## 2020-06-02 ENCOUNTER — Ambulatory Visit: Payer: Federal, State, Local not specified - PPO | Admitting: Neurology

## 2020-06-14 ENCOUNTER — Ambulatory Visit: Payer: Federal, State, Local not specified - PPO | Admitting: Podiatry

## 2020-06-28 DIAGNOSIS — Z6841 Body Mass Index (BMI) 40.0 and over, adult: Secondary | ICD-10-CM | POA: Diagnosis not present

## 2020-06-28 DIAGNOSIS — Z01419 Encounter for gynecological examination (general) (routine) without abnormal findings: Secondary | ICD-10-CM | POA: Diagnosis not present

## 2020-06-28 DIAGNOSIS — Z1231 Encounter for screening mammogram for malignant neoplasm of breast: Secondary | ICD-10-CM | POA: Diagnosis not present

## 2020-06-29 ENCOUNTER — Ambulatory Visit: Payer: Federal, State, Local not specified - PPO | Admitting: Neurology

## 2020-06-29 ENCOUNTER — Encounter: Payer: Self-pay | Admitting: Neurology

## 2020-06-29 VITALS — BP 143/99 | HR 97 | Ht 62.0 in | Wt 228.0 lb

## 2020-06-29 DIAGNOSIS — Z9989 Dependence on other enabling machines and devices: Secondary | ICD-10-CM | POA: Diagnosis not present

## 2020-06-29 DIAGNOSIS — G4733 Obstructive sleep apnea (adult) (pediatric): Secondary | ICD-10-CM | POA: Diagnosis not present

## 2020-06-29 DIAGNOSIS — R5382 Chronic fatigue, unspecified: Secondary | ICD-10-CM | POA: Diagnosis not present

## 2020-06-29 NOTE — Progress Notes (Signed)
[facility]EP [facility]   Provider:  Melvyn Novas, M D  Primary Care Physician:  Johny Blamer, MD   Referring Provider: Johny Blamer, MD    Chief Complaint  Patient presents with  . Follow-up    pt alone, rm 10. presents as a follow up. she has been unable to use machine due to the fact that she had mold in her home. she states that she had been waiting on replacement parts from the DME. company. she didnt feel washing and cleaning machine with soap and water would be sufficient. D[employer]   RV : 06-29-2020: I have the pleasure of seeing Mrs. Daugherty today, meanwhile 59 year old Caucasian female with a severe hearing impairment.  Her mom ad her daughter have  also been my patients.  She presents to follow-up has been using a CPAP machine in the past but was advised that it is not good for her to use his CPAP machine while she had mold infestation in her home.  She has been waiting on some replacement parts I guess these are filters.  I think she is safe to use his CPAP machine if it was completely cleaned.  She also needs a replacement mask interface and states that she was given a large size when she actually needs a medium or small. I have reviewed the data from August 2021 the last time she had used her CPAP machine and at the time she reached a residual AHI of 2.9/h the pressure at the 95th percentile was 8.9 cmH2O she is using an AutoSet with a serial #[id number] is a minimum pressure of 5 and a maximum pressure of 10 cmH2O and 3 cm EPR.  The machine has been comfortable to use but she needs a new mask.  She has not use the machine now for about 2 months and due to that her Epworth Sleepiness Scale is higher today at 13 out of 24 possible points she is also much more fatigued but endorsed 40 points on the fatigue severity score.  Part of the stressors is not just the mold infestation in her home and the necessary renovation and other people but also that her mom has moved  to hospice.  RV on 08-18-2019 for this long time established patient with severe hearing loss. Mrs. Tristan had discussed in her last visit with me that she may prefer a dental device for the treatment of sleep apnea but the cost of about $800 were too expensive for her.  The patient has a history of degenerative disc disease in the cervical spine, obesity, dysesthesia of multiple sites, intractable migraine and obstructive sleep apnea. She also has TMJ pain which can make a dental device trickier to manufacture.  While she recovered from hip surgery ( 06-12-2019)  she became poorly compliant with CAP as she slept on the sofa-  She is still in PT.   However due to the lack of alternative in the treatment of obstructive sleep apnea we may have to return to see obstructive sleep apnea treatment by CPAP.  The last time the patient used her machine was in October at the time she used it on average for 7 hours 5 minutes, she is using an AutoSet with a minimum pressure of 5 and a maximum pressure of 10 cmH2O with 3 cm EPR and her AHI was 3.6.  There were almost equal central and obstructive apneas present.  She endorsed the Epworth Sleepiness Scale today at 15 which is  a higher number.  She has had a lot of stress currently , her daughter has emancipated herself  from her parents, but she is not even calling her after surgery or for her birthday. There is a boyfriend the parents did not approve of.     I have the pleasure of meeting today with Sarayah L. Fetterolf, a 59 year old longtime established female patient.  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  She has a lifelong hearing deficit.  She has a history of waxing and waning migrainous headaches in the past, those have improved.  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  She also  developed muscle aches and pains and was suspected to have polymyalgia rheumatica but further laboratory tests have not confirmed that.  However she felt much better while on steroids and her pain discomfort and ache returned after the steroids were weaned off.  She will no longer be followed by rheumatology, gave her diclofenac . PA at the orthopedics office filled tizanidine.  She has severe hip pain, and is too heavy for the surgery.   Mrs. Wainright used her CPAP 29 out of 30 days with a compliance of 97% for time with an average of 8 hours and 6 minutes.  CPAP remains set at 7 cm water pressure with 3 cm EPR her 95th percentile pressure is currently at 12.6, her AHI is 4.0 it seems that all apneas are obstructive in nature.  She is using a nasal pillow interface.  I would like to increase the set pressure from 6-10 cmH2O.     03-26-2018;  HASINI PEACHEY is a 59 y.o. female , seen here as in a referral  from her optometrist , Pauline Aus , OD for  "terrible migraine headaches " and " I feel tired all the time". It was her eye doctor that saw her for sharp pains that seem to be located behind the eye, headaches, vision impairment this shadow and overlapping of visual field. All decreased acuity and dry eyes bilaterally. Medication list includes Cymbalta, Excedrin and Motrin.  I reviewed her ophthalmology records she has a corrected vision is 20/50 on the right, 20/30 on the right. She was suspected to develop astigmatism, hyperopia, presbyopia. Nuclear cataract at this time not mature enough for surgical procedure. Headaches which could be either related but the patient cannot have an MRI because of cochlear implants. And she has a small cyst at the temporal fovea.  Chief complaint according to patient : Mrs. Craigo reports that she has had headaches nearly every morning when she wakes up, associated with neck stiffness, nausea and at times vomiting. She is status post anterior cervical fusion  and does have a titanium rod implanted. She has had headaches for a long time but not to this intensity, not of the same quality and not of the same frequency. She often feels as if there is pressure applied on the eyeball and she actually saw ophthalmology, and needs to return for visual field examination. She states that beginning cataracts were suspected. She has TMJ and bruxism. She rarely is feeling well.  She has no energy to clean house, do anything. Takes Excedrin. She had undergone a spinal tap in high school and developed severe headaches, was given compazine - had a seizure at age 16- attributed to the  medication . Lost awareness for 2-3 minutes.  She was diagnosed years ago with sleep apnea, not in our lab, she's not using CPAP.  I have known Mrs. Helmkamp over 10 years as her father's and mother's neurologist and I have also seen her daughter for headaches. 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  Sleep habits are as follows: goes to bed with headaches, but often after having slept on and off all afternoon.  The patient reports that she goes anytime between 11 PM and 2 AM to bed - she does not have an established routine.   Sleep medical history and family sleep history: Her father had advancing Alzheimer's dementia before his death in 76 [address]) . Mother has had syncope, spells, dysphagia, failure to thrive , an TIA.   Social history:  Married, one daughter ( 72) . One son ( 24)  Rare ( one a week ) mountain dew , 2-3 iced tea a week. No tobacco use of any kind, seldomly alcohol -not  more than 2 or 3 times a year.   Interval History following her sleep studies, 03-26-2018. Mrs Mano reports good sleep on CPAP and her husband is happy she doesn't snore. She reports myalgias ,all over soreness, discomfort, weakness - she cannot lift arms above shoulder, standing up from a seated position  causes pain in hamstrings. Climbing stairs is difficult. Proximal myalgia- myositis? She has seen ortho PA, who ordered blood work . She is supposed to have those today at [facility].     DIAGNOSIS  - PSG from PSG on 06/11/17 which showed moderately-severe Obstructive Sleep Apnea (OSA) with overall AHI of 29.4/hr. No significant supine positional accentuation, and snoring was loudest in REM. The Wake baseline 02 saturation was 98%, with the lowest being 82%. Time spent below 89% saturation equaled 12 minutes.Obstructive Sleep Apnea, responding well to only 7 cm water pressure, heated humidity and nasal pillow. The patient was fitted with a ResMed AirFit P10 (Small).Upper Airway Resistance Syndrome.   DISCUSSION:  Melvyn Novas, M.D.   08-07-2017 .       Review of Systems: Out of a complete 14 system review, the patient complains of only the following symptoms, and all other reviewed systems are negative.  She has a cochlear implant and I cannot order MRI> she has not been treated with statins, she has no dermatomal pain,and no rash ( no dermatomyositis)   Headaches improved, now myalgia. Not longer snoring. OSA diasgnosed. Epworth sleepiness Score on CPAP 9 from 13/ 24, the  FSS at  48/ 63  points.     Social History   Socioeconomic History  . Marital status: Married    Spouse name: Not on file  . Number of children: 2  . Years of education: assoc  . Highest education level: Not on file  Occupational History  . Occupation: caretaker    Comment: for parents  Tobacco Use  . Smoking status: Never Smoker  . Smokeless tobacco: Never Used  Vaping Use  . Vaping Use: Never used  Substance and Sexual Activity  . Alcohol use: Yes    Comment: occas  . Drug use: Not Currently  . Sexual activity: Not on file  Other Topics Concern  . Not on file  Social History Narrative  . Not on file   Social Determinants of Health   Financial Resource Strain:   . Difficulty of Paying Living  Expenses: Not on file  Food Insecurity:   . Worried About Programme researcher, broadcasting/film/video in the Last Year: Not on file  . Ran Out of Food in the Last Year: Not on file  Transportation Needs:   . Lack of Transportation (Medical): Not on file  . Lack of Transportation (Non-Medical): Not on file  Physical Activity:   . Days of Exercise per Week: Not on file  . Minutes of Exercise per Session: Not on file  Stress:   . Feeling of Stress : Not on file  Social Connections:   . Frequency of Communication with Friends and Family: Not on file  . Frequency of Social Gatherings with Friends and Family: Not on file  . Attends Religious Services: Not on file  . Active Member of Clubs or Organizations: Not on file  . Attends Banker Meetings: Not on file  . Marital Status: Not on file  Intimate Partner Violence:   . Fear of Current or Ex-Partner: Not on file  . Emotionally Abused: Not on file  . Physically Abused: Not on file  . Sexually Abused: Not on file    Family History  Problem Relation Age of Onset  . Alzheimer's disease Mother   . Alzheimer's disease Father     Past Medical History:  Diagnosis Date  . Asthma    pt denies  . Chronic bronchitis    Dr. Maple Hudson  . Complication of anesthesia    pt stated woke up too soon, crying  . Depression   . Diverticulosis    pt unaware  . Fatty liver   . Hearing impaired   . 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 History of carpal tunnel syndrome   . Hyperlipidemia   . Migraines   . Neuropathy    pt unaware  . OA (osteoarthritis)   . Obesity   . OSA (obstructive sleep apnea)   . Plantar fasciitis, right   . Umbilical hernia     Past Surgical History:  Procedure Laterality Date  . ABDOMINAL HYSTERECTOMY    . CARPAL TUNNEL RELEASE Right   . CESAREAN SECTION     x2  . COCHLEAR IMPLANT Left   . COCHLEAR IMPLANT REVISION Left 01/2016  . COLONOSCOPY    . DENTAL SURGERY    . NECK SURGERY     Rods and screws placed  .  SHOULDER ARTHROSCOPY Right   . TOTAL HIP ARTHROPLASTY Left 06/12/2019   Procedure: TOTAL HIP ARTHROPLASTY ANTERIOR APPROACH;  Surgeon: Jodi Geralds, MD;  Location: [facility];  Service: Orthopedics;  Laterality: Left;  . UPPER GI ENDOSCOPY      Current Outpatient Medications  Medication Sig Dispense Refill  . ALPRAZolam (XANAX) 0.25 MG tablet Take 0.25 mg by mouth daily as needed for anxiety.     . BD PEN NEEDLE NANO 2ND GEN 32G X 4 MM MISC USE WITH VICTOZA**    . Biotin 5000 MCG TABS Take 5,000 mcg by mouth daily.    . DULoxetine (CYMBALTA) 60 MG capsule Take 60 mg by mouth daily.   6  . EPINEPHrine (EPIPEN 2-PAK) 0.3 mg/0.3 mL IJ SOAJ injection Inject 0.3 mLs (0.3 mg total) into the muscle once as needed (for severe allergic reaction). CAll 911 immediately if you have to use this medicine 1 Device 0  . Eszopiclone 3 MG TABS Take 3 mg by mouth at bedtime.    . hydrOXYzine (ATARAX/VISTARIL) 25 MG tablet Take 25 mg  by mouth every 6 (six) hours as needed for itching.     . liraglutide (VICTOZA) 18 MG/3ML SOPN Inject 2 mg into the skin daily.     . phentermine (ADIPEX-P) 37.5 MG tablet Take 37.5 mg by mouth daily.    . traZODone (DESYREL) 50 MG tablet      No current facility-administered medications for this visit.    Allergies as of 06/29/2020 - Review Complete 06/29/2020  Allergen Reaction Noted  . Other Swelling 12/01/2012  . Acetaminophen-pamabrom  01/20/2014  . Compazine  10/26/2011  . Fexofenadine  10/27/2007  . Midol [aspirin-cinnamedrine-caffeine] Swelling 12/01/2012  . Naproxen sodium Swelling     Vitals: BP (!) 143/99   Pulse 97   Ht 5\' 2"  (1.575 m)   Wt 228 lb (103.4 kg)   BMI 41.70 kg/m  Last Weight:  Wt Readings from Last 1 Encounters:  06/29/20 228 lb (103.4 kg)   ACZ:YSAY mass index is 41.7 kg/m.     Last Height:   Ht Readings from Last 1 Encounters:  06/29/20 5\' 2"  (1.575 m)    Physical exam:  General: The patient is awake, alert and appears not in acute  distress. The patient is well groomed. Head: Normocephalic, atraumatic. Neck is supple. Mallampati 3 plus,  neck circumference:17. Nasal airflow -congestion noted  . Retrognathia is seen.  Cardiovascular:  Regular rate and rhythm, without  murmurs or carotid bruit, and without distended neck veins. Respiratory: Lungs are clear to auscultation. Skin:  Without evidence of edema, or rash Trunk: BMI is over 44- further increased over the last 18 month.  The patient's posture is erect  Neurologic exam :The patient is awake and alert, oriented to place and time.   Memory subjective described as intact. Attention span & concentration ability appears normal.  Speech is fluent, without dysarthria, dysphonia or aphasia. Mood and affect are appropriate.  Cranial nerves: Pupils are equal and briskly reactive to light. Extraocular movements  in vertical and horizontal planes intact and without nystagmus. Visual fields by finger perimetry are intact. Hearing impaired but improved with cochlear implant.  Facial sensation intact to fine touch.  Facial motor strength is symmetric , her  tongue and uvula move midline. Shoulder shrug was symmetrical.   Motor exam:  Passive range of motion of the neck as well as active neck motion retroflexion rotation and lateral bending all lead to a audible crackles. Crepitus. Normal tone, muscle bulk and symmetric strength in all extremities.  Sensory:  Gait and station: Patient walks without assistive device and is able unassisted to climb up to the exam table. Strength within normal limits. Stance is stable and normal.  Romberg testing is  negative. Deep tendon reflexes: in the upper and lower extremities are symmetric and intact. Babinski maneuver response is deferred.    Assessment:  After physical and neurologic examination, review of laboratory studies,  Personal review of imaging studies, reports of other /same  Imaging studies, results of polysomnography and / or  neurophysiology testing and pre-existing records as far as provided in visit., my assessment is   1) OSA diagnosed and treated on CPAP - sleep improved . She is less insomnic and less fatigued. 7 hours of average sleep time.  CPAP supplies were ordered. - nasal pillow ResMed AirFit P10 (Small).CPAP was ordered autotitation device widow 5-10 cm water, 3 cm EPR,  optimal at  7 cm water pressure, heated humidity and nasal pillow ResMed AirFit P10 (Small).   2) Obesity -  Has  increased weight on steroids- which have been helping her with pain. Now reached BMI 41.7 kg/m2.  3) hip surgery- still not able to tolerate exercise well- feeling exhausted, SOB.   Obesity: she has seen abdominal surgeon Wenda Low, M.D, who recommended that she could undergo bariatric surgery. Depending on the sleep apnea evaluation results this may still be something she should pursue.    I spent more than 20 minutes of face to face time with the patient.  Greater than 50% of time was spent in counseling and coordination of care. We have discussed the diagnosis and differential and I answered the patient's questions.    Plan:  Treatment plan and additional workup :  For now, going back to CPAP is the only option.  I ordered new supplies for her , specific nasal pillow in Small size.  I will adjust the auto-set from 5-10 cm water pressure with EPR of 3 cm, she was highly compliant until August.   She has voiced again  an interest in bariatric surgery.  She is interested in medical weight and wellness, and [facility]- She should discuss with Dr Tiburcio Pea.  RV in 6 Month .  Melvyn Novas, MD 06/29/2020, 3:04 PM  Certified in Neurology by ABPN Certified in Sleep Medicine by [facility] [address], [address] [place], [place] [address]

## 2020-06-29 NOTE — Patient Instructions (Signed)

## 2020-06-30 DIAGNOSIS — Z23 Encounter for immunization: Secondary | ICD-10-CM | POA: Diagnosis not present

## 2020-07-12 DIAGNOSIS — K080 Exfoliation of teeth due to systemic causes: Secondary | ICD-10-CM | POA: Diagnosis not present

## 2020-07-14 DIAGNOSIS — G4733 Obstructive sleep apnea (adult) (pediatric): Secondary | ICD-10-CM | POA: Diagnosis not present

## 2020-07-19 ENCOUNTER — Ambulatory Visit: Payer: Federal, State, Local not specified - PPO | Admitting: Podiatry

## 2020-07-19 DIAGNOSIS — H40013 Open angle with borderline findings, low risk, bilateral: Secondary | ICD-10-CM | POA: Diagnosis not present

## 2020-07-19 DIAGNOSIS — H35351 Cystoid macular degeneration, right eye: Secondary | ICD-10-CM | POA: Diagnosis not present

## 2020-07-19 DIAGNOSIS — H40033 Anatomical narrow angle, bilateral: Secondary | ICD-10-CM | POA: Diagnosis not present

## 2020-07-19 DIAGNOSIS — H04123 Dry eye syndrome of bilateral lacrimal glands: Secondary | ICD-10-CM | POA: Diagnosis not present

## 2020-07-21 ENCOUNTER — Ambulatory Visit: Payer: Federal, State, Local not specified - PPO | Admitting: Podiatry

## 2020-07-22 DIAGNOSIS — I10 Essential (primary) hypertension: Secondary | ICD-10-CM | POA: Diagnosis not present

## 2020-07-22 DIAGNOSIS — F324 Major depressive disorder, single episode, in partial remission: Secondary | ICD-10-CM | POA: Diagnosis not present

## 2020-07-22 DIAGNOSIS — E782 Mixed hyperlipidemia: Secondary | ICD-10-CM | POA: Diagnosis not present

## 2020-07-22 DIAGNOSIS — F419 Anxiety disorder, unspecified: Secondary | ICD-10-CM | POA: Diagnosis not present

## 2020-08-04 DIAGNOSIS — H903 Sensorineural hearing loss, bilateral: Secondary | ICD-10-CM | POA: Diagnosis not present

## 2020-08-10 ENCOUNTER — Other Ambulatory Visit: Payer: Self-pay

## 2020-08-10 ENCOUNTER — Ambulatory Visit: Payer: Federal, State, Local not specified - PPO | Admitting: Podiatry

## 2020-08-10 ENCOUNTER — Telehealth: Payer: Self-pay | Admitting: Podiatry

## 2020-08-10 DIAGNOSIS — S90121A Contusion of right lesser toe(s) without damage to nail, initial encounter: Secondary | ICD-10-CM | POA: Diagnosis not present

## 2020-08-10 MED ORDER — DOXYCYCLINE HYCLATE 100 MG PO TABS: 100.0000 mg | ORAL_TABLET | Freq: Two times a day (BID) | ORAL | 0 refills | Status: DC

## 2020-08-11 MED ORDER — FLUCONAZOLE 150 MG PO TABS: 150.0000 mg | ORAL_TABLET | ORAL | 0 refills | Status: AC

## 2020-08-12 ENCOUNTER — Encounter: Payer: Self-pay | Admitting: Podiatry

## 2020-08-12 NOTE — Progress Notes (Signed)
Subjective:  Patient ID: Rhonda Simmons, female    DOB: [birth year],  MRN: [id number]  Chief Complaint  Patient presents with  . Toe Pain    Pt stated that her 2nd toe on the right foot she has a throbbing sensation and her toe feels like it has pins and needles in it. She is also concerned about the nail.    59 y.o. female presents with the above complaint.  Patient is here with complaint of right second toe contusion.  Patient states it feels like there is a knot in her toe and there is pins-and-needles associated.  Patient also has throbbing sensation.  It hurts to apply pressure.  Patient states that she cannot use her orthotics and is concerned about the nail.  She states that she uses in regular shoes.  She would like to know what else is going on.  She denies any acute trauma to it.  She may have some component of microtrauma.   Review of Systems: Negative except as noted in the HPI. Denies N/V/F/Ch.  Past Medical History:  Diagnosis Date  . Asthma    pt denies  . Chronic bronchitis    Dr. Maple Hudson  . Complication of anesthesia    pt stated woke up too soon, crying  . Depression   . Diverticulosis    pt unaware  . Fatty liver   . Hearing impaired   . 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 History of carpal tunnel syndrome   . Hyperlipidemia   . Migraines   . Neuropathy    pt unaware  . OA (osteoarthritis)   . Obesity   . OSA (obstructive sleep apnea)   . Plantar fasciitis, right   . Umbilical hernia     Current Outpatient Medications:  .  ALPRAZolam (XANAX) 0.25 MG tablet, Take 0.25 mg by mouth daily as needed for anxiety. , Disp: , Rfl:  .  BD PEN NEEDLE NANO 2ND GEN 32G X 4 MM MISC, USE WITH VICTOZA**, Disp: , Rfl:  .  Biotin 5000 MCG TABS, Take 5,000 mcg by mouth daily., Disp: , Rfl:  .  doxycycline (VIBRA-TABS) 100 MG tablet, Take 1 tablet (100 mg total) by mouth 2 (two) times daily., Disp: 20 tablet, Rfl: 0 .  DULoxetine (CYMBALTA) 60 MG capsule, Take 60  mg by mouth daily. , Disp: , Rfl: 6 .  EPINEPHrine (EPIPEN 2-PAK) 0.3 mg/0.3 mL IJ SOAJ injection, Inject 0.3 mLs (0.3 mg total) into the muscle once as needed (for severe allergic reaction). CAll 911 immediately if you have to use this medicine, Disp: 1 Device, Rfl: 0 .  Eszopiclone 3 MG TABS, Take 3 mg by mouth at bedtime., Disp: , Rfl:  .  fluconazole (DIFLUCAN) 150 MG tablet, Take 1 tablet (150 mg total) by mouth 1 day or 1 dose for 7 doses., Disp: 7 tablet, Rfl: 0 .  hydrOXYzine (ATARAX/VISTARIL) 25 MG tablet, Take 25 mg by mouth every 6 (six) hours as needed for itching. , Disp: , Rfl:  .  liraglutide (VICTOZA) 18 MG/3ML SOPN, Inject 2 mg into the skin daily. , Disp: , Rfl:  .  phentermine (ADIPEX-P) 37.5 MG tablet, Take 37.5 mg by mouth daily., Disp: , Rfl:   Social History   Tobacco Use  Smoking Status Never Smoker  Smokeless Tobacco Never Used    Allergies  Allergen Reactions  . Other Swelling    Tongue and lip swelling Tongue and lip swelling  Tongue and lip swelling   . Acetaminophen-Pamabrom     Other reaction(s): Other (See Comments)  . Compazine     convulsions  . Fexofenadine     D version makes headaches worse  . Midol [Aspirin-Cinnamedrine-Caffeine] Swelling    Tongue and lip swelling  . Naproxen Sodium Swelling    Tongue and lip swelling   Objective:  There were no vitals filed for this visit. There is no height or weight on file to calculate BMI. Constitutional Well developed. Well nourished.  Vascular Dorsalis pedis pulses palpable bilaterally. Posterior tibial pulses palpable bilaterally. Capillary refill normal to all digits.  No cyanosis or clubbing noted. Pedal hair growth normal.  Neurologic Normal speech. Oriented to person, place, and time. Epicritic sensation to light touch grossly present bilaterally.  Dermatologic Nails well groomed and normal in appearance. No open wounds. No skin lesions.  Orthopedic:  Pain on palpation to the right  second digit distal tip.  No signs of damage to the nail.  The nail appears to be well adhered to the underlying nail bed.  There is mild erythema present circumferential around the distal tip of the nail.  No ulceration noted.   Radiographs: None Assessment:  No diagnosis found. Plan:  Patient was evaluated and treated and all questions answered.  Right second digit toe contusion without damage to the nail -I explained to the patient the etiology of contusion various treatment options were discussed.  I believe this might be related to microtrauma with limited space in the shoes.  I will discuss with the patient that she will benefit from surgical shoe and if there is a component of infection involved due to the redness I believe she will benefit from doxycycline as well. -Surgical shoe was dispensed -Doxycycline was dispensed  No follow-ups on file.

## 2020-08-16 ENCOUNTER — Ambulatory Visit: Payer: Federal, State, Local not specified - PPO | Admitting: Podiatry

## 2020-08-19 DIAGNOSIS — H903 Sensorineural hearing loss, bilateral: Secondary | ICD-10-CM | POA: Diagnosis not present

## 2020-08-25 ENCOUNTER — Ambulatory Visit: Payer: Federal, State, Local not specified - PPO | Attending: Obstetrics and Gynecology | Admitting: Physical Therapy

## 2020-08-31 ENCOUNTER — Encounter: Payer: Self-pay | Admitting: Podiatry

## 2020-08-31 ENCOUNTER — Ambulatory Visit: Payer: Federal, State, Local not specified - PPO | Admitting: Podiatry

## 2020-08-31 ENCOUNTER — Other Ambulatory Visit: Payer: Self-pay

## 2020-08-31 DIAGNOSIS — Q666 Other congenital valgus deformities of feet: Secondary | ICD-10-CM | POA: Diagnosis not present

## 2020-08-31 DIAGNOSIS — M79673 Pain in unspecified foot: Secondary | ICD-10-CM | POA: Diagnosis not present

## 2020-08-31 NOTE — Progress Notes (Signed)
Subjective:  Patient ID: Rhonda Simmons, female    DOB: [birth year],  MRN: [id number]  Chief Complaint  Patient presents with  . Foot Pain    Pt stated that she still has a bump on her 2nd toe on the right foot but is not having any pain at this time.    59 y.o. female presents with the above complaint.  Patient presents with a follow-up of left second digit bump.  Patient states she is doing a lot better from the bone.  She has a new complaint of orthotics not functioning well.  She had orthotics made under Dr. Samuella Cota however she states it hurts in the arch.  She has a flatfoot.  She denies any other acute complaints.  She would like to know if there is could be adjustment that could be made.   Review of Systems: Negative except as noted in the HPI. Denies N/V/F/Ch.  Past Medical History:  Diagnosis Date  . Asthma    pt denies  . Chronic bronchitis    Dr. Maple Hudson  . Complication of anesthesia    pt stated woke up too soon, crying  . Depression   . Diverticulosis    pt unaware  . Fatty liver   . Hearing impaired   . 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 History of carpal tunnel syndrome   . Hyperlipidemia   . Migraines   . Neuropathy    pt unaware  . OA (osteoarthritis)   . Obesity   . OSA (obstructive sleep apnea)   . Plantar fasciitis, right   . Umbilical hernia     Current Outpatient Medications:  .  ALPRAZolam (XANAX) 0.25 MG tablet, Take 0.25 mg by mouth daily as needed for anxiety. , Disp: , Rfl:  .  BD PEN NEEDLE NANO 2ND GEN 32G X 4 MM MISC, USE WITH VICTOZA**, Disp: , Rfl:  .  Biotin 5000 MCG TABS, Take 5,000 mcg by mouth daily., Disp: , Rfl:  .  doxycycline (VIBRA-TABS) 100 MG tablet, Take 1 tablet (100 mg total) by mouth 2 (two) times daily., Disp: 20 tablet, Rfl: 0 .  DULoxetine (CYMBALTA) 60 MG capsule, Take 60 mg by mouth daily. , Disp: , Rfl: 6 .  EPINEPHrine (EPIPEN 2-PAK) 0.3 mg/0.3 mL IJ SOAJ injection, Inject 0.3 mLs (0.3 mg total) into the muscle  once as needed (for severe allergic reaction). CAll 911 immediately if you have to use this medicine, Disp: 1 Device, Rfl: 0 .  Eszopiclone 3 MG TABS, Take 3 mg by mouth at bedtime., Disp: , Rfl:  .  hydrOXYzine (ATARAX/VISTARIL) 25 MG tablet, Take 25 mg by mouth every 6 (six) hours as needed for itching. , Disp: , Rfl:  .  liraglutide (VICTOZA) 18 MG/3ML SOPN, Inject 2 mg into the skin daily. , Disp: , Rfl:  .  phentermine (ADIPEX-P) 37.5 MG tablet, Take 37.5 mg by mouth daily., Disp: , Rfl:   Social History   Tobacco Use  Smoking Status Never Smoker  Smokeless Tobacco Never Used    Allergies  Allergen Reactions  . Other Swelling    Tongue and lip swelling Tongue and lip swelling Tongue and lip swelling   . Acetaminophen-Pamabrom     Other reaction(s): Other (See Comments)  . Compazine     convulsions  . Fexofenadine     D version makes headaches worse  . Midol [Aspirin-Cinnamedrine-Caffeine] Swelling    Tongue and lip swelling  .  Naproxen Sodium Swelling    Tongue and lip swelling   Objective:  There were no vitals filed for this visit. There is no height or weight on file to calculate BMI. Constitutional Well developed. Well nourished.  Vascular Dorsalis pedis pulses palpable bilaterally. Posterior tibial pulses palpable bilaterally. Capillary refill normal to all digits.  No cyanosis or clubbing noted. Pedal hair growth normal.  Neurologic Normal speech. Oriented to person, place, and time. Epicritic sensation to light touch grossly present bilaterally.  Dermatologic Nails well groomed and normal in appearance. No open wounds. No skin lesions.  Orthopedic:  Mild pain on palpation to the arch of the foot.  No pain at the calcaneal tuber.  No other signs of plantar fasciitis noted.  No metatarsalgia noted.   Radiographs: None Assessment:   1. Pes planovalgus   2. Arch pain, unspecified laterality    Plan:  Patient was evaluated and treated and all questions  answered.  Bilateral arch pain secondary to pes planovalgus with -I explained to patient the etiology of arch pain and various treatment options were discussed.  Given that patient has more of a rigid pes planovalgus deformity I believe she would benefit from modification of orthotics that there seems to be a little bit of a buildup into the arch.  I will have her follow-up with Rhonda Simmons for modification into the arch bilaterally.  No follow-ups on file.

## 2020-09-05 ENCOUNTER — Other Ambulatory Visit: Payer: Federal, State, Local not specified - PPO | Admitting: Orthotics

## 2020-09-06 DIAGNOSIS — M545 Low back pain, unspecified: Secondary | ICD-10-CM | POA: Diagnosis not present

## 2020-09-22 ENCOUNTER — Telehealth: Payer: Self-pay | Admitting: Neurology

## 2020-09-22 DIAGNOSIS — M545 Low back pain, unspecified: Secondary | ICD-10-CM | POA: Diagnosis not present

## 2020-09-22 DIAGNOSIS — U071 COVID-19: Secondary | ICD-10-CM | POA: Diagnosis not present

## 2020-09-22 NOTE — Telephone Encounter (Signed)
Pt. states her CPAP machine is not blowing enough air. She needs her settings changed. Please advise.

## 2020-09-22 NOTE — Telephone Encounter (Signed)
Had Dr Vickey Huger review her download and she agrees to increase the maximum pressure to 12 cm of water pressure. I have made the patient aware of the pressure change and I will also update the company that the pressure change has been made.  Patient verbalized understanding and was appreciative for the call back

## 2020-10-20 ENCOUNTER — Other Ambulatory Visit: Payer: Self-pay

## 2020-10-20 ENCOUNTER — Ambulatory Visit: Payer: Federal, State, Local not specified - PPO | Attending: Obstetrics and Gynecology | Admitting: Physical Therapy

## 2020-10-20 ENCOUNTER — Encounter: Payer: Self-pay | Admitting: Physical Therapy

## 2020-10-20 DIAGNOSIS — R279 Unspecified lack of coordination: Secondary | ICD-10-CM | POA: Insufficient documentation

## 2020-10-20 DIAGNOSIS — M6281 Muscle weakness (generalized): Secondary | ICD-10-CM | POA: Insufficient documentation

## 2020-10-21 NOTE — Therapy (Addendum)
[facility] [address] [place], [place], [address] Phone: [phone]   Fax:  [phone]  Physical Therapy Evaluation  Patient Details  Name: Rhonda Simmons MRN: [id number] Date of Birth: [birth year] Referring Provider (PT): Marcelle Overlie, MD   Encounter Date: 10/20/2020   PT End of Session - 10/23/20 1327    Visit Number 1    Date for PT Re-Evaluation 01/12/21    PT Start Time 1400    PT Stop Time 1445    PT Time Calculation (min) 45 min    Activity Tolerance Patient tolerated treatment well    Behavior During Therapy [facility] for tasks assessed/performed           Past Medical History:  Diagnosis Date  . Asthma    pt denies  . Chronic bronchitis    Dr. Maple Hudson  . Complication of anesthesia    pt stated woke up too soon, crying  . Depression   . Diverticulosis    pt unaware  . Fatty liver   . Hearing impaired   . 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 History of carpal tunnel syndrome   . Hyperlipidemia   . Migraines   . Neuropathy    pt unaware  . OA (osteoarthritis)   . Obesity   . OSA (obstructive sleep apnea)   . Plantar fasciitis, right   . Umbilical hernia     Past Surgical History:  Procedure Laterality Date  . ABDOMINAL HYSTERECTOMY    . CARPAL TUNNEL RELEASE Right   . CESAREAN SECTION     x2  . COCHLEAR IMPLANT Left   . COCHLEAR IMPLANT REVISION Left 01/2016  . COLONOSCOPY    . DENTAL SURGERY    . NECK SURGERY     Rods and screws placed  . SHOULDER ARTHROSCOPY Right   . TOTAL HIP ARTHROPLASTY Left 06/12/2019   Procedure: TOTAL HIP ARTHROPLASTY ANTERIOR APPROACH;  Surgeon: Jodi Geralds, MD;  Location: [facility];  Service: Orthopedics;  Laterality: Left;  . UPPER GI ENDOSCOPY      There were no vitals filed for this visit.    Subjective Assessment - 10/20/20 1408    Subjective Pt states she went to pelvic PT but the co-pay at the other place was too high.  Pt reports she  has been having OAB. Pt had tear of bladder when she had the hysterectomy and reports her bladder wall is on the thin side.  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 Pt states leakage mostly with strong urge and sometimes with coughing or sneezing    Patient Stated Goals not have leakage and urgency; L THA, hysterectomy, c-section, Rt plantarfasaciitis, umbilical hernia    Currently in Pain? Yes    Pain Score 9     Pain Location Abdomen    Pain Orientation Lower    Pain Descriptors / Indicators Sharp    Pain Radiating Towards have numbness and tingling down my legs    Pain Onset More than a month ago    Pain Frequency Intermittent    Aggravating Factors  not sure - pain can happen when on my stomach and sneezing or coughing    Pain Relieving Factors stop and breath, for legs getting off my feet usually helps              [facility] PT Assessment - 10/23/20 0001      Assessment   Medical Diagnosis R10.2 (ICD-10-CM) -  Pelvic and perineal pain    Referring Provider (PT) Marcelle Overlie, MD    Onset Date/Surgical Date --   leakage has gone on for years   Prior Therapy Yes 2 visits at a different facility      Precautions   Precautions None      Restrictions   Weight Bearing Restrictions No      Balance Screen   Has the patient fallen in the past 6 months No      Home Environment   Living Environment Private residence      Prior Function   Level of Independence Independent      Cognition   Overall Cognitive Status Within Functional Limits for tasks assessed      Posture/Postural Control   Posture/Postural Control Postural limitations    Postural Limitations Rounded Shoulders;Anterior pelvic tilt;Flexed trunk      ROM / Strength   AROM / PROM / Strength AROM;PROM;Strength      AROM   Overall AROM Comments 50% lumbar flexion      PROM   Overall PROM Comments hip 75% rotatoin and flexion      Strength   Overall Strength Comments 4/5 Lt hip      Flexibility   Soft Tissue  Assessment /Muscle Length yes    Hamstrings 75% bilateral      Ambulation/Gait   Gait Pattern Within Functional Limits                      Objective measurements completed on examination: See above findings.     Pelvic Floor Special Questions - 10/23/20 0001    Are you Pregnant or attempting pregnancy? No    Prior Pregnancies Yes    Number of Pregnancies 2    Number of C-Sections 2    Currently Sexually Active Yes    Is this Painful No    Urinary Leakage Yes    How often depends    Activities that cause leaking With strong urge;Coughing;Sneezing    Urinary urgency Yes    Fecal incontinence No    Falling out feeling (prolapse) No    Pelvic Floor Internal Exam pt identity confirmed and internal soft tissue assessment performed    Exam Type Vaginal    Strength weak squeeze, no lift    Strength # of reps 2    Strength # of seconds 2    Tone high                      PT Short Term Goals - 10/23/20 1331      PT SHORT TERM GOAL #1   Title Pt will be ind with initial HEP    Time 4    Period Weeks    Status New             PT Long Term Goals - 10/23/20 1258      PT LONG TERM GOAL #1   Title Pt will report at least 60% less pain    Time 12    Period Weeks    Status New    Target Date 01/12/21      PT LONG TERM GOAL #2   Title Pt will have pelvic floor strength of 3/5 and be able to sustain for at least 10 seconds for improved bladder control    Time 12    Period Weeks    Status New    Target Date 01/12/21  PT LONG TERM GOAL #3   Title Pt will be ind with advanced HEP    Time 12    Period Weeks    Status New    Target Date 01/12/21      PT LONG TERM GOAL #4   Title Pt will report no leakage with coughing or sneezing    Time 12    Period Weeks    Status New    Target Date 01/12/21                  Plan - 10/23/20 1132    Clinical Impression Statement Pt presents to skilled PT due to leakage and abdominal pain.   Pt has fascial restrictions in abdomen due to multiple surgeries including 2 c-sections.  Pt has pelvic floor weakness of 2/5 and holding for 2 seconds.  She has slow time to relax after contractingand only does 2 contractions in 10 seconds.  Pt has abdominal weakness and decreased coordination with breathing.  Pt has posture deficits as mentioned above.  Pt will benefit from skilled PT to address these impairments and imrpove overall function due to pain management and bladder control.    Personal Factors and Comorbidities Comorbidity 3+    Comorbidities L THA, hysterectomy, 2 c-section, Rt plantarfasaciitis, umbilical hernia    Examination-Activity Limitations Continence;Toileting;Sleep    Stability/Clinical Decision Making Evolving/Moderate complexity    Clinical Decision Making Moderate    Rehab Potential Excellent    PT Frequency 1x / week    PT Duration 12 weeks    PT Treatment/Interventions ADLs/Self Care Home Management;Biofeedback;Moist Heat;Cryotherapy;Electrical Stimulation;Therapeutic activities;Therapeutic exercise;Neuromuscular re-education;Patient/family education;Manual techniques;Taping;Dry needling;Passive range of motion    PT Next Visit Plan abdominal massage and breathing with knack    Consulted and Agree with Plan of Care Patient           Patient will benefit from skilled therapeutic intervention in order to improve the following deficits and impairments:  Pain,Postural dysfunction,Impaired flexibility,Increased fascial restricitons,Decreased strength,Decreased coordination,Decreased range of motion,Impaired tone,Increased muscle spasms  Visit Diagnosis: Muscle weakness (generalized)  Unspecified lack of coordination     Problem List Patient Active Problem List   Diagnosis Date Noted  . Chronic fatigue 06/29/2020  . Arthritis 04/26/2020  . Depression 04/26/2020  . High cholesterol 04/26/2020  . History of cochlear implant 02/12/2020  . Swelling of left  parotid gland 02/12/2020  . S/P total hip arthroplasty 06/14/2019  . Primary osteoarthritis of left hip 06/12/2019  . OSA on CPAP 10/16/2018  . Myalgia 04/02/2018  . Myopathy 04/02/2018  . Neck stiffness 04/02/2018  . Pain in joint, multiple sites 04/02/2018  . Retro-orbital pain, bilateral 03/18/2017  . Blurred vision, bilateral 03/18/2017  . OSA (obstructive sleep apnea) 03/18/2017  . Super obese 03/18/2017  . Referred ear pain 06/08/2016  . Mononeuritis 03/10/2014  . Dysesthesia of multiple sites 02/03/2013  . Skin sensation disturbance 02/03/2013  . Neuropathy   . Hearing loss of both ears   . LEG PAIN, RIGHT 05/06/2008  . EUSTACHIAN TUBE DYSFUNCTION 01/10/2008  . HEARING IMPAIRMENT 10/27/2007  . ALLERGIC RHINITIS 10/27/2007  . ASTHMA 10/27/2007  . INSOMNIA 10/27/2007  . HEADACHE 10/27/2007  . DYSPHAGIA UNSPECIFIED 10/27/2007  . ANGIOEDEMA 10/27/2007  . Dysphagia 10/27/2007    Junious Silk, PT 10/23/2020, 2:37 PM  [facility] [address] [place], [place], [address] Phone: [phone]   Fax:  [phone]  Name: Rhonda Simmons MRN: [id number] Date of Birth:  08/23/1961  

## 2020-10-23 NOTE — Addendum Note (Signed)
Addended by: Beatris Si on: 10/23/2020 02:38 PM   Modules accepted: Orders

## 2020-11-08 DIAGNOSIS — R8290 Unspecified abnormal findings in urine: Secondary | ICD-10-CM | POA: Diagnosis not present

## 2020-12-02 DIAGNOSIS — J0100 Acute maxillary sinusitis, unspecified: Secondary | ICD-10-CM | POA: Diagnosis not present

## 2020-12-27 ENCOUNTER — Telehealth: Payer: Self-pay | Admitting: Neurology

## 2020-12-27 DIAGNOSIS — H04123 Dry eye syndrome of bilateral lacrimal glands: Secondary | ICD-10-CM | POA: Diagnosis not present

## 2020-12-27 DIAGNOSIS — H40013 Open angle with borderline findings, low risk, bilateral: Secondary | ICD-10-CM | POA: Diagnosis not present

## 2020-12-27 DIAGNOSIS — H40033 Anatomical narrow angle, bilateral: Secondary | ICD-10-CM | POA: Diagnosis not present

## 2020-12-27 DIAGNOSIS — R519 Headache, unspecified: Secondary | ICD-10-CM | POA: Diagnosis not present

## 2020-12-27 NOTE — Telephone Encounter (Signed)
LVM for patient to call back to reschedule and let her know the appointment tomorrow is cancelled.

## 2020-12-27 NOTE — Telephone Encounter (Signed)
Called the patient to advise Dr. Vickey Huger will be out of the office tomorrow afternoon.  Patient can be seen by nurse practitioner.  Please reschedule appointment if patient calls back.

## 2020-12-28 ENCOUNTER — Ambulatory Visit: Payer: Federal, State, Local not specified - PPO | Admitting: Neurology

## 2021-01-09 DIAGNOSIS — H40033 Anatomical narrow angle, bilateral: Secondary | ICD-10-CM | POA: Diagnosis not present

## 2021-01-09 DIAGNOSIS — H40013 Open angle with borderline findings, low risk, bilateral: Secondary | ICD-10-CM | POA: Diagnosis not present

## 2021-01-09 DIAGNOSIS — H04123 Dry eye syndrome of bilateral lacrimal glands: Secondary | ICD-10-CM | POA: Diagnosis not present

## 2021-01-11 DIAGNOSIS — R1013 Epigastric pain: Secondary | ICD-10-CM | POA: Diagnosis not present

## 2021-01-23 DIAGNOSIS — H40033 Anatomical narrow angle, bilateral: Secondary | ICD-10-CM | POA: Diagnosis not present

## 2021-02-06 DIAGNOSIS — H40032 Anatomical narrow angle, left eye: Secondary | ICD-10-CM | POA: Diagnosis not present

## 2021-02-06 DIAGNOSIS — H40033 Anatomical narrow angle, bilateral: Secondary | ICD-10-CM | POA: Diagnosis not present

## 2021-02-07 DIAGNOSIS — D72828 Other elevated white blood cell count: Secondary | ICD-10-CM | POA: Diagnosis not present

## 2021-02-17 DIAGNOSIS — H5713 Ocular pain, bilateral: Secondary | ICD-10-CM | POA: Diagnosis not present

## 2021-02-17 DIAGNOSIS — H40013 Open angle with borderline findings, low risk, bilateral: Secondary | ICD-10-CM | POA: Diagnosis not present

## 2021-02-17 DIAGNOSIS — R519 Headache, unspecified: Secondary | ICD-10-CM | POA: Diagnosis not present

## 2021-02-17 DIAGNOSIS — H40033 Anatomical narrow angle, bilateral: Secondary | ICD-10-CM | POA: Diagnosis not present

## 2021-03-03 DIAGNOSIS — Z713 Dietary counseling and surveillance: Secondary | ICD-10-CM | POA: Diagnosis not present

## 2021-03-03 DIAGNOSIS — Z6841 Body Mass Index (BMI) 40.0 and over, adult: Secondary | ICD-10-CM | POA: Diagnosis not present

## 2021-03-03 DIAGNOSIS — R634 Abnormal weight loss: Secondary | ICD-10-CM | POA: Diagnosis not present

## 2021-03-08 ENCOUNTER — Encounter: Payer: Self-pay | Admitting: Neurology

## 2021-03-09 ENCOUNTER — Other Ambulatory Visit: Payer: Self-pay

## 2021-03-09 ENCOUNTER — Encounter: Payer: Self-pay | Admitting: Neurology

## 2021-03-09 ENCOUNTER — Ambulatory Visit: Payer: Federal, State, Local not specified - PPO | Admitting: Neurology

## 2021-03-09 VITALS — BP 135/90 | HR 108 | Ht 62.0 in | Wt 228.0 lb

## 2021-03-09 DIAGNOSIS — Z9621 Cochlear implant status: Secondary | ICD-10-CM | POA: Diagnosis not present

## 2021-03-09 DIAGNOSIS — H903 Sensorineural hearing loss, bilateral: Secondary | ICD-10-CM | POA: Diagnosis not present

## 2021-03-09 DIAGNOSIS — Z9989 Dependence on other enabling machines and devices: Secondary | ICD-10-CM

## 2021-03-09 NOTE — Progress Notes (Signed)
S[facility]EP [facility]   Provider:  Melvyn Novas, M D  Primary Care Physician:  Johny Blamer, MD   Referring Provider: Johny Blamer, MD    Chief Complaint  Patient presents with   Follow-up    RM 10, alone. Has not been able to be compliant with cpap. Has cochlear impant and mask/band gets in the way of this. It causes her pain. She is wondering if there is an alternative option.    RV 03-09-2021: Rv for Maureen Ralphs.  Tarhonda has not been able to use her CPAP since May because she has been a cochlear implant and the device and the paired headgear are actually irritating 1 another.  So she has been unable to tolerate the interface.  I think we probably could do the very best by using a other dental device or a DreamWear with a different halo.  These may not push directly at the area of the temple. Unfortunately, she has not contacted DME- and now would be deemed non compliant. Her BMI would exclude Inspire, and she wonders about a dental device.  She is interested in bariatric surgery, sleeve.    RV : 06-29-2020: I have the pleasure of seeing Mrs. Daugherty today, meanwhile 60 year old Caucasian female with a severe hearing impairment.  Her mom ad her daughter have  also been my patients.  She presents to follow-up has been using a CPAP machine in the past but was advised that it is not good for her to use his CPAP machine while she had mold infestation in her home.  She has been waiting on some replacement parts I guess these are filters.  I think she is safe to use his CPAP machine if it was completely cleaned.  She also needs a replacement mask interface and states that she was given a large size when she actually needs a medium or small. I have reviewed the data from August 2021 the last time she had used her CPAP machine and at the time she reached a residual AHI of 2.9/h the pressure at the 95th percentile was 8.9 cmH2O she is using an AutoSet with a serial #[id number] is a  minimum pressure of 5 and a maximum pressure of 10 cmH2O and 3 cm EPR.  The machine has been comfortable to use but she needs a new mask.  She has not use the machine now for about 2 months and due to that her Epworth Sleepiness Scale is higher today at 13 out of 24 possible points she is also much more fatigued but endorsed 40 points on the fatigue severity score.  Part of the stressors is not just the mold infestation in her home and the necessary renovation and other people but also that her mom has moved to hospice.  RV on 08-18-2019 for this long time established patient with severe hearing loss. Mrs. Janoski had discussed in her last visit with me that she may prefer a dental device for the treatment of sleep apnea but the cost of about $800 were too expensive for her.  The patient has a history of degenerative disc disease in the cervical spine, obesity, dysesthesia of multiple sites, intractable migraine and obstructive sleep apnea. She also has TMJ pain which can make a dental device trickier to manufacture.  While she recovered from hip surgery ( 06-12-2019)  she became poorly compliant with CAP as she slept on the sofa-  She is still in PT.   However due to the  lack of alternative in the treatment of obstructive sleep apnea we may have to return to see obstructive sleep apnea treatment by CPAP.  The last time the patient used her machine was in October at the time she used it on average for 7 hours 5 minutes, she is using an AutoSet with a minimum pressure of 5 and a maximum pressure of 10 cmH2O with 3 cm EPR and her AHI was 3.6.  There were almost equal central and obstructive apneas present.  She endorsed the Epworth Sleepiness Scale today at 15 which is a higher number.  She has had a lot of stress currently , her daughter has emancipated herself  from her parents, but she is not even calling her after surgery or for her birthday. There is a boyfriend the parents did not approve of.     I  have the pleasure of meeting today with Antonella L. Marut, a 60 year old longtime established female patient.  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  She has a lifelong hearing deficit.  She has a history of waxing and waning migrainous headaches in the past, those have improved.  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  She also developed muscle aches and pains and was suspected to have polymyalgia rheumatica but further laboratory tests have not confirmed that.  However she felt much better while on steroids and her pain discomfort and ache returned after the steroids were weaned off.  She will no longer be followed by rheumatology, gave her diclofenac . PA at the orthopedics office filled tizanidine.  She has severe hip pain, and is too heavy for the surgery.   Mrs. Neighbor used her CPAP 29 out of 30 days with a compliance of 97% for time with an average of 8 hours and 6 minutes.  CPAP remains set at 7 cm water pressure with 3 cm EPR her 95th percentile pressure is currently at 12.6, her AHI is 4.0 it seems that all apneas are obstructive in nature.  She is using a nasal pillow interface.  I would like to increase the set pressure from 6-10 cmH2O.     03-26-2018;  NYESHIA TAVANI is a 60 y.o. female , seen here as in a referral  from her optometrist , Pauline Aus , OD for  "terrible migraine headaches " and " I feel tired all the time". It was her eye doctor that saw her for sharp pains that seem to be located behind the eye, headaches, vision impairment this shadow and overlapping of visual field. All decreased acuity and dry eyes bilaterally. Medication list includes Cymbalta, Excedrin and Motrin.  I reviewed her ophthalmology records she has a corrected vision is 20/50 on the right, 20/30 on the right. She was  suspected to develop astigmatism, hyperopia, presbyopia. Nuclear cataract at this time not mature enough for surgical procedure. Headaches which could be either related but the patient cannot have an MRI because of cochlear implants. And she has a small cyst at the temporal fovea.  Chief complaint according to patient : Mrs. Hirakawa reports that she has had headaches nearly every morning when she wakes up, associated with neck stiffness, nausea and at times vomiting. She is status post anterior cervical fusion and does have a titanium rod implanted. She has had headaches for a long  time but not to this intensity, not of the same quality and not of the same frequency. She often feels as if there is pressure applied on the eyeball and she actually saw ophthalmology, and needs to return for visual field examination. She states that beginning cataracts were suspected. She has TMJ and bruxism. She rarely is feeling well.  She has no energy to clean house, do anything. Takes Excedrin. She had undergone a spinal tap in high school and developed severe headaches, was given compazine - had a seizure at age 46- attributed to the medication . Lost awareness for 2-3 minutes.  She was diagnosed years ago with sleep apnea, not in our lab, she's not using CPAP.  I have known Mrs. Foskett over 10 years as her father's and mother's neurologist and I have also seen her daughter for headaches. 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  Sleep habits are as follows: goes to bed with headaches, but often after having slept on and off all afternoon.  The patient reports that she goes anytime between 11 PM and 2 AM to bed - she does not have an established routine.   Sleep medical history and family sleep history: Her father had advancing Alzheimer's dementia before his death in 55 Duffy Rhody) . Mother has had syncope, spells,  dysphagia, failure to thrive , an TIA.   Social history:  Married, one daughter ( 75) . One son ( 24)  Rare ( one a week ) mountain dew , 2-3 iced tea a week. No tobacco use of any kind, seldomly alcohol -not  more than 2 or 3 times a year.   Interval History following her sleep studies, 03-26-2018. Mrs Leveille reports good sleep on CPAP and her husband is happy she doesn't snore. She reports myalgias ,all over soreness, discomfort, weakness - she cannot lift arms above shoulder, standing up from a seated position causes pain in hamstrings. Climbing stairs is difficult. Proximal myalgia- myositis? She has seen ortho PA, who ordered blood work . She is supposed to have those today at [facility].     DIAGNOSIS  - PSG from PSG on 06/11/17 which showed moderately-severe Obstructive Sleep Apnea (OSA) with overall AHI of 29.4/hr. No significant supine positional accentuation, and snoring was loudest in REM. The Wake baseline 02 saturation was 98%, with the lowest being 82%. Time spent below 89% saturation equaled 12 minutes.Obstructive Sleep Apnea, responding well to only 7 cm water pressure, heated humidity and nasal pillow. The patient was fitted with a ResMed AirFit P10 (Small).Upper Airway Resistance Syndrome.   DISCUSSION:  Melvyn Novas, M.D.   08-07-2017 .       Review of Systems: Out of a complete 14 system review, the patient complains of only the following symptoms, and all other reviewed systems are negative.  She has a cochlear implant and I cannot order MRI> she has not been treated with statins, she has no dermatomal pain,and no rash ( no dermatomyositis)   Headaches improved, now myalgia. Not longer snoring. OSA diasgnosed.  Unfortunately after CPAP worked so well for the patient we now struggle with the headgear irritating the area over the cochlear implant.  I will also DME to refit her, her compliance here was not only 19% since these problems arose and she stopped CPAP.  Her last  AHI was 2.0 which is a good resolution and her 95th percentile pressure was 8.7  cmH2O she has an AutoSet with a setting of 5 through 12 cm water pressure and 3 cm EPR and when she used CPAP on average she used it for 6 hours and 40 minutes at night.  And    Epworth sleepiness Score on CPAP was 9 fnow is 15 again - off CPAP, FSS: 58/63 pints.     Social History   Socioeconomic History   Marital status: Married    Spouse name: Not on file   Number of children: 2   Years of education: assoc   Highest education level: Not on file  Occupational History   Occupation: caretaker    Comment: for parents  Tobacco Use   Smoking status: Never   Smokeless tobacco: Never  Vaping Use   Vaping Use: Never used  Substance and Sexual Activity   Alcohol use: Yes    Comment: occas   Drug use: Not Currently   Sexual activity: Not on file  Other Topics Concern   Not on file  Social History Narrative   Not on file   Social Determinants of Health   Financial Resource Strain: Not on file  Food Insecurity: Not on file  Transportation Needs: Not on file  Physical Activity: Not on file  Stress: Not on file  Social Connections: Not on file  Intimate Partner Violence: Not on file    Family History  Problem Relation Age of Onset   Alzheimer's disease Mother    Alzheimer's disease Father     Past Medical History:  Diagnosis Date   Asthma    pt denies   Chronic bronchitis    Dr. 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   History of carpal tunnel syndrome    Hyperlipidemia    Migraines    Neuropathy    pt unaware   OA (osteoarthritis)    Obesity    OSA (obstructive sleep apnea)    Plantar fasciitis, right    Umbilical hernia     Past Surgical History:  Procedure Laterality Date   ABDOMINAL HYSTERECTOMY     CARPAL TUNNEL  RELEASE Right    CESAREAN SECTION     x2   COCHLEAR IMPLANT Left    COCHLEAR IMPLANT REVISION Left 01/2016   COLONOSCOPY     DENTAL SURGERY     NECK SURGERY     Rods and screws placed   SHOULDER ARTHROSCOPY Right    TOTAL HIP ARTHROPLASTY Left 06/12/2019   Procedure: TOTAL HIP ARTHROPLASTY ANTERIOR APPROACH;  Surgeon: Jodi Geralds, MD;  Location: [facility];  Service: Orthopedics;  Laterality: Left;   UPPER GI ENDOSCOPY      Current Outpatient Medications  Medication Sig Dispense Refill   ALPRAZolam (XANAX) 0.25 MG tablet Take 0.25 mg by mouth daily as needed for anxiety.      amLODipine (NORVASC) 5 MG tablet Take 5 mg by mouth daily.     BD PEN NEEDLE NANO 2ND GEN 32G X 4 MM MISC USE WITH VICTOZA**     Biotin 5000 MCG TABS Take 5,000 mcg by mouth daily.     DULoxetine (CYMBALTA) 60 MG capsule Take 60 mg by mouth daily.   6   EPINEPHrine (EPIPEN 2-PAK) 0.3 mg/0.3 mL IJ SOAJ injection Inject  0.3 mLs (0.3 mg total) into the muscle once as needed (for severe allergic reaction). CAll 911 immediately if you have to use this medicine 1 Device 0   Eszopiclone 3 MG TABS Take 3 mg by mouth at bedtime.     hydrOXYzine (ATARAX/VISTARIL) 25 MG tablet Take 25 mg by mouth every 6 (six) hours as needed for itching.      phentermine (ADIPEX-P) 37.5 MG tablet Take 37.5 mg by mouth daily.     Semaglutide,0.25 or 0.5MG /DOS, (OZEMPIC, 0.25 OR 0.5 MG/DOSE,) 2 MG/1.5ML SOPN Inject 0.5 mg into the skin once a week.     No current facility-administered medications for this visit.    Allergies as of 03/09/2021 - Review Complete 03/09/2021  Allergen Reaction Noted   Other Swelling 12/01/2012   Acetaminophen-pamabrom  01/20/2014   Compazine  10/26/2011   Fexofenadine  10/27/2007   Midol [aspirin-cinnamedrine-caffeine] Swelling 12/01/2012   Naproxen sodium Swelling     Vitals: BP 135/90 (BP Location: Left Arm, Patient Position: Sitting, Cuff Size: Normal)   Pulse (!) 108   Ht  (1.575 m)   Wt 228 lb  (103.4 kg)   BMI 41.70 kg/m  Last Weight:  Wt Readings from Last 1 Encounters:  03/09/21 228 lb (103.4 kg)   ZOX:WRUE mass index is 41.7 kg/m.     Last Height:   Ht Readings from Last 1 Encounters:  03/09/21  (1.575 m)    Physical exam:  General: The patient is awake, alert and appears not in acute distress. The patient is well groomed. Head: Normocephalic, atraumatic. Neck is supple. Mallampati 3 plus,  neck circumference:17. Nasal airflow -congestion noted  . Retrognathia is seen.  Cardiovascular:  Regular rate and rhythm, without  murmurs or carotid bruit, and without distended neck veins. Respiratory: Lungs are clear to auscultation. Skin:  Without evidence of edema, or rash Trunk: BMI is over 44- further increased over the last 18 month.  The patient's posture is erect  Neurologic exam :The patient is awake and alert, oriented to place and time.   Memory subjective described as intact. Attention span & concentration ability appears normal.  Speech is fluent, without dysarthria, dysphonia or aphasia. Mood and affect are appropriate.  Cranial nerves: Pupils are equal and briskly reactive to light. Extraocular movements  in vertical and horizontal planes intact and without nystagmus. Visual fields by finger perimetry are intact. Hearing impaired but improved with cochlear implant.  Facial sensation intact to fine touch.  Facial motor strength is symmetric , her  tongue and uvula move midline. Shoulder shrug was symmetrical.   Motor exam:  Passive range of motion of the neck as well as active neck motion retroflexion rotation and lateral bending all lead to a audible crackles. Crepitus. Normal tone, muscle bulk and symmetric strength in all extremities.  Sensory:  Gait and station: Patient walks without assistive device and is able unassisted to climb up to the exam table. Strength within normal limits. Stance is stable and normal.  Romberg testing is  negative. Deep tendon  reflexes: in the upper and lower extremities are symmetric and intact. Babinski maneuver response is deferred.    Assessment:  After physical and neurologic examination, review of laboratory studies,  Personal review of imaging studies, reports of other /same  Imaging studies, results of polysomnography and / or neurophysiology testing and pre-existing records as far as provided in visit., my assessment is   1) OSA diagnosed and treated on CPAP - sleep improved .  She is less insomnic and less fatigued. 7 hours of average sleep time.  CPAP supplies were ordered. - nasal pillow ResMed AirFit P10 (Small).CPAP was ordered autotitation device widow 5-10 cm water, 3 cm EPR,  optimal at  7 cm water pressure, heated humidity and nasal pillow ResMed AirFit P10 (Small).   2) Obesity -  Has increased weight on steroids- which have been helping her with pain. Now reached BMI 41.7 kg/m2.  3) hip surgery- still not able to tolerate exercise well- feeling exhausted, SOB.   Obesity: she has seen abdominal surgeon Wenda Low, M.D, who recommended that she could undergo bariatric surgery. Depending on the sleep apnea evaluation results this may still be something she should pursue.   Unfortunately after CPAP worked so well for the patient we now struggle with the headgear irritating the area over the cochlear implant.  I will also DME to refit her, her compliance here was not only 19% since these problems arose and she stopped CPAP.  Her last AHI was 2.0 which is a good resolution and her 95th percentile pressure was 8.7 cmH2O she has an AutoSet with a setting of 5 through 12 cm water pressure and 3 cm EPR and when she used CPAP on average she used it for 6 hours and 40 minutes at night.    Epworth sleepiness Score on CPAP was 9 , now is 15 again - off CPAP, FSS: 58/63 pints.   Mouth is dry,  I spent more than 20 minutes of face to face time with the patient.  Greater than 50% of time was spent in counseling and  coordination of care. We have discussed the diagnosis and differential and I answered the patient's questions.    Plan:  Treatment plan and additional workup :  I will ask her DME to address the new mask and fit , given above named problems.   She has voiced again  an interest in bariatric surgery.   Melvyn Novas, MD 03/09/2021, 2:09 PM  Certified in Neurology by ABPN Certified in Sleep Medicine by [facility] [address], [address] [place], [place] [address]

## 2021-03-13 DIAGNOSIS — H40013 Open angle with borderline findings, low risk, bilateral: Secondary | ICD-10-CM | POA: Diagnosis not present

## 2021-03-13 DIAGNOSIS — H40033 Anatomical narrow angle, bilateral: Secondary | ICD-10-CM | POA: Diagnosis not present

## 2021-03-13 DIAGNOSIS — H04123 Dry eye syndrome of bilateral lacrimal glands: Secondary | ICD-10-CM | POA: Diagnosis not present

## 2021-04-03 DIAGNOSIS — M545 Low back pain, unspecified: Secondary | ICD-10-CM | POA: Diagnosis not present

## 2021-04-03 DIAGNOSIS — R109 Unspecified abdominal pain: Secondary | ICD-10-CM | POA: Diagnosis not present

## 2021-04-03 DIAGNOSIS — K469 Unspecified abdominal hernia without obstruction or gangrene: Secondary | ICD-10-CM | POA: Diagnosis not present

## 2021-04-03 DIAGNOSIS — R309 Painful micturition, unspecified: Secondary | ICD-10-CM | POA: Diagnosis not present

## 2021-04-07 ENCOUNTER — Other Ambulatory Visit: Payer: Self-pay | Admitting: Obstetrics and Gynecology

## 2021-04-07 DIAGNOSIS — R109 Unspecified abdominal pain: Secondary | ICD-10-CM

## 2021-04-10 ENCOUNTER — Other Ambulatory Visit: Payer: Self-pay

## 2021-04-10 ENCOUNTER — Ambulatory Visit
Admission: RE | Admit: 2021-04-10 | Discharge: 2021-04-10 | Disposition: A | Discharge status: Home / Self Care | Payer: Federal, State, Local not specified - PPO | Source: Ambulatory Visit | Attending: Obstetrics and Gynecology | Admitting: Obstetrics and Gynecology

## 2021-04-10 DIAGNOSIS — K760 Fatty (change of) liver, not elsewhere classified: Secondary | ICD-10-CM | POA: Diagnosis not present

## 2021-04-10 DIAGNOSIS — R109 Unspecified abdominal pain: Secondary | ICD-10-CM

## 2021-04-10 MED ORDER — IOPAMIDOL (ISOVUE-300) INJECTION 61%
100.0000 mL | Freq: Once | INTRAVENOUS | Status: AC | PRN
  Administered 2021-04-10: 100 mL via INTRAVENOUS

## 2021-05-12 DIAGNOSIS — R1013 Epigastric pain: Secondary | ICD-10-CM | POA: Diagnosis not present

## 2021-05-12 DIAGNOSIS — K219 Gastro-esophageal reflux disease without esophagitis: Secondary | ICD-10-CM | POA: Diagnosis not present

## 2021-05-12 DIAGNOSIS — M6208 Separation of muscle (nontraumatic), other site: Secondary | ICD-10-CM | POA: Diagnosis not present

## 2021-05-12 DIAGNOSIS — K449 Diaphragmatic hernia without obstruction or gangrene: Secondary | ICD-10-CM | POA: Diagnosis not present

## 2021-05-18 DIAGNOSIS — K3189 Other diseases of stomach and duodenum: Secondary | ICD-10-CM | POA: Diagnosis not present

## 2021-05-18 DIAGNOSIS — K2980 Duodenitis without bleeding: Secondary | ICD-10-CM | POA: Diagnosis not present

## 2021-05-18 DIAGNOSIS — K219 Gastro-esophageal reflux disease without esophagitis: Secondary | ICD-10-CM | POA: Diagnosis not present

## 2021-05-18 DIAGNOSIS — K2930 Chronic superficial gastritis without bleeding: Secondary | ICD-10-CM | POA: Diagnosis not present

## 2021-05-18 DIAGNOSIS — K2289 Other specified disease of esophagus: Secondary | ICD-10-CM | POA: Diagnosis not present

## 2021-05-18 DIAGNOSIS — K269 Duodenal ulcer, unspecified as acute or chronic, without hemorrhage or perforation: Secondary | ICD-10-CM | POA: Diagnosis not present

## 2021-05-18 DIAGNOSIS — R12 Heartburn: Secondary | ICD-10-CM | POA: Diagnosis not present

## 2021-06-01 NOTE — Telephone Encounter (Signed)
error 

## 2021-06-26 DIAGNOSIS — Z9621 Cochlear implant status: Secondary | ICD-10-CM | POA: Diagnosis not present

## 2021-06-26 DIAGNOSIS — Z6841 Body Mass Index (BMI) 40.0 and over, adult: Secondary | ICD-10-CM | POA: Diagnosis not present

## 2021-06-26 DIAGNOSIS — H9202 Otalgia, left ear: Secondary | ICD-10-CM | POA: Diagnosis not present

## 2021-06-26 DIAGNOSIS — H903 Sensorineural hearing loss, bilateral: Secondary | ICD-10-CM | POA: Diagnosis not present

## 2021-06-26 DIAGNOSIS — Z45321 Encounter for adjustment and management of cochlear device: Secondary | ICD-10-CM | POA: Diagnosis not present

## 2021-06-26 DIAGNOSIS — H905 Unspecified sensorineural hearing loss: Secondary | ICD-10-CM | POA: Diagnosis not present

## 2021-07-03 ENCOUNTER — Emergency Department (HOSPITAL_COMMUNITY)
Admission: EM | Admit: 2021-07-03 | Discharge: 2021-07-04 | Disposition: A | Discharge status: Home / Self Care | Payer: Federal, State, Local not specified - PPO | Attending: Emergency Medicine | Admitting: Emergency Medicine

## 2021-07-03 ENCOUNTER — Encounter (HOSPITAL_COMMUNITY): Payer: Self-pay | Admitting: Emergency Medicine

## 2021-07-03 DIAGNOSIS — R202 Paresthesia of skin: Secondary | ICD-10-CM | POA: Diagnosis not present

## 2021-07-03 DIAGNOSIS — Z96642 Presence of left artificial hip joint: Secondary | ICD-10-CM | POA: Insufficient documentation

## 2021-07-03 DIAGNOSIS — R519 Headache, unspecified: Secondary | ICD-10-CM | POA: Insufficient documentation

## 2021-07-03 DIAGNOSIS — Z79899 Other long term (current) drug therapy: Secondary | ICD-10-CM | POA: Insufficient documentation

## 2021-07-03 DIAGNOSIS — R220 Localized swelling, mass and lump, head: Secondary | ICD-10-CM | POA: Insufficient documentation

## 2021-07-03 DIAGNOSIS — Z794 Long term (current) use of insulin: Secondary | ICD-10-CM | POA: Insufficient documentation

## 2021-07-03 DIAGNOSIS — J45909 Unspecified asthma, uncomplicated: Secondary | ICD-10-CM | POA: Diagnosis not present

## 2021-07-03 LAB — CBC WITH DIFFERENTIAL/PLATELET
Abs Immature Granulocytes: 0.14 10*3/uL — ABNORMAL HIGH (ref 0.00–0.07)
Basophils Absolute: 0.1 10*3/uL (ref 0.0–0.1)
Basophils Relative: 1 %
Eosinophils Absolute: 0.1 10*3/uL (ref 0.0–0.5)
Eosinophils Relative: 1 %
HCT: 47.7 % — ABNORMAL HIGH (ref 36.0–46.0)
Hemoglobin: 15.4 g/dL — ABNORMAL HIGH (ref 12.0–15.0)
Immature Granulocytes: 1 %
Lymphocytes Relative: 24 %
Lymphs Abs: 3.9 10*3/uL (ref 0.7–4.0)
MCH: 29.6 pg (ref 26.0–34.0)
MCHC: 32.3 g/dL (ref 30.0–36.0)
MCV: 91.7 fL (ref 80.0–100.0)
Monocytes Absolute: 1.5 10*3/uL — ABNORMAL HIGH (ref 0.1–1.0)
Monocytes Relative: 9 %
Neutro Abs: 10.3 10*3/uL — ABNORMAL HIGH (ref 1.7–7.7)
Neutrophils Relative %: 64 %
Platelets: 395 10*3/uL (ref 150–400)
RBC: 5.2 MIL/uL — ABNORMAL HIGH (ref 3.87–5.11)
RDW: 13.2 % (ref 11.5–15.5)
WBC: 16 10*3/uL — ABNORMAL HIGH (ref 4.0–10.5)
nRBC: 0 % (ref 0.0–0.2)

## 2021-07-03 LAB — BASIC METABOLIC PANEL
Anion gap: 9 (ref 5–15)
BUN: 20 mg/dL (ref 6–20)
CO2: 27 mmol/L (ref 22–32)
Calcium: 9.3 mg/dL (ref 8.9–10.3)
Chloride: 102 mmol/L (ref 98–111)
Creatinine, Ser: 0.92 mg/dL (ref 0.44–1.00)
GFR, Estimated: >60 mL/min (ref >60)
Glucose, Bld: 96 mg/dL (ref 70–99)
Potassium: 4.1 mmol/L (ref 3.5–5.1)
Sodium: 138 mmol/L (ref 135–145)

## 2021-07-03 NOTE — ED Triage Notes (Signed)
Pt arrives c/o L sided head swelling due to cochlear implant. Pt states she was seen at [facility] and that device was fine. Had a tooth pulled 10/5 and has been having issues since. Pt appears in NAD, airway intact, respirations even/unlabored.

## 2021-07-03 NOTE — ED Provider Notes (Signed)
Pain andEmergency Medicine Provider Triage Evaluation Note  Rhonda Simmons , a 60 y.o. female  was evaluated in triage.  Pt complains of left jaw, ear, and left-sided head swelling times several weeks.  Patient recently had a tooth pulled on 10/5, and states she has been having issues ever since.  She does have a cochlear implant, was seen at [facility] and was told that the device was functioning properly.  However she does note swelling worse over the area of the internal magnet.  States she has been feeling hot at home, no documented fever.  Of note, patient reads lips well, not requiring interpreter  Review of Systems  Positive: Left-sided jaw pain, left ear pain, left sided head pain, headache Negative: Vision change, shortness of breath, difficulty swallowing  Physical Exam  BP (!) 140/94 (BP Location: Right Arm)   Pulse (!) 116   Temp 98.6 F (37 C) (Oral)   Resp 16   Ht 5\' 2"  (1.575 m)   Wt 102.1 kg   SpO2 97%   BMI 41.15 kg/m  Gen:   Awake, no distress   Resp:  Normal effort  MSK:   Moves extremities without difficulty  Other:  No mastoid tenderness to palpation, no trismus, left external and internal ear exam normal with normal TM  Medical Decision Making  Medically screening exam initiated at 5:18 PM.  Appropriate orders placed.  Rhonda Simmons was informed that the remainder of the evaluation will be completed by another provider, this initial triage assessment does not replace that evaluation, and the importance of remaining in the ED until their evaluation is complete.     Luevenia Maxin 07/03/21 1724    07/05/21, MD 07/04/21 1520

## 2021-07-04 ENCOUNTER — Emergency Department (HOSPITAL_COMMUNITY): Payer: Federal, State, Local not specified - PPO

## 2021-07-04 DIAGNOSIS — R519 Headache, unspecified: Secondary | ICD-10-CM | POA: Diagnosis not present

## 2021-07-04 MED ORDER — GABAPENTIN 100 MG PO CAPS: 100.0000 mg | ORAL_CAPSULE | Freq: Three times a day (TID) | ORAL | 0 refills | Status: DC

## 2021-07-04 MED ORDER — ACETAMINOPHEN 325 MG PO TABS
650.0000 mg | ORAL_TABLET | Freq: Once | ORAL | Status: AC
  Administered 2021-07-04: 650 mg via ORAL
  Filled 2021-07-04: qty 2

## 2021-07-04 MED ORDER — IOHEXOL 300 MG/ML  SOLN
75.0000 mL | Freq: Once | INTRAMUSCULAR | Status: AC | PRN
  Administered 2021-07-04: 75 mL via INTRAVENOUS

## 2021-07-04 NOTE — ED Provider Notes (Signed)
[facility] EMERGENCY DEPARTMENT Provider Note   CSN: [id number] Arrival date & time: 07/03/21  1540     History Chief Complaint  Patient presents with   Facial Swelling    Rhonda Simmons is a 60 y.o. female.  Patient is a 60 year old female with a history of cochlear implants and TMJ who presents with complaints of chronic pain which she first noticed sometime after June 07, 2021 after having a tooth on her left lower jaw pulled.  She describes the pain as constant and varying in nature, sometimes sharp vs dull vs throbbing vs burning.  She does endorse some numbness tingling.  Pain is generally over the left side of her head, worst around cochlear implant..  She has tried Tylenol, ibuprofen, and some of her mother's hydrocodone which has not helped.  Due to unremittent nature of pain she return to her dentist who prescribed amoxicillin which she completed course of roughly 2 days ago.  She also was evaluated by [facility]NT for this pain who prescribed her a course of prednisone which she also completed roughly 2 days ago.  Neither of these medications have provided relief.  She does endorse some swelling in her head and subjective fevers but has not taken her temperature.  The history is provided by the patient. No language interpreter was used.      Past Medical History:  Diagnosis Date   Asthma    pt denies   Chronic bronchitis    Dr. 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   History of carpal tunnel syndrome    Hyperlipidemia    Migraines    Neuropathy    pt unaware   OA (osteoarthritis)    Obesity    OSA (obstructive sleep apnea)    Plantar fasciitis, right    Umbilical hernia     Patient Active Problem List   Diagnosis Date Noted   Chronic fatigue 06/29/2020   Arthritis 04/26/2020    Depression 04/26/2020   High cholesterol 04/26/2020   History of cochlear implant 02/12/2020   Swelling of left parotid gland 02/12/2020   S/P total hip arthroplasty 06/14/2019   Primary osteoarthritis of left hip 06/12/2019   OSA on CPAP 10/16/2018   Myalgia 04/02/2018   Myopathy 04/02/2018   Neck stiffness 04/02/2018   Pain in joint, multiple sites 04/02/2018   Retro-orbital pain, bilateral 03/18/2017   Blurred vision, bilateral 03/18/2017   OSA (obstructive sleep apnea) 03/18/2017   Super obese 03/18/2017   Referred ear pain 06/08/2016   Mononeuritis 03/10/2014   Dysesthesia of multiple sites 02/03/2013   Skin sensation disturbance 02/03/2013   Neuropathy    Hearing loss of both ears    LEG PAIN, RIGHT 05/06/2008   EUSTACHIAN TUBE DYSFUNCTION 01/10/2008   HEARING IMPAIRMENT 10/27/2007   ALLERGIC RHINITIS 10/27/2007   ASTHMA 10/27/2007   INSOMNIA 10/27/2007   HEADACHE 10/27/2007   DYSPHAGIA UNSPECIFIED 10/27/2007   ANGIOEDEMA 10/27/2007   Dysphagia 10/27/2007    Past Surgical History:  Procedure Laterality Date   ABDOMINAL HYSTERECTOMY     CARPAL TUNNEL RELEASE Right    CESAREAN SECTION     x2   COCHLEAR IMPLANT Left    COCHLEAR IMPLANT REVISION Left 01/2016  COLONOSCOPY     DENTAL SURGERY     NECK SURGERY     Rods and screws placed   SHOULDER ARTHROSCOPY Right    TOTAL HIP ARTHROPLASTY Left 06/12/2019   Procedure: TOTAL HIP ARTHROPLASTY ANTERIOR APPROACH;  Surgeon: Jodi Geralds, MD;  Location: [facility]S;  Service: Orthopedics;  Laterality: Left;   UPPER GI ENDOSCOPY       OB History   No obstetric history on file.     Family History  Problem Relation Age of Onset   Alzheimer's disease Mother    Alzheimer's disease Father     Social History   Tobacco Use   Smoking status: Never   Smokeless tobacco: Never  Vaping Use   Vaping Use: Never used  Substance Use Topics   Alcohol use: Yes    Comment: occas   Drug use: Not Currently    Home  Medications Prior to Admission medications   Medication Sig Start Date End Date Taking? Authorizing Provider  gabapentin (NEURONTIN) 100 MG capsule Take 1 capsule (100 mg total) by mouth 3 (three) times daily. 07/04/21 08/03/21 Yes Adron Bene, MD  ALPRAZolam Prudy Feeler) 0.25 MG tablet Take 0.25 mg by mouth daily as needed for anxiety.  06/10/18   [provider]  amLODipine (NORVASC) 5 MG tablet Take 5 mg by mouth daily. 02/20/21   [provider]  BD PEN NEEDLE NANO 2ND GEN 32G X 4 MM MISC USE WITH VICTOZA** 12/07/19   [provider]  Biotin 5000 MCG TABS Take 5,000 mcg by mouth daily.    [provider]  DULoxetine (CYMBALTA) 60 MG capsule Take 60 mg by mouth daily.  03/08/18   [provider]  EPINEPHrine (EPIPEN 2-PAK) 0.3 mg/0.3 mL IJ SOAJ injection Inject 0.3 mLs (0.3 mg total) into the muscle once as needed (for severe allergic reaction). CAll 911 immediately if you have to use this medicine 04/20/14   Antony Madura, PA-C  Eszopiclone 3 MG TABS Take 3 mg by mouth at bedtime. 04/18/20   [provider]  hydrOXYzine (ATARAX/VISTARIL) 25 MG tablet Take 25 mg by mouth every 6 (six) hours as needed for itching.  01/28/14   [provider]  phentermine (ADIPEX-P) 37.5 MG tablet Take 37.5 mg by mouth daily. 01/20/20   [provider]  Semaglutide,0.25 or 0.5MG /DOS, (OZEMPIC, 0.25 OR 0.5 MG/DOSE,) 2 MG/1.5ML SOPN Inject 0.5 mg into the skin once a week.    [provider]    Allergies    Other, Acetaminophen-pamabrom, Compazine, Fexofenadine, Midol [aspirin-cinnamedrine-caffeine], and Naproxen sodium  Review of Systems   Review of Systems  Constitutional:  Positive for fever. Negative for chills and fatigue.  HENT:  Positive for dental problem and facial swelling. Negative for drooling, ear discharge, ear pain, postnasal drip, rhinorrhea, sinus pressure, sinus pain, sneezing, sore throat and trouble swallowing.   Eyes:   Negative for pain and discharge.  Respiratory:  Negative for cough and shortness of breath.   Cardiovascular:  Negative for chest pain.  Gastrointestinal: Negative.   Genitourinary: Negative.   Musculoskeletal: Negative.   Neurological:  Negative for dizziness and light-headedness.  Hematological: Negative.   Psychiatric/Behavioral: Negative.     Physical Exam Updated Vital Signs BP 140/90 (BP Location: Right Arm)   Pulse 84   Temp 98.1 F (36.7 C) (Oral)   Resp 14   Ht 5\' 2"  (1.575 m)   Wt 102.1 kg   SpO2 98%   BMI 41.15 kg/m   Physical Exam  Constitutional:      General: She is not in acute distress.    Appearance: Normal appearance. She is obese. She is not ill-appearing, toxic-appearing or diaphoretic.  HENT:     Head: Normocephalic and atraumatic.     Jaw: No tenderness or pain on movement.     Salivary Glands: Right salivary gland is not diffusely enlarged or tender. Left salivary gland is not diffusely enlarged or tender.     Right Ear: External ear normal. No laceration, drainage, swelling or tenderness. Tympanic membrane is not erythematous or bulging.     Left Ear: External ear normal. No laceration, drainage, swelling or tenderness. Tympanic membrane is not erythematous or bulging.     Ears:     Comments: Cochlear implant right, implant     Nose: Nose normal. No congestion or rhinorrhea.     Mouth/Throat:     Mouth: Mucous membranes are dry.     Pharynx: Oropharynx is clear. No oropharyngeal exudate or posterior oropharyngeal erythema.  Eyes:     General:        Right eye: No discharge.        Left eye: No discharge.     Extraocular Movements: Extraocular movements intact.     Conjunctiva/sclera: Conjunctivae normal.     Pupils: Pupils are equal, round, and reactive to light.  Cardiovascular:     Rate and Rhythm: Normal rate and regular rhythm.     Heart sounds: No murmur heard.   No friction rub. No gallop.  Pulmonary:     Effort: Pulmonary effort is  normal.     Breath sounds: Normal breath sounds.  Musculoskeletal:     Cervical back: Normal range of motion and neck supple. No rigidity or tenderness.  Neurological:     Mental Status: She is alert.    ED Results / Procedures / Treatments   Labs (all labs ordered are listed, but only abnormal results are displayed) Labs Reviewed  CBC WITH DIFFERENTIAL/PLATELET - Abnormal; Notable for the following components:      Result Value   WBC 16.0 (*)    RBC 5.20 (*)    Hemoglobin 15.4 (*)    HCT 47.7 (*)    Neutro Abs 10.3 (*)    Monocytes Absolute 1.5 (*)    Abs Immature Granulocytes 0.14 (*)    All other components within normal limits  BASIC METABOLIC PANEL    EKG None  Radiology CT Maxillofacial W Contrast  Result Date: 07/04/2021 CLINICAL DATA:  60 year old female with tooth pulled last month, maxillofacial pain. Chronic left side cochlear implant with head swelling. EXAM: CT MAXILLOFACIAL WITH CONTRAST TECHNIQUE: Multidetector CT imaging of the maxillofacial structures was performed with intravenous contrast. Multiplanar CT image reconstructions were also generated. CONTRAST:  7mL OMNIPAQUE IOHEXOL 300 MG/ML  SOLN COMPARISON:  Head and cervical spine CT 03/22/2017. FINDINGS: Osseous: Left mandible posterior bicuspid tooth extraction site. Surrounding thinning of bone, but no fracture or adverse features. No superimposed acute dental finding. Mandible intact and normally located. Maxilla, zygoma, pterygoid, and nasal bones appear intact. Intact central skull base. Partially visible cervical spine degeneration and previous C5 ACDF. Orbits: Intact orbital walls. Orbits soft tissues appears symmetric, normal. Sinuses: Chronic left mastoidectomy and cochlear implant appear stable since 2018. Surgical cavity, residual left mastoids and left tympanic cavity remain clear. Contralateral right tympanic cavity and mastoids are clear. Bilateral paranasal sinuses are clear aside from a small  chronic right anterior ethmoid mucocele which is stable or smaller since  2018 (series 4, image 58). Soft tissues: Trace retained secretions in the nasopharynx. Negative visible pharynx elsewhere, parapharyngeal spaces, larynx, retropharyngeal space (partially retropharyngeal carotid), sublingual space, masticator and parotid spaces. There is asymmetric partial atrophy of the left submandibular gland. The right gland appears within normal limits. No sialolithiasis identified. No upper cervical lymphadenopathy. Major vascular structures in the visible neck and at the skull base appear patent. Limited intracranial: Mild streak artifact from left cochlear implant, otherwise negative. IMPRESSION: 1. Recent left mandible posterior bicuspid tooth extraction with no adverse features. 2. Chronic left mastoidectomy and cochlear implant appear stable since 2018 and satisfactory. Electronically Signed   By: Odessa Fleming M.D.   On: 07/04/2021 05:42    Procedures Procedures   Medications Ordered in ED Medications  iohexol (OMNIPAQUE) 300 MG/ML solution 75 mL (75 mLs Intravenous Contrast Given 07/04/21 0514)  acetaminophen (TYLENOL) tablet 650 mg (650 mg Oral Given 07/04/21 4132)    ED Course  I have reviewed the triage vital signs and the nursing notes.  Pertinent labs & imaging results that were available during my care of the patient were reviewed by me and considered in my medical decision making (see chart for details).    MDM Rules/Calculators/A&P                           Patient presented with complaints of left-sided pain in her head predominantly focused around the cochlear implant on the left side present since June 07, 2021.  Pain is variable in nature but she describes it as sharp, throbbing, and burning with some associated numbness and tingling.  This pain began after a tooth extraction performed.  Feel that this pain is likely neuropathic in nature exacerbated by her recent tooth extraction.  She  has been evaluated by her dentist to prescribe her a course of amoxicillin which she recently completed.  She was also evaluated by [facility]NT who gave her a course of prednisone.  During evaluation in the emergency department BMP was grossly normal.  CBC did show elevation of her white blood cell count up to 16 however this could be explained by her recent steroid use.  She has endorsed subjective fevers but has not taken her temperature.  There is no edema fluctuance or erythema of the area to suggest localized infection.  She has no tenderness to her neck or inside her mouth with no obvious swelling either.  Full range of motion of both neck and jaw.  No concern for odontogenic infection.  Pain is likely neuropathic in nature.  Will treat with gabapentin 100 mg 3 times daily with instructions to follow-up with her dentist.  If dentist has no further recommendations for management can follow-up with her PCP who may then order MRI if she is continuing to have pain despite medication.  Final Clinical Impression(s) / ED Diagnoses Final diagnoses:  None    Rx / DC Orders ED Discharge Orders          Ordered    gabapentin (NEURONTIN) 100 MG capsule  3 times daily        07/04/21 1224             Adron Bene, MD 07/04/21 1407    Gwyneth Sprout, MD 07/10/21 1542

## 2021-07-04 NOTE — Discharge Instructions (Addendum)
Dear Mrs. Rhonda Simmons,  We evaluated you for this pain left side of your head.  The CT scan of your head did not show any concern for infection fracture or other masses.  It did not show any swelling around the cochlear implant that would be concerning at this time either.  This pain that you are experiencing is likely nerve pain related to recently having the tooth extracted.  It can also be compounded by the TMJ that you have.  We will give you a prescription for gabapentin 100 mg to take 3 times daily.  This medication helps specifically with nerve pain.  We also recommend following up with your dentist in 1 to 2 weeks to see if there is any other management that they recommend.  If they do not recommend anything else, we advise you follow-up with your PCP who could order an MRI if you are still having continued pain.

## 2021-07-13 ENCOUNTER — Other Ambulatory Visit: Payer: Self-pay | Admitting: Family Medicine

## 2021-07-13 DIAGNOSIS — R519 Headache, unspecified: Secondary | ICD-10-CM

## 2021-08-03 ENCOUNTER — Ambulatory Visit (HOSPITAL_COMMUNITY)
Admission: RE | Admit: 2021-08-03 | Discharge: 2021-08-03 | Disposition: A | Discharge status: Home / Self Care | Payer: Federal, State, Local not specified - PPO | Source: Ambulatory Visit | Attending: Family Medicine | Admitting: Family Medicine

## 2021-08-03 ENCOUNTER — Other Ambulatory Visit: Payer: Self-pay

## 2021-08-03 DIAGNOSIS — R519 Headache, unspecified: Secondary | ICD-10-CM | POA: Insufficient documentation

## 2021-08-03 MED ORDER — GADOBUTROL 1 MMOL/ML IV SOLN
9.0000 mL | Freq: Once | INTRAVENOUS | Status: AC | PRN
  Administered 2021-08-03: 9 mL via INTRAVENOUS

## 2021-08-30 DIAGNOSIS — K2930 Chronic superficial gastritis without bleeding: Secondary | ICD-10-CM | POA: Diagnosis not present

## 2021-08-30 DIAGNOSIS — K219 Gastro-esophageal reflux disease without esophagitis: Secondary | ICD-10-CM | POA: Diagnosis not present

## 2021-08-30 DIAGNOSIS — K269 Duodenal ulcer, unspecified as acute or chronic, without hemorrhage or perforation: Secondary | ICD-10-CM | POA: Diagnosis not present

## 2021-08-30 DIAGNOSIS — K2980 Duodenitis without bleeding: Secondary | ICD-10-CM | POA: Diagnosis not present

## 2021-09-06 DIAGNOSIS — Z6841 Body Mass Index (BMI) 40.0 and over, adult: Secondary | ICD-10-CM | POA: Diagnosis not present

## 2021-09-06 DIAGNOSIS — H903 Sensorineural hearing loss, bilateral: Secondary | ICD-10-CM | POA: Diagnosis not present

## 2021-09-06 DIAGNOSIS — M26623 Arthralgia of bilateral temporomandibular joint: Secondary | ICD-10-CM | POA: Diagnosis not present

## 2021-09-07 ENCOUNTER — Telehealth: Payer: Self-pay

## 2021-09-07 NOTE — Telephone Encounter (Signed)
Cpap data not found. LVM for pt to bring cpap/power cord to appt.  °

## 2021-09-11 ENCOUNTER — Ambulatory Visit: Payer: Federal, State, Local not specified - PPO | Admitting: Neurology

## 2021-09-11 ENCOUNTER — Encounter: Payer: Self-pay | Admitting: Neurology

## 2021-09-11 VITALS — BP 153/90 | HR 100 | Ht 62.0 in | Wt 229.0 lb

## 2021-09-11 DIAGNOSIS — Z9621 Cochlear implant status: Secondary | ICD-10-CM | POA: Diagnosis not present

## 2021-09-11 DIAGNOSIS — G4733 Obstructive sleep apnea (adult) (pediatric): Secondary | ICD-10-CM | POA: Diagnosis not present

## 2021-09-11 DIAGNOSIS — E669 Obesity, unspecified: Secondary | ICD-10-CM

## 2021-09-11 DIAGNOSIS — M0579 Rheumatoid arthritis with rheumatoid factor of multiple sites without organ or systems involvement: Secondary | ICD-10-CM

## 2021-09-11 DIAGNOSIS — G629 Polyneuropathy, unspecified: Secondary | ICD-10-CM

## 2021-09-11 NOTE — Progress Notes (Signed)
[facility]   Provider:  Larey Seat, M D  Primary Care Physician:  Shirline Frees, MD   Referring Provider: Shirline Frees, MD    Chief Complaint  Patient presents with   Follow-up    RM 11, alone. Has not used CPAP yet at all. Brought with her to appt.     Rv - 09-11-2021: Rv for Rhonda Simmons, just 61 years old. She brought her newish CPAP to this appointment , has been happy with her cochlear implant and is still not using a different head gear.  She has not used her CPAP in a while now.  Her BMI is slightly reduced 41.8 kg per metered squared.  Her blood pressure today is 153/98 which is elevated.  Her Epworth sleepiness score is up to 15 points again that she is not longer using CPAP.  Medication list is fairly identical to before except that she is no longer taking the insulin pen and Neurontin she also because of biotin as a nutritional supplement.  She has started on Ozempic for weight loss.  No longer on phentermine.  We need to address the non compliance with CPAP :Mask, headgear, cushions, filters, heated tubing and water chamber Sleep apnea patient with cochlear implant , needs a fitting for headgear not touching the are of the implant. she will not receive new supplies after not using PAP for over 6 months.    I will ask for a new download, just seeing when the machine was used last: Epworth is 15/ 24 now.     RV 03-09-2021: Rv for Rhonda Simmons.  Rhonda Simmons has not been able to use her CPAP since May because she has been a cochlear implant and the device and the paired headgear are actually irritating 1 another.  So she has been unable to tolerate the interface.  I think we probably could do the very best by using a other dental device or a DreamWear with a different halo.  These may not push directly at the area of the temple. Unfortunately, she has not contacted DME- and now would be deemed non compliant. Her BMI would exclude Inspire, and she wonders about  a dental device.  She is interested in bariatric surgery, sleeve.    RV : 06-29-2020: I have the pleasure of seeing Rhonda Simmons today, meanwhile 61 year old Caucasian female with a severe hearing impairment.  Her mom ad her daughter have  also been my patients.  She presents to follow-up has been using a CPAP machine in the past but was advised that it is not good for her to use his CPAP machine while she had mold infestation in her home.  She has been waiting on some replacement parts I guess these are filters.  I think she is safe to use his CPAP machine if it was completely cleaned.  She also needs a replacement mask interface and states that she was given a large size when she actually needs a medium or small. I have reviewed the data from August 2021 the last time she had used her CPAP machine and at the time she reached a residual AHI of 2.9/h the pressure at the 95th percentile was 8.9 cmH2O she is using an AutoSet with a serial #[id number] is a minimum pressure of 5 and a maximum pressure of 10 cmH2O and 3 cm EPR.  The machine has been comfortable to use but she needs a new mask.  She has not use the machine  now for about 2 months and due to that her Epworth Sleepiness Scale is higher today at 13 out of 24 possible points she is also much more fatigued but endorsed 40 points on the fatigue severity score.  Part of the stressors is not just the mold infestation in her home and the necessary renovation and other people but also that her mom has moved to hospice.  RV on 08-18-2019 for this long time established patient with severe hearing loss. Rhonda Simmons had discussed in her last visit with me that she may prefer a dental device for the treatment of sleep apnea but the cost of about $800 were too expensive for her.  The patient has a history of degenerative disc disease in the cervical spine, obesity, dysesthesia of multiple sites, intractable migraine and obstructive sleep apnea. She also  has TMJ pain which can make a dental device trickier to manufacture.  While she recovered from hip surgery ( 06-12-2019)  she became poorly compliant with CAP as she slept on the sofa-  She is still in PT.   However due to the lack of alternative in the treatment of obstructive sleep apnea we may have to return to see obstructive sleep apnea treatment by CPAP.  The last time the patient used her machine was in October at the time she used it on average for 7 hours 5 minutes, she is using an AutoSet with a minimum pressure of 5 and a maximum pressure of 10 cmH2O with 3 cm EPR and her AHI was 3.6.  There were almost equal central and obstructive apneas present.  She endorsed the Epworth Sleepiness Scale today at 15 which is a higher number.  She has had a lot of stress currently , her daughter has emancipated herself  from her parents, but she is not even calling her after surgery or for her birthday. There is a boyfriend the parents did not approve of.     I have the pleasure of meeting today with Rhonda Simmons, a 61 year old longtime established female patient.  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  She has a lifelong hearing deficit.  She has a history of waxing and waning migrainous headaches in the past, those have improved.  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  She also developed muscle aches and pains and was suspected to have polymyalgia rheumatica but further laboratory tests have not confirmed that.  However she felt much better while on steroids and her pain discomfort and ache returned after the steroids were weaned off.  She will no longer be followed by rheumatology, gave her diclofenac . PA at the orthopedics office filled tizanidine.  She has severe hip pain, and is too heavy for the surgery.   Rhonda.  Simmons used her CPAP 29 out of 30 days with a compliance of 97% for time with an average of 8 hours and 6 minutes.  CPAP remains set at 7 cm water pressure with 3 cm EPR her 95th percentile pressure is currently at 12.6, her AHI is 4.0 it seems that all apneas are obstructive in nature.  She is using a nasal pillow interface.  I would like to increase the set pressure from 6-10 cmH2O.     03-26-2018;  Rhonda Simmons is a 61 y.o. female , seen here  as in a referral  from her optometrist , Rhonda Simmons , OD for  "terrible migraine headaches " and " I feel tired all the time". It was her eye doctor that saw her for sharp pains that seem to be located behind the eye, headaches, vision impairment this shadow and overlapping of visual field. All decreased acuity and dry eyes bilaterally. Medication list includes Cymbalta, Excedrin and Motrin.  I reviewed her ophthalmology records she has a corrected vision is 20/50 on the right, 20/30 on the right. She was suspected to develop astigmatism, hyperopia, presbyopia. Nuclear cataract at this time not mature enough for surgical procedure. Headaches which could be either related but the patient cannot have an MRI because of cochlear implants. And she has a small cyst at the temporal fovea.  Chief complaint according to patient : Rhonda Simmons reports that she has had headaches nearly every morning when she wakes up, associated with neck stiffness, nausea and at times vomiting. She is status post anterior cervical fusion and does have a titanium rod implanted. She has had headaches for a long time but not to this intensity, not of the same quality and not of the same frequency. She often feels as if there is pressure applied on the eyeball and she actually saw ophthalmology, and needs to return for visual field examination. She states that beginning cataracts were suspected. She has TMJ and bruxism. She rarely is feeling well.  She has no energy to clean house, do  anything. Takes Excedrin. She had undergone a spinal tap in high school and developed severe headaches, was given compazine - had a seizure at age 70- attributed to the medication . Lost awareness for 2-3 minutes.  She was diagnosed years ago with sleep apnea, not in our lab, she's not using CPAP.  I have known Rhonda Simmons over 10 years as her father's and mother's neurologist and I have also seen her daughter for headaches. 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  Sleep habits are as follows: goes to bed with headaches, but often after having slept on and off all afternoon.  The patient reports that she goes anytime between 11 PM and 2 AM to bed - she does not have an established routine.   Sleep medical history and family sleep history: Her father had advancing Alzheimer's dementia before his death in 60 Dorothyann Peng) . Mother has had syncope, spells, dysphagia, failure to thrive , an TIA.   Social history:  Married, one daughter ( 36) . One son ( 62)  Rare ( one a week ) mountain dew , 2-3 iced tea a week. No tobacco use of any kind, seldomly alcohol -not  more than 2 or 3 times a year.   Interval History following her sleep studies, 03-26-2018. Rhonda Simmons reports good sleep on CPAP and her husband is happy she doesn't snore. She reports myalgias ,all over soreness, discomfort, weakness - she cannot lift arms above shoulder, standing up from a seated position causes pain in hamstrings. Climbing stairs is difficult. Proximal myalgia- myositis? She has seen ortho PA, who ordered blood work . She is supposed to have those today at [facility].     DIAGNOSIS  - PSG from PSG on 06/11/17 which showed moderately-severe Obstructive Sleep Apnea (OSA) with overall AHI of 29.4/hr. No significant supine positional accentuation, and snoring was loudest in REM. The [facility] baseline 02 saturation  was 98%, with the lowest  being 82%. Time spent below 89% saturation equaled 12 minutes.Obstructive Sleep Apnea, responding well to only 7 cm water pressure, heated humidity and nasal pillow. The patient was fitted with a ResMed AirFit P10 (Small).Upper Airway Resistance Syndrome.   DISCUSSION:  Larey Seat, M.D.   08-07-2017 .       Review of Systems: Out of a complete 14 system review, the patient complains of only the following symptoms, and all other reviewed systems are negative.  She has a cochlear implant and I cannot order MRI> she has not been treated with statins, she has no dermatomal pain,and no rash ( no dermatomyositis)   Headaches improved, now myalgia. Not longer snoring. OSA diagnosed.  Unfortunately after CPAP worked so well for the patient we now struggle with the headgear irritating the area over the cochlear implant.  I will also DME to refit her, her compliance here was not only 19% since these problems arose and she stopped CPAP.  Her last AHI was 2.0 which is a good resolution and her 95th percentile pressure was 8.7 cmH2O she has an AutoSet with a setting of 5 through 12 cm water pressure and 3 cm EPR and when she used CPAP on average she used it for 6 hours and 40 minutes at night.    Epworth sleepiness Score on CPAP was 9  and now is 15 / 24 again - off CPAP, FSS: 58/63 points.   Obesity: she has seen abdominal surgeon Kaylyn Lim, M.D, who recommended that she could undergo bariatric surgery. Depending on the sleep apnea evaluation results this may still be something she should pursue.   Unfortunately after CPAP worked so well for the patient we now struggle with the headgear irritating the area over the cochlear implant.  I will also DME to refit her, her compliance here was not only 19% since these problems arose and she stopped CPAP.  Her last AHI was 2.0 which is a good resolution and her 95th percentile pressure was 8.7 cmH2O she has an AutoSet with a setting of 5 through 12 cm  water pressure and 3 cm EPR and when she used CPAP on average she used it for 6 hours and 40 minutes at night.      Social History   Socioeconomic History   Marital status: Married    Spouse name: Not on file   Number of children: 2   Years of education: assoc   Highest education level: Not on file  Occupational History   Occupation: caretaker    Comment: for parents  Tobacco Use   Smoking status: Never   Smokeless tobacco: Never  Vaping Use   Vaping Use: Never used  Substance and Sexual Activity   Alcohol use: Yes    Comment: occas   Drug use: Not Currently   Sexual activity: Not on file  Other Topics Concern   Not on file  Social History Narrative   Not on file   Social Determinants of Health   Financial Resource Strain: Not on file  Food Insecurity: Not on file  Transportation Needs: Not on file  Physical Activity: Not on file  Stress: Not on file  Social Connections: Not on file  Intimate Partner Violence: Not on file    Family History  Problem Relation Age of Onset   Alzheimer's disease Mother    Alzheimer's disease Father     Past Medical History:  Diagnosis Date   Asthma  pt denies   Chronic bronchitis    Dr. 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   History of carpal tunnel syndrome    Hyperlipidemia    Migraines    Neuropathy    pt unaware   OA (osteoarthritis)    Obesity    OSA (obstructive sleep apnea)    Plantar fasciitis, right    Umbilical hernia     Past Surgical History:  Procedure Laterality Date   ABDOMINAL HYSTERECTOMY     CARPAL TUNNEL RELEASE Right    CESAREAN SECTION     x2   COCHLEAR IMPLANT Left    COCHLEAR IMPLANT REVISION Left 01/2016   COLONOSCOPY     DENTAL SURGERY     NECK SURGERY     Rods and screws placed   SHOULDER ARTHROSCOPY Right    TOTAL  HIP ARTHROPLASTY Left 06/12/2019   Procedure: TOTAL HIP ARTHROPLASTY ANTERIOR APPROACH;  Surgeon: Dorna Leitz, MD;  Location: [facility];  Service: Orthopedics;  Laterality: Left;   UPPER GI ENDOSCOPY      Current Outpatient Medications  Medication Sig Dispense Refill   ALPRAZolam (XANAX) 0.25 MG tablet Take 0.25 mg by mouth daily as needed for anxiety.      amLODipine (NORVASC) 5 MG tablet Take 5 mg by mouth daily.     BD PEN NEEDLE NANO 2ND GEN 32G X 4 MM MISC USE WITH VICTOZA**     DULoxetine (CYMBALTA) 60 MG capsule Take 60 mg by mouth daily.   6   EPINEPHrine (EPIPEN 2-PAK) 0.3 mg/0.3 mL IJ SOAJ injection Inject 0.3 mLs (0.3 mg total) into the muscle once as needed (for severe allergic reaction). CAll 911 immediately if you have to use this medicine 1 Device 0   hydrOXYzine (ATARAX/VISTARIL) 25 MG tablet Take 25 mg by mouth every 6 (six) hours as needed for itching.      phentermine (ADIPEX-P) 37.5 MG tablet Take 37.5 mg by mouth daily.     Semaglutide,0.25 or 0.5MG /DOS, (OZEMPIC, 0.25 OR 0.5 MG/DOSE,) 2 MG/1.5ML SOPN Inject 0.5 mg into the skin once a week.     Eszopiclone 3 MG TABS Take 3 mg by mouth at bedtime. (Patient not taking: Reported on 09/11/2021)     gabapentin (NEURONTIN) 100 MG capsule Take 1 capsule (100 mg total) by mouth 3 (three) times daily. 90 capsule 0   No current facility-administered medications for this visit.    Allergies as of 09/11/2021 - Review Complete 09/11/2021  Allergen Reaction Noted   Other Swelling 12/01/2012   Acetaminophen-pamabrom  01/20/2014   Compazine  10/26/2011   Fexofenadine  10/27/2007   Midol [aspirin-cinnamedrine-caffeine] Swelling 12/01/2012   Naproxen sodium Swelling     Vitals: BP (!) 153/90 (BP Location: Left Arm, Patient Position: Sitting, Cuff Size: Normal)    Pulse 100    Ht 5\' 2"  (1.575 m)    Wt 229 lb (103.9 kg)    SpO2 97%    BMI 41.88 kg/m  Last Weight:  Wt Readings from Last 1 Encounters:  09/11/21 229 lb (103.9 kg)    [id number] mass index is 41.88 kg/m.     Last Height:   Ht Readings from Last 1 Encounters:  09/11/21 5\' 2"  (1.575 m)  Physical exam:  General: The patient is awake, alert and appears not in acute distress. The patient is well groomed. Head: Normocephalic, atraumatic. Neck is supple. Mallampati 3 plus,  neck circumference:17. Nasal airflow -congestion noted  . Retrognathia is seen.  Cardiovascular:  Regular rate and rhythm, without  murmurs or carotid bruit, and without distended neck veins. Respiratory: Lungs are clear to auscultation. Skin:  Without evidence of edema, or rash Trunk: BMI is over 44- further increased over the last 18 month.  The patient's posture is erect  Neurologic exam :The patient is awake and alert, oriented to place and time.   Memory subjective described as intact. Attention span & concentration ability appears normal.  Speech is fluent, without dysarthria, dysphonia or aphasia. Mood and affect are appropriate.  Cranial nerves: Pupils are equal and briskly reactive to light. Extraocular movements  in vertical and horizontal planes intact and without nystagmus. Visual fields by finger perimetry are intact. Hearing impaired but improved with cochlear implant.  Facial sensation intact to fine touch.  Facial motor strength is symmetric , her  tongue and uvula move midline. Shoulder shrug was symmetrical.   Motor exam:  Passive range of motion of the neck as well as active neck motion retroflexion rotation and lateral bending all lead to a audible crackles. Crepitus. Normal tone, muscle bulk and symmetric strength in all extremities.  Sensory:  Gait and station: Patient walks without assistive device and is able unassisted to climb up to the exam table. Strength within normal limits. Stance is stable and normal.  Romberg testing is  negative. Deep tendon reflexes: in the upper and lower extremities are symmetric and intact. Babinski maneuver response is deferred.     Assessment:  After physical and neurologic examination, review of laboratory studies,  Personal review of imaging studies, reports of other /same  Imaging studies, results of polysomnography and / or neurophysiology testing and pre-existing records as far as provided in visit., my assessment is   1) OSA diagnosed and treated on CPAP - sleep improved . She is less insomnic and less fatigued. 7 hours of average sleep time.  CPAP supplies were ordered. - nasal pillow ResMed AirFit P10 (Small).CPAP was ordered autotitation device widow 5-10 cm water, 3 cm EPR,  optimal at  7 cm water pressure, heated humidity and nasal pillow ResMed AirFit P10 (Small).   2) Obesity -  on Ozempic now.   I spent more than 20 minutes of face to face time with the patient.  Greater than 50% of time was spent in counseling and coordination of care. We have discussed the diagnosis and differential and I answered the patient's questions.    Plan:  Treatment plan and additional workup :  I will ask her DME to address the new mask and fit , given above named problems.   She has voiced again  an interest in bariatric surgery.   Larey Seat, MD [id number], [id number] PM  Certified in Neurology by [employer] Certified in [place] by [facility] [address] [place], [place] [address]

## 2021-09-11 NOTE — Patient Instructions (Signed)
Sleep Apnea ?Sleep apnea affects breathing during sleep. It causes breathing to stop for 10 seconds or more, or to become shallow. People with sleep apnea usually snore loudly. ?It can also increase the risk of: ?Heart attack. ?Stroke. ?Being very overweight (obese). ?Diabetes. ?Heart failure. ?Irregular heartbeat. ?High blood pressure. ?The goal of treatment is to help you breathe normally again. ?What are the causes? ?The most common cause of this condition is a collapsed or blocked airway. ?There are three kinds of sleep apnea: ?Obstructive sleep apnea. This is caused by a blocked or collapsed airway. ?Central sleep apnea. This happens when the brain does not send the right signals to the muscles that control breathing. ?Mixed sleep apnea. This is a combination of obstructive and central sleep apnea. ?What increases the risk? ?Being overweight. ?Smoking. ?Having a small airway. ?Being older. ?Being female. ?Drinking alcohol. ?Taking medicines to calm yourself (sedatives or tranquilizers). ?Having family members with the condition. ?Having a tongue or tonsils that are larger than normal. ?What are the signs or symptoms? ?Trouble staying asleep. ?Loud snoring. ?Headaches in the morning. ?Waking up gasping. ?Dry mouth or sore throat in the morning. ?Being sleepy or tired during the day. ?If you are sleepy or tired during the day, you may also: ?Not be able to focus your mind (concentrate). ?Forget things. ?Get angry a lot and have mood swings. ?Feel sad (depressed). ?Have changes in your personality. ?Have less interest in sex, if you are female. ?Be unable to have an erection, if you are female. ?How is this treated? ? ?Sleeping on your side. ?Using a medicine to get rid of mucus in your nose (decongestant). ?Avoiding the use of alcohol, medicines to help you relax, or certain pain medicines (narcotics). ?Losing weight, if needed. ?Changing your diet. ?Quitting smoking. ?Using a machine to open your airway while you  sleep, such as: ?An oral appliance. This is a mouthpiece that shifts your lower jaw forward. ?A CPAP device. This device blows air through a mask when you breathe out (exhale). ?An EPAP device. This has valves that you put in each nostril. ?A BIPAP device. This device blows air through a mask when you breathe in (inhale) and breathe out. ?Having surgery if other treatments do not work. ?Follow these instructions at home: ?Lifestyle ?Make changes that your doctor recommends. ?Eat a healthy diet. ?Lose weight if needed. ?Avoid alcohol, medicines to help you relax, and some pain medicines. ?Do not smoke or use any products that contain nicotine or tobacco. If you need help quitting, ask your doctor. ?General instructions ?Take over-the-counter and prescription medicines only as told by your doctor. ?If you were given a machine to use while you sleep, use it only as told by your doctor. ?If you are having surgery, make sure to tell your doctor you have sleep apnea. You may need to bring your device with you. ?Keep all follow-up visits. ?Contact a doctor if: ?The machine that you were given to use during sleep bothers you or does not seem to be working. ?You do not get better. ?You get worse. ?Get help right away if: ?Your chest hurts. ?You have trouble breathing in enough air. ?You have an uncomfortable feeling in your back, arms, or stomach. ?You have trouble talking. ?One side of your body feels weak. ?A part of your face is hanging down. ?These symptoms may be an emergency. Get help right away. Call your local emergency services (911 in the [place] ?Do not wait to see if the symptoms   will go away. ?Do not drive yourself to the hospital. ?Summary ?This condition affects breathing during sleep. ?The most common cause is a collapsed or blocked airway. ?The goal of treatment is to help you breathe normally while you sleep. ?This information is not intended to replace advice given to you by your health care provider. Make  sure you discuss any questions you have with your health care provider. ?Document Revised: 03/29/2021 Document Reviewed: 07/29/2020 ?Elsevier Patient Education ? 2022 [employer] ? ?

## 2021-09-11 NOTE — Progress Notes (Signed)
CM sent to [facility] 

## 2021-09-13 DIAGNOSIS — G4733 Obstructive sleep apnea (adult) (pediatric): Secondary | ICD-10-CM | POA: Diagnosis not present

## 2021-09-28 DIAGNOSIS — U071 COVID-19: Secondary | ICD-10-CM | POA: Diagnosis not present

## 2021-10-02 DIAGNOSIS — U071 COVID-19: Secondary | ICD-10-CM | POA: Diagnosis not present

## 2021-10-10 DIAGNOSIS — S6391XA Sprain of unspecified part of right wrist and hand, initial encounter: Secondary | ICD-10-CM | POA: Diagnosis not present

## 2022-01-09 ENCOUNTER — Emergency Department (HOSPITAL_BASED_OUTPATIENT_CLINIC_OR_DEPARTMENT_OTHER)
Admission: EM | Admit: 2022-01-09 | Discharge: 2022-01-09 | Disposition: A | Discharge status: Home / Self Care | Payer: Federal, State, Local not specified - PPO | Attending: Emergency Medicine | Admitting: Emergency Medicine

## 2022-01-09 ENCOUNTER — Other Ambulatory Visit (HOSPITAL_BASED_OUTPATIENT_CLINIC_OR_DEPARTMENT_OTHER): Payer: Self-pay

## 2022-01-09 ENCOUNTER — Encounter (HOSPITAL_BASED_OUTPATIENT_CLINIC_OR_DEPARTMENT_OTHER): Payer: Self-pay | Admitting: Obstetrics and Gynecology

## 2022-01-09 ENCOUNTER — Other Ambulatory Visit: Payer: Self-pay

## 2022-01-09 DIAGNOSIS — E1165 Type 2 diabetes mellitus with hyperglycemia: Secondary | ICD-10-CM | POA: Diagnosis not present

## 2022-01-09 DIAGNOSIS — L03113 Cellulitis of right upper limb: Secondary | ICD-10-CM

## 2022-01-09 DIAGNOSIS — M7989 Other specified soft tissue disorders: Secondary | ICD-10-CM | POA: Diagnosis not present

## 2022-01-09 LAB — CBC WITH DIFFERENTIAL/PLATELET
Abs Immature Granulocytes: 0.05 10*3/uL (ref 0.00–0.07)
Basophils Absolute: 0.1 10*3/uL (ref 0.0–0.1)
Basophils Relative: 1 %
Eosinophils Absolute: 0.3 10*3/uL (ref 0.0–0.5)
Eosinophils Relative: 3 %
HCT: 44.3 % (ref 36.0–46.0)
Hemoglobin: 14.3 g/dL (ref 12.0–15.0)
Immature Granulocytes: 1 %
Lymphocytes Relative: 23 %
Lymphs Abs: 2.4 10*3/uL (ref 0.7–4.0)
MCH: 29.3 pg (ref 26.0–34.0)
MCHC: 32.3 g/dL (ref 30.0–36.0)
MCV: 90.8 fL (ref 80.0–100.0)
Monocytes Absolute: 0.9 10*3/uL (ref 0.1–1.0)
Monocytes Relative: 8 %
Neutro Abs: 6.9 10*3/uL (ref 1.7–7.7)
Neutrophils Relative %: 64 %
Platelets: 306 10*3/uL (ref 150–400)
RBC: 4.88 MIL/uL (ref 3.87–5.11)
RDW: 13.3 % (ref 11.5–15.5)
WBC: 10.5 10*3/uL (ref 4.0–10.5)
nRBC: 0 % (ref 0.0–0.2)

## 2022-01-09 LAB — BASIC METABOLIC PANEL
Anion gap: 10 (ref 5–15)
BUN: 13 mg/dL (ref 6–20)
CO2: 24 mmol/L (ref 22–32)
Calcium: 9.3 mg/dL (ref 8.9–10.3)
Chloride: 105 mmol/L (ref 98–111)
Creatinine, Ser: 0.76 mg/dL (ref 0.44–1.00)
GFR, Estimated: >60 mL/min (ref >60)
Glucose, Bld: 101 mg/dL — ABNORMAL HIGH (ref 70–99)
Potassium: 4.2 mmol/L (ref 3.5–5.1)
Sodium: 139 mmol/L (ref 135–145)

## 2022-01-09 MED ORDER — OXYCODONE HCL 5 MG PO TABS
5.0000 mg | ORAL_TABLET | Freq: Four times a day (QID) | ORAL | 0 refills | Status: AC | PRN
  Filled 2022-01-09: qty 6, 2d supply, fill #0

## 2022-01-09 MED ORDER — DOXYCYCLINE HYCLATE 100 MG PO TABS
100.0000 mg | ORAL_TABLET | Freq: Once | ORAL | Status: AC
  Administered 2022-01-09: 100 mg via ORAL
  Filled 2022-01-09: qty 1

## 2022-01-09 MED ORDER — DOXYCYCLINE HYCLATE 100 MG PO CAPS
100.0000 mg | ORAL_CAPSULE | Freq: Two times a day (BID) | ORAL | 0 refills | Status: AC
  Filled 2022-01-09: qty 14, 7d supply, fill #0

## 2022-01-09 NOTE — ED Triage Notes (Signed)
Patient reports to the ER for cellulitis in the right arm. Patient reports she got bit by what she thought was a fly yesterday and it has since gotten red, hot, and swollen.  ?

## 2022-01-09 NOTE — ED Notes (Signed)
Patient verbalizes understanding of discharge instructions. Opportunity for questioning and answers were provided. Patient discharged from ED.  °

## 2022-01-09 NOTE — Discharge Instructions (Signed)
Please read and follow all provided instructions. ? ?Your diagnoses today include:  ?1. Cellulitis of right upper extremity   ? ?Tests performed today include: ?Vital signs. See below for your results today.  ? ?Medications prescribed:  ?Doxycycline - antibiotic ? ?You have been prescribed an antibiotic medicine: take the entire course of medicine even if you are feeling better. Stopping early can cause the antibiotic not to work. ? ?Oxycodone - narcotic pain medication ? ?DO NOT drive or perform any activities that require you to be awake and alert because this medicine can make you drowsy.  ? ?Take any prescribed medications only as directed.  ? ?Home care instructions:  ?Follow any educational materials contained in this packet. Keep affected area above the level of your heart when possible. Wash area gently twice a day with warm soapy water. Do not apply alcohol or hydrogen peroxide. Cover the area if it draining or weeping.  ? ?Follow-up instructions: ?See your doctor or return to the emergency department in 48 hours for recheck of your cellulitis. ? ?Return instructions:  ?Return to the Emergency Department if you have: ?Fever ?Worsening symptoms ?Worsening pain ?Worsening swelling ?Redness of the skin that moves away from the affected area, especially if it streaks away from the affected area  ?Any other emergent concerns ? ?Your vital signs today were: ?BP (!) 139/91 (BP Location: Left Arm)   Pulse 87   Temp 98.4 ?F (36.9 ?C)   Resp 16   SpO2 100%  ?If your blood pressure (BP) was elevated above 135/85 this visit, please have this repeated by your doctor within one month. ?-------------- ? ?

## 2022-01-09 NOTE — ED Provider Notes (Signed)
?[facility] EM[facility]PT ?Provider Note ? ? ?CSN: [id number] ?Arrival date & time: 01/09/22  1156 ? ?  ? ?History ? ?Chief Complaint  ?Patient presents with  ? Cellulitis  ? ? ?Rhonda Simmons is a 61 y.o. female. ? ?Patient with history of diabetes presents to the emergency department today for evaluation of right upper extremity redness and swelling.  Symptoms started early yesterday.  She thinks that she was bitten by a fly because she had a sharp pain and a small red bump at onset.  Over the course of several hours last evening, she developed redness and warmth of the right upper arm.  No fevers or chills.  No nausea or vomiting.  Area is very sore and tender.  No drainage.  She denies tick bite. ? ? ?  ? ?Home Medications ?Prior to Admission medications   ?Medication Sig Start Date End Date Taking? Authorizing Provider  ?doxycycline (VIBRAMYCIN) 100 MG capsule Take 1 capsule (100 mg total) by mouth 2 (two) times daily. 01/09/22  Yes Renne Crigler, PA-C  ?DULoxetine (CYMBALTA) 60 MG capsule Take 60 mg by mouth daily.  03/08/18  Yes [provider]  ?hydrOXYzine (ATARAX/VISTARIL) 25 MG tablet Take 25 mg by mouth every 6 (six) hours as needed for itching.  01/28/14  Yes [provider]  ?oxyCODONE (OXY IR/ROXICODONE) 5 MG immediate release tablet Take 1 tablet (5 mg total) by mouth every 6 (six) hours as needed for severe pain. 01/09/22  Yes Renne Crigler, PA-C  ?phentermine (ADIPEX-P) 37.5 MG tablet Take 37.5 mg by mouth daily. 01/20/20  Yes [provider]  ?Semaglutide,0.25 or 0.5MG /DOS, (OZEMPIC, 0.25 OR 0.5 MG/DOSE,) 2 MG/1.5ML SOPN Inject 0.5 mg into the skin once a week.   Yes [provider]  ?ALPRAZolam (XANAX) 0.25 MG tablet Take 0.25 mg by mouth daily as needed for anxiety.  06/10/18   [provider]  ?BD PEN NEEDLE NANO 2ND GEN 32G X 4 MM MISC USE WITH VICTOZA** 12/07/19   [provider]  ?EPINEPHrine (EPIPEN 2-PAK) 0.3 mg/0.3 mL IJ SOAJ  injection Inject 0.3 mLs (0.3 mg total) into the muscle once as needed (for severe allergic reaction). CAll 911 immediately if you have to use this medicine 04/20/14   Antony Madura, PA-C  ?gabapentin (NEURONTIN) 100 MG capsule Take 1 capsule (100 mg total) by mouth 3 (three) times daily. 07/04/21 08/03/21  Adron Bene, MD  ?   ? ?Allergies    ?Other, Acetaminophen-pamabrom, Compazine, Fexofenadine, Midol [aspirin-cinnamedrine-caffeine], and Naproxen sodium   ? ?Review of Systems   ?Review of Systems ? ?Physical Exam ?Updated Vital Signs ?BP (!) 139/91 (BP Location: Left Arm)   Pulse 87   Temp 98.4 ?F (36.9 ?C)   Resp 16   SpO2 100%  ? ?Physical Exam ?Vitals and nursing note reviewed.  ?Constitutional:   ?   General: She is not in acute distress. ?   Appearance: She is well-developed.  ?HENT:  ?   Head: Normocephalic and atraumatic.  ?   Right Ear: External ear normal.  ?   Left Ear: External ear normal.  ?   Nose: Nose normal.  ?Eyes:  ?   Conjunctiva/sclera: Conjunctivae normal.  ?Cardiovascular:  ?   Rate and Rhythm: Normal rate and regular rhythm.  ?   Heart sounds: No murmur heard. ?Pulmonary:  ?   Effort: No respiratory distress.  ?   Breath sounds: No wheezing, rhonchi or rales.  ?Abdominal:  ?   Palpations:  Abdomen is soft.  ?   Tenderness: There is no abdominal tenderness. There is no guarding or rebound.  ?Musculoskeletal:  ?   Cervical back: Normal range of motion and neck supple.  ?   Right lower leg: No edema.  ?   Left lower leg: No edema.  ?Skin: ?   General: Skin is warm and dry.  ?   Findings: No rash.  ?   Comments: Patient with well demarcated area of cellulitis, redness and warmth to the right upper extremity without lymphangitic changes.  There is a small satellite area where the patient was originally bitten.  Area of erythema is indurated.  No signs of abscess.  Distal sensation and motor intact.  No shoulder or elbow pain.  ?Neurological:  ?   General: No focal deficit present.  ?    Mental Status: She is alert. Mental status is at baseline.  ?   Motor: No weakness.  ?Psychiatric:     ?   Mood and Affect: Mood normal.  ? ? ? ? ? ? ?ED Results / Procedures / Treatments   ?Labs ?(all labs ordered are listed, but only abnormal results are displayed) ?Labs Reviewed  ?BASIC METABOLIC PANEL - Abnormal; Notable for the following components:  ?    Result Value  ? Glucose, Bld 101 (*)   ? All other components within normal limits  ?CBC WITH DIFFERENTIAL/PLATELET  ? ? ?EKG ?None ? ?Radiology ?No results found. ? ?Procedures ?Procedures  ? ? ?Medications Ordered in ED ?Medications  ?doxycycline (VIBRA-TABS) tablet 100 mg (100 mg Oral Given 01/09/22 1444)  ? ? ?ED Course/ Medical Decision Making/ A&P ?  ? ?Patient seen and examined. History obtained directly from patient. Work-up including labs, imaging, EKG ordered in triage, if performed, were reviewed.   ? ?Labs/EKG: Independently reviewed and interpreted.  This included: CBC with upper limit of normal white blood cell count 10.5, normal hemoglobin otherwise unremarkable; BMP with glucose 101 otherwise unremarkable. ? ?Imaging: Considered x-ray but no concern for foreign body and no crepitus on exam. ? ?Medications/Fluids: Ordered: P.o. doxycycline.  Patient requesting pain medication but she is driving home.  Considered parenteral antibiotics however patient without fever or other signs of sepsis.  Normal white blood cell count.  Feel oral antibiotics appropriate at this point ? ?Most recent vital signs reviewed and are as follows: ?BP (!) 139/91 (BP Location: Left Arm)   Pulse 87   Temp 98.4 ?F (36.9 ?C)   Resp 16   SpO2 100%  ? ?Initial impression: Cellulitis of right upper extremity.  Extension of erythema marked with a skin marker. ? ?Reviewed pertinent lab work and imaging with patient at bedside. Questions answered.  ? ?Plan: Discharge to home.  ? ?Prescriptions written for: P.o. doxycycline, #6 oxycodone 5 mg for pain per patient  request. ? ?Other home care instructions discussed: Elevation, OTC meds as well ? ?ED return instructions discussed: Pt urged to return with worsening pain, worsening swelling, expanding area of redness or streaking up extremity, fever, or any other concerns.  ? ?Follow-up instructions discussed: Patient encouraged to follow-up with their PCP in 2 days or return to the emergency department for recheck. ? ?                        ?Medical Decision Making ?Amount and/or Complexity of Data Reviewed ?Labs: ordered. ? ?Risk ?Prescription drug management. ? ? ?Patient with right upper extremity swelling, possibly started with  an insect bite.  No tick bite and this does not have the typical bull's-eye appearance with Lyme disease.  Most consistent with cellulitis at this time.  Patient is afebrile.  White blood cell count upper limit of normal.  She has a history of diabetes but glucose is 101 today.  She appears well, nontoxic.  Considered allergic reaction, necrotizing fasciitis, but feel that this is low likelihood at this point. ? ? ? ? ? ? ? ?Final Clinical Impression(s) / ED Diagnoses ?Final diagnoses:  ?Cellulitis of right upper extremity  ? ? ?Rx / DC Orders ?ED Discharge Orders   ? ?      Ordered  ?  doxycycline (VIBRAMYCIN) 100 MG capsule  2 times daily       ? 01/09/22 1548  ?  oxyCODONE (OXY IR/ROXICODONE) 5 MG immediate release tablet  Every 6 hours PRN       ? 01/09/22 1548  ? ?  ?  ? ?  ? ? ?  ?Renne Crigler, PA-C ?01/09/22 1556 ? ?  ?Vanetta Mulders, MD ?01/13/22 1520 ? ?

## 2022-01-11 DIAGNOSIS — Z1272 Encounter for screening for malignant neoplasm of vagina: Secondary | ICD-10-CM | POA: Diagnosis not present

## 2022-01-11 DIAGNOSIS — Z6841 Body Mass Index (BMI) 40.0 and over, adult: Secondary | ICD-10-CM | POA: Diagnosis not present

## 2022-01-11 DIAGNOSIS — Z1231 Encounter for screening mammogram for malignant neoplasm of breast: Secondary | ICD-10-CM | POA: Diagnosis not present

## 2022-01-11 DIAGNOSIS — L03113 Cellulitis of right upper limb: Secondary | ICD-10-CM | POA: Diagnosis not present

## 2022-01-11 DIAGNOSIS — Z01419 Encounter for gynecological examination (general) (routine) without abnormal findings: Secondary | ICD-10-CM | POA: Diagnosis not present

## 2022-02-28 DIAGNOSIS — I10 Essential (primary) hypertension: Secondary | ICD-10-CM | POA: Diagnosis not present

## 2022-02-28 DIAGNOSIS — E782 Mixed hyperlipidemia: Secondary | ICD-10-CM | POA: Diagnosis not present

## 2022-03-09 DIAGNOSIS — N760 Acute vaginitis: Secondary | ICD-10-CM | POA: Diagnosis not present

## 2022-03-09 DIAGNOSIS — N390 Urinary tract infection, site not specified: Secondary | ICD-10-CM | POA: Diagnosis not present

## 2022-03-12 ENCOUNTER — Ambulatory Visit: Payer: Federal, State, Local not specified - PPO | Admitting: Neurology

## 2022-03-29 DIAGNOSIS — I10 Essential (primary) hypertension: Secondary | ICD-10-CM | POA: Diagnosis not present

## 2022-03-29 DIAGNOSIS — E782 Mixed hyperlipidemia: Secondary | ICD-10-CM | POA: Diagnosis not present

## 2022-03-29 DIAGNOSIS — F419 Anxiety disorder, unspecified: Secondary | ICD-10-CM | POA: Diagnosis not present

## 2022-03-29 DIAGNOSIS — F324 Major depressive disorder, single episode, in partial remission: Secondary | ICD-10-CM | POA: Diagnosis not present

## 2022-04-20 DIAGNOSIS — R238 Other skin changes: Secondary | ICD-10-CM | POA: Diagnosis not present

## 2022-04-20 DIAGNOSIS — R21 Rash and other nonspecific skin eruption: Secondary | ICD-10-CM | POA: Diagnosis not present

## 2022-05-16 ENCOUNTER — Ambulatory Visit: Payer: Federal, State, Local not specified - PPO | Admitting: Neurology

## 2022-06-05 ENCOUNTER — Ambulatory Visit: Payer: Federal, State, Local not specified - PPO | Admitting: Neurology

## 2022-06-20 DIAGNOSIS — H5203 Hypermetropia, bilateral: Secondary | ICD-10-CM | POA: Diagnosis not present

## 2022-06-20 DIAGNOSIS — H35351 Cystoid macular degeneration, right eye: Secondary | ICD-10-CM | POA: Diagnosis not present

## 2022-06-20 DIAGNOSIS — H40033 Anatomical narrow angle, bilateral: Secondary | ICD-10-CM | POA: Diagnosis not present

## 2022-06-20 DIAGNOSIS — H04123 Dry eye syndrome of bilateral lacrimal glands: Secondary | ICD-10-CM | POA: Diagnosis not present

## 2022-07-31 DIAGNOSIS — H02841 Edema of right upper eyelid: Secondary | ICD-10-CM | POA: Diagnosis not present

## 2022-08-07 DIAGNOSIS — H0011 Chalazion right upper eyelid: Secondary | ICD-10-CM | POA: Diagnosis not present

## 2022-08-20 DIAGNOSIS — H0011 Chalazion right upper eyelid: Secondary | ICD-10-CM | POA: Diagnosis not present

## 2022-08-29 ENCOUNTER — Ambulatory Visit (INDEPENDENT_AMBULATORY_CARE_PROVIDER_SITE_OTHER): Payer: Federal, State, Local not specified - PPO | Admitting: Podiatry

## 2022-08-29 ENCOUNTER — Other Ambulatory Visit: Payer: Self-pay | Admitting: Podiatry

## 2022-08-29 ENCOUNTER — Ambulatory Visit (INDEPENDENT_AMBULATORY_CARE_PROVIDER_SITE_OTHER): Payer: Federal, State, Local not specified - PPO

## 2022-08-29 DIAGNOSIS — Q666 Other congenital valgus deformities of feet: Secondary | ICD-10-CM

## 2022-08-29 DIAGNOSIS — M722 Plantar fascial fibromatosis: Secondary | ICD-10-CM

## 2022-08-29 DIAGNOSIS — L600 Ingrowing nail: Secondary | ICD-10-CM

## 2022-08-29 MED ORDER — TRIAMCINOLONE ACETONIDE 10 MG/ML IJ SUSP
10.0000 mg | Freq: Once | INTRAMUSCULAR | Status: AC
  Administered 2022-08-29: 10 mg

## 2022-08-29 NOTE — Patient Instructions (Signed)

## 2022-08-29 NOTE — Progress Notes (Signed)
Subjective:   Patient ID: Rhonda Simmons, female   DOB: 61 y.o.   MRN: [id number]   HPI Patient presents with a lot of pain in the second and third digits right lateral borders and pain in the right arch.  Also states she never got her orthotics from 2019   ROS      Objective:  Physical Exam  Neurovascular status was found to be intact muscle strength found to be adequate with patient found to have incurvated second and third digits lateral side painful when pressed slight redness noted no active drainage noted.  Patient is found to have discomfort in the mid arch area right with inflammation fluid and moderate depression of the arch noted right     Assessment:  Ingrown toenail deformity second and third right along with plantar fascial symptomatology right flatfoot deformity     Plan:  H&P reviewed all conditions and did discuss nailbed removal.  Patient wants to have this done I did explain procedure risk and I went ahead and I anesthetized the right second and third digits and she then signed consent form sterile prep supplied using sterile instrumentation remove the lateral border second and third digits exposed matrix applied phenol 3 applications 30 seconds followed by alcohol lavage sterile dressing then reviewed her x-ray did a mid arch injection right 3 mg Kenalog 5 mg Xylocaine and we will see her back in 3 weeks see results and talk about orthotics and hopefully we can find a pair she has and if not  Be made  X-rays indicate that there is moderate depression of the arch with spurs no indications of other pathology

## 2022-09-05 ENCOUNTER — Encounter: Payer: Self-pay | Admitting: Podiatry

## 2022-09-05 ENCOUNTER — Telehealth: Payer: Self-pay | Admitting: *Deleted

## 2022-09-05 ENCOUNTER — Ambulatory Visit (INDEPENDENT_AMBULATORY_CARE_PROVIDER_SITE_OTHER): Payer: Federal, State, Local not specified - PPO | Admitting: Podiatry

## 2022-09-05 ENCOUNTER — Other Ambulatory Visit: Payer: Self-pay | Admitting: Podiatry

## 2022-09-05 DIAGNOSIS — L03031 Cellulitis of right toe: Secondary | ICD-10-CM

## 2022-09-05 MED ORDER — DOXYCYCLINE HYCLATE 100 MG PO TABS: 100.0000 mg | ORAL_TABLET | Freq: Two times a day (BID) | ORAL | 1 refills | Status: AC

## 2022-09-05 NOTE — Telephone Encounter (Signed)
Patient is having pain and swelling, a lot of pus drainage after the post procedural toenail removal completed, please advise

## 2022-09-05 NOTE — Telephone Encounter (Signed)
Called her antibiotic in

## 2022-09-05 NOTE — Progress Notes (Signed)
Subjective:   Patient ID: Rhonda Simmons, female   DOB: 62 y.o.   MRN: [id number]   HPI Patient presents stating she has not started her antibiotic but she still has soreness in his right second digit   ROS      Objective:  Physical Exam  Neuro vascular status intact with some crusted tissue on the lateral side of the right second toe with low-grade localized redness around this area no proximal edema erythema or drainage was noted     Assessment:  Localized paronychia infection right second digit lateral border     Plan:  I have encouraged her to start her antibiotics that were called in today and if symptoms were to become more of a problem then I will open that area back up and I gave strict instructions of any systemic signs of infection or proximal edema erythema drainage were to occur I want her to let us know immediately

## 2022-09-06 ENCOUNTER — Other Ambulatory Visit: Payer: Self-pay | Admitting: Podiatry

## 2022-09-06 ENCOUNTER — Telehealth: Payer: Self-pay | Admitting: Podiatry

## 2022-09-06 ENCOUNTER — Ambulatory Visit: Payer: Federal, State, Local not specified - PPO | Admitting: Podiatry

## 2022-09-06 MED ORDER — CEPHALEXIN 500 MG PO CAPS: 500.0000 mg | ORAL_CAPSULE | Freq: Three times a day (TID) | ORAL | 1 refills | Status: AC

## 2022-09-06 NOTE — Telephone Encounter (Signed)
Pt called and the antibiotic that was sent in yesterday pt cannot take it makes her sick and gives her a headache. Could you please send in a different antibiotic. Also she is asking what can she do for the pain it is waking her up at night  with burning pain and throbbing very bad. She does not know why she is having all the burning  pain and throbbing and did mention it yesterday at her appt. Please advise.

## 2022-09-06 NOTE — Telephone Encounter (Signed)
I called her in another antibiotic. She can take extra strength tylenol or advil before bed. If not better by Monday I want to see her again

## 2022-09-06 NOTE — Telephone Encounter (Signed)
I called pt and she said thank you for the antibiotic but she has been taking the tylenol and ibuprofen and they are not working for the pain. I told her to get the antibiotic and see if that helps but if she is still having the pain to call Monday morning and we can get her in per your request.

## 2022-09-07 NOTE — Telephone Encounter (Signed)
I talked to her

## 2022-09-07 NOTE — Telephone Encounter (Signed)
Patient is having pain,soreness, starting at ankle, inside of  leg going up into her thigh, calf, knee which stated one day ago, has started the antibiotic prescribed,please advise.

## 2022-09-18 NOTE — Progress Notes (Deleted)
Patient no showed appointment

## 2022-09-19 ENCOUNTER — Ambulatory Visit: Payer: Federal, State, Local not specified - PPO | Admitting: Podiatry

## 2022-09-19 ENCOUNTER — Encounter: Payer: Self-pay | Admitting: Podiatry

## 2022-09-19 ENCOUNTER — Ambulatory Visit (INDEPENDENT_AMBULATORY_CARE_PROVIDER_SITE_OTHER): Payer: Federal, State, Local not specified - PPO

## 2022-09-19 ENCOUNTER — Other Ambulatory Visit: Payer: Federal, State, Local not specified - PPO

## 2022-09-19 DIAGNOSIS — R52 Pain, unspecified: Secondary | ICD-10-CM | POA: Diagnosis not present

## 2022-09-19 DIAGNOSIS — L600 Ingrowing nail: Secondary | ICD-10-CM

## 2022-09-19 DIAGNOSIS — M722 Plantar fascial fibromatosis: Secondary | ICD-10-CM

## 2022-09-19 DIAGNOSIS — Q666 Other congenital valgus deformities of feet: Secondary | ICD-10-CM

## 2022-09-19 NOTE — Progress Notes (Signed)
Subjective:   Patient ID: Rhonda Simmons, female   DOB: 62 y.o.   MRN: [id number]   HPI Patient states it is a little bit better but still has redness and soreness in the second digit right and is concerned about pathology associated with it.  Patient states it is very tender states the arch seems to be doing better and is here also to have orthotics redone   ROS      Objective:  Physical Exam  Ocular status intact slight redness in the second digit right foot localized no proximal edema erythema drainage noted mild discomfort with pressure with significant flattening of the arch inflammation of the fascia which is under good condition currently     Assessment:  Fasciitis right improved toe that still has some redness in it with concerns and no other pathology noted     Plan:  H&P reviewed all conditions and went ahead today and I took precautionary x-ray and started patient with continued soaks and advised I think the redness will go away on its own but may require removal of the nail if it does not.  Did go ahead and casted for new orthotics as her last pair was lost and we will get her into those with all instructions today

## 2022-09-20 ENCOUNTER — Other Ambulatory Visit: Payer: Self-pay | Admitting: Podiatry

## 2022-09-20 DIAGNOSIS — M722 Plantar fascial fibromatosis: Secondary | ICD-10-CM

## 2022-09-20 DIAGNOSIS — L600 Ingrowing nail: Secondary | ICD-10-CM

## 2022-09-20 DIAGNOSIS — R52 Pain, unspecified: Secondary | ICD-10-CM

## 2022-10-02 DIAGNOSIS — I10 Essential (primary) hypertension: Secondary | ICD-10-CM | POA: Diagnosis not present

## 2022-10-02 DIAGNOSIS — Z23 Encounter for immunization: Secondary | ICD-10-CM | POA: Diagnosis not present

## 2022-10-02 DIAGNOSIS — F419 Anxiety disorder, unspecified: Secondary | ICD-10-CM | POA: Diagnosis not present

## 2022-10-02 DIAGNOSIS — E782 Mixed hyperlipidemia: Secondary | ICD-10-CM | POA: Diagnosis not present

## 2022-10-09 DIAGNOSIS — Z45321 Encounter for adjustment and management of cochlear device: Secondary | ICD-10-CM | POA: Diagnosis not present

## 2022-10-09 DIAGNOSIS — H903 Sensorineural hearing loss, bilateral: Secondary | ICD-10-CM | POA: Diagnosis not present

## 2022-10-15 ENCOUNTER — Telehealth: Payer: Self-pay | Admitting: Neurology

## 2022-10-15 NOTE — Telephone Encounter (Signed)
Pt is calling. Stated she wants Dr. Brett Fairy know that she is still not able to wear CPAP machine at night because of the mask.

## 2022-10-16 ENCOUNTER — Ambulatory Visit: Payer: Federal, State, Local not specified - PPO | Admitting: Neurology

## 2022-11-06 ENCOUNTER — Telehealth: Payer: Self-pay | Admitting: Podiatry

## 2022-11-06 NOTE — Telephone Encounter (Signed)
Called patient to advise that her appointment had to be moved from 2:45pm to 12:15pm on 11/07/2022. Left voicemail

## 2022-11-07 ENCOUNTER — Encounter: Payer: Self-pay | Admitting: Podiatry

## 2022-11-07 ENCOUNTER — Ambulatory Visit (INDEPENDENT_AMBULATORY_CARE_PROVIDER_SITE_OTHER): Payer: Federal, State, Local not specified - PPO | Admitting: Podiatry

## 2022-11-07 DIAGNOSIS — M722 Plantar fascial fibromatosis: Secondary | ICD-10-CM | POA: Diagnosis not present

## 2022-11-07 DIAGNOSIS — L600 Ingrowing nail: Secondary | ICD-10-CM

## 2022-11-07 NOTE — Progress Notes (Signed)
Subjective:   Patient ID: Rhonda Simmons, female   DOB: 61 y.o.   MRN: [id number]   HPI Patient presents with concerns about ingrown toenail right big toe some redness in the right second toe that she is not sure about and heels doing much better   ROS      Objective:  Physical Exam  Neurovascular status intact with patient's right hallux nail slightly incurvated but localized with the right second toe showing slight mild redness but no erythema edema or drainage proximal and nailbed that still healing from previous surgery and significant reduction pain in the right heel     Assessment:  Mild ingrown toenail right hallux with slight redness right second toe but localized and normal for recovery     Plan:  H&P reviewed discussed removal of the toenail border organ to hold off currently and I recommended soaks.  May need to remove the corner of the nail do not see any other issues currently

## 2022-11-16 ENCOUNTER — Telehealth: Payer: Self-pay | Admitting: Podiatry

## 2022-11-16 DIAGNOSIS — H903 Sensorineural hearing loss, bilateral: Secondary | ICD-10-CM | POA: Diagnosis not present

## 2022-11-16 NOTE — Telephone Encounter (Signed)
Patient called she would like to speak with Christan , she wants the 3/4 inserts and she wants them in black material not the blue material ? When she came in on 3/6 she told them that they were not made correctly. Please call Monday

## 2022-11-26 DIAGNOSIS — H903 Sensorineural hearing loss, bilateral: Secondary | ICD-10-CM | POA: Diagnosis not present

## 2023-01-03 ENCOUNTER — Ambulatory Visit: Payer: Federal, State, Local not specified - PPO | Admitting: Neurology

## 2023-01-03 ENCOUNTER — Encounter: Payer: Self-pay | Admitting: Neurology

## 2023-01-16 DIAGNOSIS — R3915 Urgency of urination: Secondary | ICD-10-CM | POA: Diagnosis not present

## 2023-01-16 DIAGNOSIS — N760 Acute vaginitis: Secondary | ICD-10-CM | POA: Diagnosis not present

## 2023-01-21 ENCOUNTER — Ambulatory Visit: Payer: Federal, State, Local not specified - PPO | Admitting: Neurology

## 2023-01-21 ENCOUNTER — Encounter: Payer: Self-pay | Admitting: Neurology

## 2023-01-22 DIAGNOSIS — Z01419 Encounter for gynecological examination (general) (routine) without abnormal findings: Secondary | ICD-10-CM | POA: Diagnosis not present

## 2023-01-22 DIAGNOSIS — Z13820 Encounter for screening for osteoporosis: Secondary | ICD-10-CM | POA: Diagnosis not present

## 2023-01-22 DIAGNOSIS — Z1231 Encounter for screening mammogram for malignant neoplasm of breast: Secondary | ICD-10-CM | POA: Diagnosis not present

## 2023-01-22 DIAGNOSIS — R6882 Decreased libido: Secondary | ICD-10-CM | POA: Diagnosis not present

## 2023-02-19 ENCOUNTER — Ambulatory Visit: Payer: Federal, State, Local not specified - PPO | Admitting: Neurology

## 2023-02-19 ENCOUNTER — Encounter: Payer: Self-pay | Admitting: Neurology

## 2023-02-19 VITALS — BP 133/81 | HR 90 | Ht 61.0 in | Wt 222.2 lb

## 2023-02-19 DIAGNOSIS — Z9621 Cochlear implant status: Secondary | ICD-10-CM | POA: Diagnosis not present

## 2023-02-19 DIAGNOSIS — Z9989 Dependence on other enabling machines and devices: Secondary | ICD-10-CM

## 2023-02-19 DIAGNOSIS — G4733 Obstructive sleep apnea (adult) (pediatric): Secondary | ICD-10-CM | POA: Diagnosis not present

## 2023-02-19 DIAGNOSIS — H5713 Ocular pain, bilateral: Secondary | ICD-10-CM

## 2023-02-19 NOTE — Progress Notes (Signed)
Provider:  Melvyn Novas, MD  Primary Care Physician:  Noberto Retort, MD [phone] [place] [address]     Referring Provider: Noberto Retort, Md [address] [place],  [place] [address]          Chief Complaint according to patient   Patient presents with:                HISTORY OF PRESENT ILLNESS:  Rhonda Simmons is a 62 y.o. female patient who is here for revisit 02/19/2023 for OSA, Migraine and .  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 Wakes up panicked. Headgear interfered with cochlear implant.    09-11-2021;09-11-2021: Rv for Rhonda Simmons, just 62 years old. She brought her newish CPAP to this appointment , has been happy with her cochlear implant and is still not using a different head gear.  She has not used her CPAP in a while now.  Her BMI is slightly reduced 41.8 kg per metered squared.  Her blood pressure today is 153/98 which is elevated.  Her Epworth sleepiness score is up to 15 points again that she is not longer using CPAP.  Medication list is fairly identical to before except that she is no longer taking the insulin pen and Neurontin she also because of biotin as a nutritional supplement.  She has started on Ozempic for weight loss.  No longer on phentermine.   We need to address the non compliance with CPAP :Mask, headgear, cushions, filters, heated tubing and water chamber Sleep apnea patient with cochlear implant , needs a fitting for headgear not touching the are of the implant. she will not receive new supplies after not using PAP for over 6 months.      I will ask for a new download, just seeing when the machine was used last: Epworth is 15/ 24 now.        RV 03-09-2021: Rv for Rhonda Simmons.  Rhonda Simmons has not been able to use her CPAP  since May because she has been a cochlear implant and the device and the paired headgear are actually irritating 1 another.  So she has been unable to tolerate the interface.  I think we probably could do the very best by using a other dental device or a DreamWear with a different halo.  These may not push directly at the area of the temple. Unfortunately, she has not contacted DME- and now would be deemed non compliant. Her BMI would exclude Inspire, and she wonders about a dental device.  She is interested in bariatric surgery, sleeve.     RV : 06-29-2020: I have the pleasure of seeing Rhonda Simmons today, meanwhile 63 year old Caucasian female with a severe hearing impairment.  Her mom ad her daughter have  also been my patients.  She presents to follow-up has been using a CPAP machine in the past but was advised that it is not good for her to use his CPAP machine while she had mold infestation in her home.  She has been waiting on some replacement parts I guess these are filters.  I think she is safe to use his CPAP machine if it was completely cleaned.  She also needs  a replacement mask interface and states that she was given a large size when she actually needs a medium or small. I have reviewed the data from August 2021 the last time she had used her CPAP machine and at the time she reached a residual AHI of 2.9/h the pressure at the 95th percentile was 8.9 cmH2O she is using an AutoSet with a serial #[id number] is a minimum pressure of 5 and a maximum pressure of 10 cmH2O and 3 cm EPR.  The machine has been comfortable to use but she needs a new mask.  She has not use the machine now for about 2 months and due to that her Epworth Sleepiness Scale is higher today at 13 out of 24 possible points she is also much more fatigued but endorsed 40 points on the fatigue severity score.  Part of the stressors is not just the mold infestation in her home and the necessary renovation and other people but also  that her mom has moved to hospice.   RV on 08-18-2019 for this long time established patient with severe hearing loss. Rhonda Simmons had discussed in her last visit with me that she may prefer a dental device for the treatment of sleep apnea but the cost of about $800 were too expensive for her.  The patient has a history of degenerative disc disease in the cervical spine, obesity, dysesthesia of multiple sites, intractable migraine and obstructive sleep apnea. She also has TMJ pain which can make a dental device trickier to manufacture.  While she recovered from hip surgery ( 06-12-2019)  she became poorly compliant with CAP as she slept on the sofa-  She is still in PT.   However due to the lack of alternative in the treatment of obstructive sleep apnea we may have to return to see obstructive sleep apnea treatment by CPAP.  The last time the patient used her machine was in October at the time she used it on average for 7 hours 5 minutes, she is using an AutoSet with a minimum pressure of 5 and a maximum pressure of 10 cmH2O with 3 cm EPR and her AHI was 3.6.  There were almost equal central and obstructive apneas present.  She endorsed the Epworth Sleepiness Scale today at 15 which is a higher number.   She has had a lot of stress currently , her daughter has emancipated herself  from her parents, but she is not even calling her after surgery or for her birthday. There is a boyfriend the parents did not approve of.       I have the pleasure of meeting today with Rhonda Simmons, a 62 year old longtime established female patient.  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  She has a lifelong hearing deficit.  She has a history of waxing and waning migrainous headaches in the past, those have improved.  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  She also developed muscle aches and pains and was suspected to have polymyalgia rheumatica but further laboratory tests have not confirmed that.  However she felt much better while on steroids and her pain discomfort and ache returned after the steroids were weaned off.  She will no longer be followed by rheumatology, gave her diclofenac . PA at the orthopedics office filled tizanidine.  She has severe hip pain, and is too heavy for the surgery.    Rhonda Simmons used her CPAP 29 out of 30 days with a compliance of 97% for time with an average of 8 hours and 6 minutes.  CPAP remains set at 7 cm water pressure with 3 cm EPR her 95th percentile pressure is currently at 12.6, her AHI is 4.0 it seems that all apneas are obstructive in nature.  She is using a nasal pillow interface.  I would like to increase the set pressure from 6-10 cmH2O.     03-26-2018;  Rhonda Simmons is a 62 y.o. female , seen here as in a referral  from her optometrist , Pauline Aus , OD for  "terrible migraine headaches " and " I feel tired all the time". It was her eye doctor that saw her for sharp pains that seem to be located behind the eye, headaches, vision impairment this shadow and overlapping of visual field. All decreased acuity and dry eyes bilaterally. Medication list includes Cymbalta, Excedrin and Motrin.   I reviewed her ophthalmology records she has a corrected vision is 20/50 on the right, 20/30 on the right. She was suspected to develop astigmatism, hyperopia, presbyopia. Nuclear cataract at this time not mature enough for surgical procedure. Headaches which could be either related but the patient cannot have an MRI because of cochlear implants. And she has a small cyst at the temporal fovea.   Chief complaint according to patient : Rhonda Simmons reports that she has had headaches nearly every morning when she wakes up, associated with neck stiffness, nausea and at times vomiting. She is status  post anterior cervical fusion and does have a titanium rod implanted. She has had headaches for a long time but not to this intensity, not of the same quality and not of the same frequency. She often feels as if there is pressure applied on the eyeball and she actually saw ophthalmology, and needs to return for visual field examination. She states that beginning cataracts were suspected. She has TMJ and bruxism.     Review of Systems: Out of a complete 14 system review, the patient complains of only the following symptoms, and all other reviewed systems are negative.:  Fatigue, sleepiness , snoring, sad.  How likely are you to doze in the following situations: 0 = not likely, 1 = slight chance, 2 = moderate chance, 3 = high chance   Sitting and Reading? Watching Television? Sitting inactive in a public place (theater or meeting)? As a passenger in a car for an hour without a break? Lying down in the afternoon when circumstances permit? Sitting and talking to someone? Sitting quietly after lunch without alcohol? In a car, while stopped for a few minutes in traffic?   Total = 14/ 24 points   FSS endorsed at 52/ 63 points.   Social History   Socioeconomic History   Marital status: Married    Spouse name: Not on file   Number of children: 2   Years of education: assoc   Highest education level: Not on file  Occupational History   Occupation: caretaker    Comment: for parents  Tobacco Use   Smoking status: Never   Smokeless tobacco: Never  Vaping Use   Vaping Use: Never used  Substance and Sexual Activity   Alcohol use: Yes    Comment: occas   Drug use: Not Currently   Sexual activity: Not on file  Other Topics  Concern   Not on file  Social History Narrative   Not on file   Social Determinants of Health   Financial Resource Strain: Not on file  Food Insecurity: Not on file  Transportation Needs: Not on file  Physical Activity: Not on file  Stress: Not on file   Social Connections: Not on file    Family History  Problem Relation Age of Onset   Alzheimer's disease Mother    Alzheimer's disease Father     Past Medical History:  Diagnosis Date   Asthma    pt denies   Chronic bronchitis    Dr. 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   History of carpal tunnel syndrome    Hyperlipidemia    Migraines    Neuropathy    pt unaware   OA (osteoarthritis)    Obesity    OSA (obstructive sleep apnea)    Plantar fasciitis, right    Umbilical hernia     Past Surgical History:  Procedure Laterality Date   ABDOMINAL HYSTERECTOMY     CARPAL TUNNEL RELEASE Right    CESAREAN SECTION     x2   COCHLEAR IMPLANT Left    COCHLEAR IMPLANT REVISION Left 01/2016   COLONOSCOPY     DENTAL SURGERY     NECK SURGERY     Rods and screws placed   SHOULDER ARTHROSCOPY Right    TOTAL HIP ARTHROPLASTY Left 06/12/2019   Procedure: TOTAL HIP ARTHROPLASTY ANTERIOR APPROACH;  Surgeon: Jodi Geralds, MD;  Location: [facility];  Service: Orthopedics;  Laterality: Left;   UPPER GI ENDOSCOPY       Current Outpatient Medications on File Prior to Visit  Medication Sig Dispense Refill   ALPRAZolam (XANAX) 0.25 MG tablet Take 0.25 mg by mouth daily as needed for anxiety.      BD PEN NEEDLE NANO 2ND GEN 32G X 4 MM MISC USE WITH VICTOZA**     cephALEXin (KEFLEX) 500 MG capsule Take 1 capsule (500 mg total) by mouth 3 (three) times daily. (Patient not taking: Reported on 02/19/2023) 30 capsule 1   doxycycline (VIBRA-TABS) 100 MG tablet Take 1 tablet (100 mg total) by mouth 2 (two) times daily. 20 tablet 1   doxycycline (VIBRAMYCIN) 100 MG capsule Take 1 capsule (100 mg total) by mouth 2 (two) times daily. 14 capsule 0   DULoxetine (CYMBALTA) 60 MG capsule Take 60 mg by mouth daily.   6   EPINEPHrine (EPIPEN 2-PAK) 0.3  mg/0.3 mL IJ SOAJ injection Inject 0.3 mLs (0.3 mg total) into the muscle once as needed (for severe allergic reaction). CAll 911 immediately if you have to use this medicine 1 Device 0   hydrOXYzine (ATARAX/VISTARIL) 25 MG tablet Take 25 mg by mouth every 6 (six) hours as needed for itching.      oxyCODONE (OXY IR/ROXICODONE) 5 MG immediate release tablet Take 1 tablet (5 mg total) by mouth every 6 (six) hours as needed for severe pain. 6 tablet 0   phentermine (ADIPEX-P) 37.5 MG tablet Take 37.5 mg by mouth daily.     Semaglutide,0.25 or 0.5MG /DOS, (OZEMPIC, 0.25 OR 0.5 MG/DOSE,) 2 MG/1.5ML SOPN Inject 0.5 mg into the skin once a week.     WEGOVY 0.25 MG/0.5ML  SOAJ Inject 0.25 mg into the skin daily.     No current facility-administered medications on file prior to visit.    Allergies  Allergen Reactions   Other Swelling    Tongue and lip swelling Tongue and lip swelling Tongue and lip swelling    Acetaminophen-Pamabrom     Other reaction(s): Other (See Comments)   Compazine     convulsions   Fexofenadine     D version makes headaches worse   Midol [Aspirin-Cinnamedrine-Caffeine] Swelling    Tongue and lip swelling   Naproxen Sodium Swelling    Tongue and lip swelling     DIAGNOSTIC DATA (LABS, IMAGING, TESTING) - I reviewed patient records, labs, notes, testing and imaging myself where available.  Lab Results  Component Value Date   WBC 10.5 01/09/2022   HGB 14.3 01/09/2022   HCT 44.3 01/09/2022   MCV 90.8 01/09/2022   PLT 306 01/09/2022      Component Value Date/Time   NA 139 01/09/2022 1229   NA 140 02/03/2013 1601   K 4.2 01/09/2022 1229   CL 105 01/09/2022 1229   CO2 24 01/09/2022 1229   GLUCOSE 101 (H) 01/09/2022 1229   BUN 13 01/09/2022 1229   BUN 7 02/03/2013 1601   CREATININE 0.76 01/09/2022 1229   CALCIUM 9.3 01/09/2022 1229   PROT 7.2 06/05/2019 1041   PROT 6.9 02/03/2013 1601   ALBUMIN 4.5 06/05/2019 1041   ALBUMIN 4.7 02/03/2013 1601   AST 22  06/05/2019 1041   ALT 34 06/05/2019 1041   ALKPHOS 57 06/05/2019 1041   BILITOT 0.5 06/05/2019 1041   GFRNONAA >60 01/09/2022 1229   GFRAA >60 06/05/2019 1041   No results found for: "CHOL", "HDL", "LDLCALC", "LDLDIRECT", "TRIG", "CHOLHDL" Lab Results  Component Value Date   HGBA1C 5.6 06/05/2019   No results found for: "VITAMINB12" Lab Results  Component Value Date   TSH 1.230 10/16/2018    PHYSICAL EXAM:  Today's Vitals   02/19/23 1034  BP: 133/81  Pulse: 90  Weight: 222 lb 3.2 oz (100.8 kg)  Height: 5\' 1"  (1.549 m)   Body mass index is 41.98 kg/m.   Wt Readings from Last 3 Encounters:  02/19/23 222 lb 3.2 oz (100.8 kg)  09/11/21 229 lb (103.9 kg)  07/03/21 225 lb (102.1 kg)     Ht Readings from Last 3 Encounters:  02/19/23 5\' 1"  (1.549 m)  09/11/21 5\' 2"  (1.575 m)  07/03/21 5\' 2"  (1.575 m)      General:  The patient is awake, alert and appears not in acute distress. The patient is well groomed. Head: Normocephalic, atraumatic. Neck is supple. Mallampati 3 plus,  neck circumference:17. Nasal airflow -congestion noted  . Retrognathia is seen.  Cardiovascular:  Regular rate and rhythm, without  murmurs or carotid bruit, and without distended neck veins. Respiratory: Lungs are clear to auscultation. Skin:  Without evidence of edema, or rash Trunk: BMI is over 44- further increased over the last 18 month.  The patient's posture is erect   Neurologic exam :The patient is awake and alert, oriented to place and time.   Memory subjective described as intact. Attention span & concentration ability appears normal.  Speech is fluent, without dysarthria, dysphonia or aphasia. Mood and affect are appropriate.   Cranial nerves: Pupils are equal and briskly reactive to light. Extraocular movements  in vertical and horizontal planes intact and without nystagmus. Visual fields by finger perimetry are intact. Hearing impaired but improved with cochlear implant.  Facial  sensation intact to fine touch.  Facial motor strength is symmetric , her  tongue and uvula move midline. Shoulder shrug was symmetrical.    Motor exam:  Passive range of motion of the neck as well as active neck motion retroflexion rotation and lateral bending all lead to a audible crackles. Crepitus. Normal tone, muscle bulk and symmetric strength in all extremities.   Sensory:  Gait and station: Patient walks without assistive device and is able unassisted to climb up to the exam table. Strength within normal limits. Stance is stable and normal.  Romberg testing is  negative. Deep tendon reflexes: in the upper and lower extremities are symmetric and intact. Babinski maneuver response is deferred.     ASSESSMENT AND PLAN 62 y.o. 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   2) intractable migraines had improved under CPAP, but now she can't tolerate it anymore, not enough air flow, no data for 100 plus days.   3) she had lost weight, reached 190 pounds before her insurance coverage seized  coverage for Ozempic. She is looking forward to [facility] which she is apparently now allowed to get.   4) OSA is certainly still present, needs to retest for apnea for resetting the machine or new machine, and to get supplies -may need Bipap? She has brought her machine here.      Will order HST off CPAP for one night, follow up in 3-5 months from there. Last sleep study was 08-07-2027.   I plan to follow up either personally or through our NP within 4-6  months.   I would like to thank Noberto Retort, MD and Noberto Retort, Md [address] [place],  [place] [address] for allowing me to meet with and to take care of this pleasant patient.   After spending a total time of  30  minutes face to  face and additional time for physical and neurologic examination, review of laboratory studies,  personal review of imaging studies, reports and results of other testing and review of referral information / records as far as provided in visit,   Electronically signed by: Melvyn Novas, MD 02/19/2023 11:04 AM  [facility] Board certified by The [employer] of Sleep Medicine and Diplomate of the [employer] of [employer] Medicine. Board certified In Neurology through the ABPN, Fellow of the [employer] of Neu[employer].

## 2023-02-19 NOTE — Patient Instructions (Signed)
Hypersomnia Hypersomnia is a condition in which a person feels very tired during the day even though the person gets plenty of sleep at night. A person with this condition may take naps during the day and may find it very difficult to wake up from sleep. Hypersomnia may affect a person's ability to think, concentrate, drive, or remember things. What are the causes? The cause of this condition may not be known. Possible causes include: Taking certain medicines. Using drugs or alcohol. Sleep disorders, such as narcolepsy and sleep apnea. Injury to the head, brain, or spinal cord. Tumors. Certain medical conditions. These include: Depression. Diabetes. Gastroesophageal reflux disease (GERD). An underactive thyroid gland (hypothyroidism). What are the signs or symptoms? The main symptoms of hypersomnia include: Feeling very tired throughout the day, regardless of how much sleep you got the night before. Having trouble waking up. Others may find it difficult to wake you up when you are sleeping. Sleeping for longer and longer periods at a time. Taking naps throughout the day. Other symptoms may include: Feeling restless, anxious, or annoyed. Lacking energy. Having trouble with: Remembering. Speaking. Thinking. Loss of appetite. Seeing, hearing, tasting, smelling, or feeling things that are not real (hallucinations). How is this diagnosed? This condition may be diagnosed based on: Your symptoms and medical history. Your sleeping habits. Your health care provider may ask you to write down your sleeping habits in a daily sleep log, along with any symptoms you have. A series of tests that are done while you sleep (sleep study or polysomnogram). A test that measures how quickly you can fall asleep during the day (daytime nap study or multiple sleep latency test). How is this treated? This condition may be treated by: Following a regular sleep routine. Making lifestyle changes, such as  changing your eating habits, getting regular exercise, and avoiding alcohol or caffeinated beverages. Taking medicines to make you more alert (stimulants) during the day. Treating any underlying medical causes of hypersomnia. Follow these instructions at home: Sleep habits Stick to a routine that includes going to bed and waking up at the same times every day and night. Practice a relaxing bedtime routine. This may include reading, meditation, deep breathing, or taking a warm bath before going to sleep. Exercise regularly as told by your health care provider. However, avoid exercising in the hours right before bedtime. Keep your sleep environment at a cooler temperature, darkened, and quiet. Sleep with pillows and a mattress that are comfortable and supportive. Schedule short 20-minute naps for when you feel sleepiest during the day. Talk with your employer or teachers about your hypersomnia. If possible, adjust your schedule so that: You have a regular daytime work schedule. You can take a scheduled nap during the day. You do not have to work or be active at night. Do not eat a heavy meal for a few hours before bedtime. Eat your meals at about the same times every day. Safety  Do not drive or use machinery if you are sleepy. Ask your health care provider if it is safe for you to drive. Wear a life jacket when swimming or spending time near water. General instructions  Take over-the-counter and prescription medicines only as told by your health care provider. This includes supplements. Avoid drinking alcohol or caffeinated beverages. Keep a sleep log that will help your health care provider manage your condition. This may include information about: What time you go to bed each night. How often you wake up at night. How many hours   you sleep at night. How often and for how long you nap during the day. Any observations from others, such as leg movements during sleep, sleep walking, or  snoring. Keep all follow-up visits. This is important. Contact a health care provider if: You have new symptoms. Your symptoms get worse. Get help right away if: You have thoughts about hurting yourself or someone else. Get help right away if you feel like you may hurt yourself or others, or have thoughts about taking your own life. Go to your nearest emergency room or: Call 911. Call the National Suicide Prevention Lifeline at (614)428-1551 or 988. This is open 24 hours a day. Text the Crisis Text Line at (307)023-4669. Summary Hypersomnia refers to a condition in which you feel very tired during the day even though you get plenty of sleep at night. A person with this condition may take naps during the day and may find it very difficult to wake up from sleep. Hypersomnia may affect a person's ability to think, concentrate, drive, or remember things. Treatment may include a regular sleep routine and making some lifestyle changes. This information is not intended to replace advice given to you by your health care provider. Make sure you discuss any questions you have with your health care provider. Document Revised: 07/31/2021 Document Reviewed: 07/31/2021 Elsevier Patient Education  2024 [employer].

## 2023-03-11 ENCOUNTER — Telehealth: Payer: Self-pay | Admitting: Neurology

## 2023-03-11 NOTE — Telephone Encounter (Signed)
Pt would like a call to discuss scheduling sleep study

## 2023-03-12 NOTE — Telephone Encounter (Signed)
Pt is schedule for HST on 04/03/23 at 3pm

## 2023-03-13 NOTE — Telephone Encounter (Signed)
HST-(pt hard of hearing) no auth req'd by [employer]   Patient is scheduled at [facility] for 04/03/23 at 3 pm.  Mailed packet to the patient.

## 2023-03-14 ENCOUNTER — Other Ambulatory Visit (HOSPITAL_COMMUNITY): Payer: Self-pay

## 2023-03-14 MED ORDER — WEGOVY 1 MG/0.5ML ~~LOC~~ SOAJ
1.0000 mg | SUBCUTANEOUS | 0 refills | Status: AC
  Filled 2023-03-14: qty 2, 28d supply, fill #0

## 2023-03-20 DIAGNOSIS — A084 Viral intestinal infection, unspecified: Secondary | ICD-10-CM | POA: Diagnosis not present

## 2023-03-20 DIAGNOSIS — R238 Other skin changes: Secondary | ICD-10-CM | POA: Diagnosis not present

## 2023-04-03 ENCOUNTER — Ambulatory Visit: Payer: Federal, State, Local not specified - PPO | Admitting: Neurology

## 2023-04-03 DIAGNOSIS — Z9989 Dependence on other enabling machines and devices: Secondary | ICD-10-CM

## 2023-04-03 DIAGNOSIS — Z789 Other specified health status: Secondary | ICD-10-CM

## 2023-04-03 DIAGNOSIS — G4733 Obstructive sleep apnea (adult) (pediatric): Secondary | ICD-10-CM | POA: Diagnosis not present

## 2023-04-03 DIAGNOSIS — Z9621 Cochlear implant status: Secondary | ICD-10-CM

## 2023-04-04 NOTE — Progress Notes (Signed)
[facility]  Rhonda Simmons 62 year old female 1961/05/04    HOME SLEEP TEST REPORT ( by Watch PAT)   STUDY DATE:  04-04-2023    ORDERING CLINICIAN:  Melvyn Novas, MD  REFERRING CLINICIAN: Lavada Mesi    CLINICAL INFORMATION/HISTORY: patient with hearing impairment,cochlear implant, Migraine, TMJ, On CPAP for OSA but with variable compliance. Headgear interfered and irritated the site of the cochlear implant. Here for new baseline and alternative masks from DME to be ordered.    She has not used her CPAP in a while now.   Her Epworth sleepiness score is up to 14 points again that she is not longer using CPAP.   Epworth sleepiness score: 14 /24. FSS at 52/ 63 points    BMI:  42 kg/m   Neck Circumference: 17"   FINDINGS:   Sleep Summary:   Total Recording Time (hours, min): 9 hours 41 minutes      Total Sleep Time (hours, min):   8 hours 51 minutes              Percent REM (%):   10%                                     Respiratory Indices:   Calculated pAHI (per hour): 58.2/h                           REM pAHI:   57.3/h                                              NREM pAHI:   58.3/h                           Positional AHI: The patient slept 360 minutes in supine position with an AHI of 59.4 she slept 166 minutes on her left side with an AHI of 55.8.  This seems to be no positional component.  Snoring reached a mean volume of 41 dB and was present for about a quarter of the total recorded sleep time.                                                 Oxygen Saturation Statistics:   O2 Saturation Range (%):   Between a nadir at 80% and a maximum saturation of 98 with a mean saturation at 91%                                    O2 Saturation (minutes) <89%:    25.4 minutes  O2 saturation (minutes )<90%:    44.5 minutes        Pulse Rate Statistics:   Pulse Mean (bpm):   79 bpm              Pulse Range:     Between 63 and 104 bpm             IMPRESSION:  This HST confirms  the ongoing presence of severe sleep apnea.  The device did not indicate any central sleep apnea to be present so these are most likely all obstructive events, associated with significant oxygen desaturation.   RECOMMENDATION: There is unfortunately no better treatment than positive airway pressure, with the presence of hypoxia we would not use an inspire device and a dental device is unlikely to help either. The patient feels uncomfortable  with CPAP and is not tolerating the already low pressure well.  I will ask for her to change to BiPAP to improve tolerance-  settings of 4 cm H20 pressure spread , auto- BIPAP  from 8/4 cm  through 14/10 cm H20.  We can follow after 30 days with ONO on BIPAP.  The patient's apnea is certainly also related to her obesity and medical guided weight loss and medication assisting with weight loss would be a very important part to reduce her sleep apnea.      INTERPRETING PHYSICIAN:   Melvyn Novas, MD   [facility] Sleep Board certified by [employer] and Diplomate of the [employer]. Board certified In Neurology through the ABPN, Fellow of the [employer].

## 2023-04-05 ENCOUNTER — Other Ambulatory Visit (HOSPITAL_COMMUNITY): Payer: Self-pay

## 2023-04-05 DIAGNOSIS — Z9989 Dependence on other enabling machines and devices: Secondary | ICD-10-CM | POA: Insufficient documentation

## 2023-04-05 DIAGNOSIS — Z789 Other specified health status: Secondary | ICD-10-CM | POA: Insufficient documentation

## 2023-04-05 MED ORDER — WEGOVY 1.7 MG/0.75ML ~~LOC~~ SOAJ
1.7000 mg | SUBCUTANEOUS | 5 refills | Status: AC
  Filled 2023-04-05: qty 3, 28d supply, fill #0
  Filled 2023-05-07: qty 3, 28d supply, fill #1
  Filled 2023-06-05: qty 3, 28d supply, fill #2
  Filled 2023-07-09: qty 3, 28d supply, fill #3
  Filled 2023-08-08 – 2023-08-20 (×2): qty 3, 28d supply, fill #4

## 2023-04-05 NOTE — Addendum Note (Signed)
Addended by: Melvyn Novas on: 04/05/2023 01:12 PM   Modules accepted: Orders

## 2023-04-05 NOTE — Procedures (Signed)
[facility]  Rhonda Simmons 62 year old female 1961/05/04    HOME SLEEP TEST REPORT ( by Watch PAT)   STUDY DATE:  04-04-2023    ORDERING CLINICIAN:  Melvyn Novas, MD  REFERRING CLINICIAN: Lavada Mesi    CLINICAL INFORMATION/HISTORY: patient with hearing impairment,cochlear implant, Migraine, TMJ, On CPAP for OSA but with variable compliance. Headgear interfered and irritated the site of the cochlear implant. Here for new baseline and alternative masks from DME to be ordered.    She has not used her CPAP in a while now.   Her Epworth sleepiness score is up to 14 points again that she is not longer using CPAP.   Epworth sleepiness score: 14 /24. FSS at 52/ 63 points    BMI:  42 kg/m   Neck Circumference: 17"   FINDINGS:   Sleep Summary:   Total Recording Time (hours, min): 9 hours 41 minutes      Total Sleep Time (hours, min):   8 hours 51 minutes              Percent REM (%):   10%                                     Respiratory Indices:   Calculated pAHI (per hour): 58.2/h                           REM pAHI:   57.3/h                                              NREM pAHI:   58.3/h                           Positional AHI: The patient slept 360 minutes in supine position with an AHI of 59.4 she slept 166 minutes on her left side with an AHI of 55.8.  This seems to be no positional component.  Snoring reached a mean volume of 41 dB and was present for about a quarter of the total recorded sleep time.                                                 Oxygen Saturation Statistics:   O2 Saturation Range (%):   Between a nadir at 80% and a maximum saturation of 98 with a mean saturation at 91%                                    O2 Saturation (minutes) <89%:    25.4 minutes  O2 saturation (minutes )<90%:    44.5 minutes        Pulse Rate Statistics:   Pulse Mean (bpm):   79 bpm              Pulse Range:     Between 63 and 104 bpm             IMPRESSION:  This HST confirms  the ongoing presence of severe sleep apnea.  The device did not indicate any central sleep apnea to be present so these are most likely all obstructive events, associated with significant oxygen desaturation.   RECOMMENDATION: There is unfortunately no better treatment than positive airway pressure, with the presence of hypoxia we would not use an inspire device and a dental device is unlikely to help either. The patient feels uncomfortable  with CPAP and is not tolerating the already low pressure well.  I will ask for her to change to BiPAP to improve tolerance-  settings of 4 cm H20 pressure spread , auto- BIPAP  from 8/4 cm  through 14/10 cm H20.  We can follow after 30 days with ONO on BIPAP.  The patient's apnea is certainly also related to her obesity and medical guided weight loss and medication assisting with weight loss would be a very important part to reduce her sleep apnea.      INTERPRETING PHYSICIAN:   Melvyn Novas, MD   [facility] Sleep Board certified by [employer] and Diplomate of the [employer]. Board certified In Neurology through the ABP[employer], Fellow of the [employer].

## 2023-04-10 ENCOUNTER — Other Ambulatory Visit (HOSPITAL_COMMUNITY): Payer: Self-pay

## 2023-04-15 ENCOUNTER — Other Ambulatory Visit (HOSPITAL_COMMUNITY): Payer: Self-pay

## 2023-04-18 DIAGNOSIS — F324 Major depressive disorder, single episode, in partial remission: Secondary | ICD-10-CM | POA: Diagnosis not present

## 2023-04-18 DIAGNOSIS — I10 Essential (primary) hypertension: Secondary | ICD-10-CM | POA: Diagnosis not present

## 2023-04-18 DIAGNOSIS — E782 Mixed hyperlipidemia: Secondary | ICD-10-CM | POA: Diagnosis not present

## 2023-04-18 DIAGNOSIS — F419 Anxiety disorder, unspecified: Secondary | ICD-10-CM | POA: Diagnosis not present

## 2023-04-25 ENCOUNTER — Telehealth: Payer: Self-pay

## 2023-04-25 NOTE — Telephone Encounter (Signed)
Per Dr. Vickey Huger "I already called this patient and informed her that there is still a very high degree of sleep apnea present and associated hypoxia.  It is especially the low oxygen part that cannot be corrected by a dental device or a hypoglossal stimulator.   I understand that the patient has two areas of discomfort when using CPAP : 1 is the mask and headgear itself, which irritates the surgical site of her cochlear implant, 2) the other is that she feels she is suffocating- she does not get enough air but yet has problems exhaling.    I have therefore decided to change her from auto CPAP to an auto BiPAP machine.  If she tolerates this better and can continue to use it regularly, I would also want an overnight oximetry while on BiPAP to be tested."  I will send the order to [facility] to follow up with patient.

## 2023-04-25 NOTE — Telephone Encounter (Signed)
-----   Message from [place] Dohmeier sent at 04/05/2023  1:12 PM EDT ----- I already called this patient and informed her that there is still a very high degree of sleep apnea present and associated hypoxia.  It is especially the low oxygen part that cannot be corrected by a dental device or a hypoglossal stimulator.   I understand that the patient has two areas of discomfort when using CPAP : 1 is the mask and headgear itself, which irritates the surgical site of her cochlear implant, 2) the other is that she feels she is suffocating- she does not get enough air but yet has problems exhaling.   I have therefore decided to change her from auto CPAP to an auto BiPAP machine.  If she tolerates this better and can continue to use it regularly, I would also want an overnight oximetry while on BiPAP to be tested.

## 2023-04-29 ENCOUNTER — Telehealth: Payer: Self-pay

## 2023-06-04 DIAGNOSIS — N898 Other specified noninflammatory disorders of vagina: Secondary | ICD-10-CM | POA: Diagnosis not present

## 2023-06-04 DIAGNOSIS — N760 Acute vaginitis: Secondary | ICD-10-CM | POA: Diagnosis not present

## 2023-06-04 DIAGNOSIS — R300 Dysuria: Secondary | ICD-10-CM | POA: Diagnosis not present

## 2023-06-04 DIAGNOSIS — R319 Hematuria, unspecified: Secondary | ICD-10-CM | POA: Diagnosis not present

## 2023-06-05 ENCOUNTER — Other Ambulatory Visit (HOSPITAL_COMMUNITY): Payer: Self-pay

## 2023-06-12 ENCOUNTER — Other Ambulatory Visit (HOSPITAL_COMMUNITY): Payer: Self-pay

## 2023-06-13 DIAGNOSIS — B3731 Acute candidiasis of vulva and vagina: Secondary | ICD-10-CM | POA: Diagnosis not present

## 2023-06-13 DIAGNOSIS — R309 Painful micturition, unspecified: Secondary | ICD-10-CM | POA: Diagnosis not present

## 2023-06-13 DIAGNOSIS — Z760 Encounter for issue of repeat prescription: Secondary | ICD-10-CM | POA: Diagnosis not present

## 2023-06-13 DIAGNOSIS — N941 Unspecified dyspareunia: Secondary | ICD-10-CM | POA: Diagnosis not present

## 2023-06-14 DIAGNOSIS — Z23 Encounter for immunization: Secondary | ICD-10-CM | POA: Diagnosis not present

## 2023-06-24 ENCOUNTER — Telehealth: Payer: Self-pay | Admitting: Neurology

## 2023-06-24 ENCOUNTER — Encounter: Payer: Self-pay | Admitting: Neurology

## 2023-06-24 ENCOUNTER — Ambulatory Visit: Payer: Federal, State, Local not specified - PPO | Admitting: Neurology

## 2023-06-24 NOTE — Telephone Encounter (Signed)
Pt has called to report she has not been contacted by DME regarding her CPAP, pt does not recall if she is to be contacted regarding the replacement of the machine or needed CPAP supplies, please call.

## 2023-06-24 NOTE — Telephone Encounter (Signed)
Message was sent on 8/26. Will FU with Nida Boatman

## 2023-06-25 NOTE — Telephone Encounter (Addendum)
IF PT RETURNS CALL, PLEASE INFORM HER I REACHED OUT TO THE DME AND THEY HAVE RECEIVED HER ORDER. THEY HAVE TRIED TO REACH HER MULTIPLE TIMES WITH NO CONTACT.  SHE WILL NEED TO CALL THEM. [facility] [phone].   Contacted pt, LVM rq a call back.

## 2023-06-26 NOTE — Telephone Encounter (Signed)
New, Maryruth Bun, Abbe Amsterdam, CMA; Aldean Baker, Efraim Kaufmann; 1 other Good Morning Sheryle Hail,  We apsoke to patient yesterday and per out local RT team: Patient states she wanted a mask sent to her as her request. 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 My branch manager has approved for me to mail patient two masks today. I am mailing them out today.  Thank you,  Luellen Pucker

## 2023-07-09 ENCOUNTER — Other Ambulatory Visit (HOSPITAL_COMMUNITY): Payer: Self-pay

## 2023-07-17 DIAGNOSIS — L249 Irritant contact dermatitis, unspecified cause: Secondary | ICD-10-CM | POA: Diagnosis not present

## 2023-07-17 DIAGNOSIS — L218 Other seborrheic dermatitis: Secondary | ICD-10-CM | POA: Diagnosis not present

## 2023-07-17 DIAGNOSIS — L02821 Furuncle of head [any part, except face]: Secondary | ICD-10-CM | POA: Diagnosis not present

## 2023-08-16 ENCOUNTER — Other Ambulatory Visit (HOSPITAL_COMMUNITY): Payer: Self-pay

## 2023-08-20 ENCOUNTER — Ambulatory Visit: Payer: Federal, State, Local not specified - PPO | Admitting: Neurology

## 2023-08-20 ENCOUNTER — Telehealth: Payer: Self-pay | Admitting: Neurology

## 2023-08-20 ENCOUNTER — Other Ambulatory Visit (HOSPITAL_COMMUNITY): Payer: Self-pay

## 2023-08-20 NOTE — Telephone Encounter (Signed)
Pt has called to cancel her appointment today due to having to attend to her mother and sick pet.

## 2023-08-21 ENCOUNTER — Other Ambulatory Visit (HOSPITAL_COMMUNITY): Payer: Self-pay

## 2023-08-21 MED ORDER — WEGOVY 2.4 MG/0.75ML ~~LOC~~ SOAJ
0.7500 mL | SUBCUTANEOUS | 6 refills | Status: AC
  Filled 2023-08-21 – 2023-09-10 (×2): qty 3, 28d supply, fill #0

## 2023-08-26 DIAGNOSIS — N952 Postmenopausal atrophic vaginitis: Secondary | ICD-10-CM | POA: Diagnosis not present

## 2023-08-26 DIAGNOSIS — R319 Hematuria, unspecified: Secondary | ICD-10-CM | POA: Diagnosis not present

## 2023-09-02 ENCOUNTER — Other Ambulatory Visit (HOSPITAL_COMMUNITY): Payer: Self-pay

## 2023-09-10 ENCOUNTER — Other Ambulatory Visit (HOSPITAL_COMMUNITY): Payer: Self-pay

## 2023-09-11 DIAGNOSIS — H9193 Unspecified hearing loss, bilateral: Secondary | ICD-10-CM | POA: Diagnosis not present

## 2023-09-11 DIAGNOSIS — M545 Low back pain, unspecified: Secondary | ICD-10-CM | POA: Diagnosis not present

## 2023-09-11 DIAGNOSIS — M25561 Pain in right knee: Secondary | ICD-10-CM | POA: Diagnosis not present

## 2023-09-12 ENCOUNTER — Other Ambulatory Visit (HOSPITAL_COMMUNITY): Payer: Self-pay

## 2023-09-16 ENCOUNTER — Other Ambulatory Visit (HOSPITAL_COMMUNITY): Payer: Self-pay

## 2023-09-20 ENCOUNTER — Other Ambulatory Visit (HOSPITAL_COMMUNITY): Payer: Self-pay

## 2023-10-01 ENCOUNTER — Other Ambulatory Visit (HOSPITAL_COMMUNITY): Payer: Self-pay

## 2023-10-14 DIAGNOSIS — H9193 Unspecified hearing loss, bilateral: Secondary | ICD-10-CM | POA: Diagnosis not present

## 2023-10-14 DIAGNOSIS — M26622 Arthralgia of left temporomandibular joint: Secondary | ICD-10-CM | POA: Diagnosis not present

## 2023-10-14 DIAGNOSIS — Z9621 Cochlear implant status: Secondary | ICD-10-CM | POA: Diagnosis not present

## 2023-10-26 DIAGNOSIS — M25562 Pain in left knee: Secondary | ICD-10-CM | POA: Diagnosis not present

## 2023-10-26 DIAGNOSIS — M25561 Pain in right knee: Secondary | ICD-10-CM | POA: Diagnosis not present

## 2023-10-26 DIAGNOSIS — M25552 Pain in left hip: Secondary | ICD-10-CM | POA: Diagnosis not present

## 2023-10-31 DIAGNOSIS — R509 Fever, unspecified: Secondary | ICD-10-CM | POA: Diagnosis not present

## 2023-10-31 DIAGNOSIS — J22 Unspecified acute lower respiratory infection: Secondary | ICD-10-CM | POA: Diagnosis not present

## 2023-10-31 DIAGNOSIS — R051 Acute cough: Secondary | ICD-10-CM | POA: Diagnosis not present

## 2023-11-05 DIAGNOSIS — F324 Major depressive disorder, single episode, in partial remission: Secondary | ICD-10-CM | POA: Diagnosis not present

## 2023-11-05 DIAGNOSIS — E782 Mixed hyperlipidemia: Secondary | ICD-10-CM | POA: Diagnosis not present

## 2023-11-05 DIAGNOSIS — J40 Bronchitis, not specified as acute or chronic: Secondary | ICD-10-CM | POA: Diagnosis not present

## 2023-11-05 DIAGNOSIS — I10 Essential (primary) hypertension: Secondary | ICD-10-CM | POA: Diagnosis not present

## 2023-11-22 DIAGNOSIS — R053 Chronic cough: Secondary | ICD-10-CM | POA: Diagnosis not present

## 2024-01-09 DIAGNOSIS — M25511 Pain in right shoulder: Secondary | ICD-10-CM | POA: Diagnosis not present

## 2024-01-09 DIAGNOSIS — M542 Cervicalgia: Secondary | ICD-10-CM | POA: Diagnosis not present

## 2024-02-04 ENCOUNTER — Other Ambulatory Visit (HOSPITAL_COMMUNITY): Payer: Self-pay

## 2024-02-04 DIAGNOSIS — M542 Cervicalgia: Secondary | ICD-10-CM | POA: Diagnosis not present

## 2024-02-04 DIAGNOSIS — Z6841 Body Mass Index (BMI) 40.0 and over, adult: Secondary | ICD-10-CM | POA: Diagnosis not present

## 2024-02-04 MED ORDER — ZEPBOUND 2.5 MG/0.5ML ~~LOC~~ SOAJ
2.5000 mg | SUBCUTANEOUS | 0 refills | Status: AC
  Filled 2024-02-04: qty 2, 28d supply, fill #0

## 2024-02-06 ENCOUNTER — Other Ambulatory Visit: Payer: Self-pay | Admitting: Family Medicine

## 2024-02-06 ENCOUNTER — Encounter: Payer: Self-pay | Admitting: Family Medicine

## 2024-02-06 ENCOUNTER — Other Ambulatory Visit (HOSPITAL_COMMUNITY): Payer: Self-pay

## 2024-02-06 DIAGNOSIS — M542 Cervicalgia: Secondary | ICD-10-CM

## 2024-02-06 MED ORDER — WEGOVY 0.25 MG/0.5ML ~~LOC~~ SOAJ
0.2500 mg | SUBCUTANEOUS | 0 refills | Status: AC
  Filled 2024-02-06: qty 2, 28d supply, fill #0

## 2024-02-21 ENCOUNTER — Ambulatory Visit
Admission: RE | Admit: 2024-02-21 | Discharge: 2024-02-21 | Disposition: A | Discharge status: Home / Self Care | Source: Ambulatory Visit | Attending: Family Medicine | Admitting: Family Medicine

## 2024-02-21 DIAGNOSIS — M47812 Spondylosis without myelopathy or radiculopathy, cervical region: Secondary | ICD-10-CM | POA: Diagnosis not present

## 2024-02-21 DIAGNOSIS — M542 Cervicalgia: Secondary | ICD-10-CM

## 2024-02-21 DIAGNOSIS — Z981 Arthrodesis status: Secondary | ICD-10-CM | POA: Diagnosis not present

## 2024-02-21 DIAGNOSIS — M5021 Other cervical disc displacement,  high cervical region: Secondary | ICD-10-CM | POA: Diagnosis not present

## 2024-02-21 MED ORDER — GADOPICLENOL 0.5 MMOL/ML IV SOLN
10.0000 mL | Freq: Once | INTRAVENOUS | Status: AC | PRN
  Administered 2024-02-21: 10 mL via INTRAVENOUS

## 2024-02-25 DIAGNOSIS — M25562 Pain in left knee: Secondary | ICD-10-CM | POA: Diagnosis not present

## 2024-02-25 DIAGNOSIS — M542 Cervicalgia: Secondary | ICD-10-CM | POA: Diagnosis not present

## 2024-02-26 DIAGNOSIS — M4802 Spinal stenosis, cervical region: Secondary | ICD-10-CM | POA: Diagnosis not present

## 2024-02-26 DIAGNOSIS — G5601 Carpal tunnel syndrome, right upper limb: Secondary | ICD-10-CM | POA: Diagnosis not present

## 2024-03-09 DIAGNOSIS — R200 Anesthesia of skin: Secondary | ICD-10-CM | POA: Diagnosis not present

## 2024-03-09 DIAGNOSIS — M542 Cervicalgia: Secondary | ICD-10-CM | POA: Diagnosis not present

## 2024-03-10 DIAGNOSIS — M542 Cervicalgia: Secondary | ICD-10-CM | POA: Diagnosis not present

## 2024-03-15 DIAGNOSIS — R110 Nausea: Secondary | ICD-10-CM | POA: Diagnosis not present

## 2024-03-15 DIAGNOSIS — R0982 Postnasal drip: Secondary | ICD-10-CM | POA: Diagnosis not present

## 2024-03-15 DIAGNOSIS — B029 Zoster without complications: Secondary | ICD-10-CM | POA: Diagnosis not present

## 2024-03-18 DIAGNOSIS — R5383 Other fatigue: Secondary | ICD-10-CM | POA: Diagnosis not present

## 2024-03-18 DIAGNOSIS — B029 Zoster without complications: Secondary | ICD-10-CM | POA: Diagnosis not present

## 2024-03-31 DIAGNOSIS — Z1231 Encounter for screening mammogram for malignant neoplasm of breast: Secondary | ICD-10-CM | POA: Diagnosis not present

## 2024-03-31 DIAGNOSIS — Z6841 Body Mass Index (BMI) 40.0 and over, adult: Secondary | ICD-10-CM | POA: Diagnosis not present

## 2024-03-31 DIAGNOSIS — Z01419 Encounter for gynecological examination (general) (routine) without abnormal findings: Secondary | ICD-10-CM | POA: Diagnosis not present

## 2024-04-18 DIAGNOSIS — R21 Rash and other nonspecific skin eruption: Secondary | ICD-10-CM | POA: Diagnosis not present

## 2024-04-30 DIAGNOSIS — M7912 Myalgia of auxiliary muscles, head and neck: Secondary | ICD-10-CM | POA: Diagnosis not present

## 2024-04-30 DIAGNOSIS — M67912 Unspecified disorder of synovium and tendon, left shoulder: Secondary | ICD-10-CM | POA: Diagnosis not present

## 2024-05-19 DIAGNOSIS — M542 Cervicalgia: Secondary | ICD-10-CM | POA: Diagnosis not present

## 2024-05-19 DIAGNOSIS — M7912 Myalgia of auxiliary muscles, head and neck: Secondary | ICD-10-CM | POA: Diagnosis not present

## 2024-05-22 DIAGNOSIS — Z6841 Body Mass Index (BMI) 40.0 and over, adult: Secondary | ICD-10-CM | POA: Diagnosis not present

## 2024-05-22 DIAGNOSIS — Z713 Dietary counseling and surveillance: Secondary | ICD-10-CM | POA: Diagnosis not present

## 2024-06-01 DIAGNOSIS — F324 Major depressive disorder, single episode, in partial remission: Secondary | ICD-10-CM | POA: Diagnosis not present

## 2024-06-01 DIAGNOSIS — I10 Essential (primary) hypertension: Secondary | ICD-10-CM | POA: Diagnosis not present

## 2024-06-01 DIAGNOSIS — F419 Anxiety disorder, unspecified: Secondary | ICD-10-CM | POA: Diagnosis not present

## 2024-06-01 DIAGNOSIS — Z23 Encounter for immunization: Secondary | ICD-10-CM | POA: Diagnosis not present

## 2024-06-01 DIAGNOSIS — E782 Mixed hyperlipidemia: Secondary | ICD-10-CM | POA: Diagnosis not present

## 2024-06-01 DIAGNOSIS — M2550 Pain in unspecified joint: Secondary | ICD-10-CM | POA: Diagnosis not present

## 2024-06-02 DIAGNOSIS — M47812 Spondylosis without myelopathy or radiculopathy, cervical region: Secondary | ICD-10-CM | POA: Diagnosis not present

## 2024-06-02 DIAGNOSIS — M4802 Spinal stenosis, cervical region: Secondary | ICD-10-CM | POA: Diagnosis not present

## 2024-06-16 DIAGNOSIS — M47812 Spondylosis without myelopathy or radiculopathy, cervical region: Secondary | ICD-10-CM | POA: Diagnosis not present

## 2024-06-19 DIAGNOSIS — H04123 Dry eye syndrome of bilateral lacrimal glands: Secondary | ICD-10-CM | POA: Diagnosis not present

## 2024-06-19 DIAGNOSIS — H5203 Hypermetropia, bilateral: Secondary | ICD-10-CM | POA: Diagnosis not present

## 2024-06-19 DIAGNOSIS — H40033 Anatomical narrow angle, bilateral: Secondary | ICD-10-CM | POA: Diagnosis not present

## 2024-07-06 DIAGNOSIS — M47812 Spondylosis without myelopathy or radiculopathy, cervical region: Secondary | ICD-10-CM | POA: Diagnosis not present

## 2024-07-23 DIAGNOSIS — Z713 Dietary counseling and surveillance: Secondary | ICD-10-CM | POA: Diagnosis not present

## 2024-07-23 DIAGNOSIS — Z6841 Body Mass Index (BMI) 40.0 and over, adult: Secondary | ICD-10-CM | POA: Diagnosis not present

## 2024-08-06 DIAGNOSIS — M546 Pain in thoracic spine: Secondary | ICD-10-CM | POA: Diagnosis not present

## 2024-08-06 DIAGNOSIS — M542 Cervicalgia: Secondary | ICD-10-CM | POA: Diagnosis not present

## 2024-08-18 DIAGNOSIS — L648 Other androgenic alopecia: Secondary | ICD-10-CM | POA: Diagnosis not present

## 2024-08-18 DIAGNOSIS — D1801 Hemangioma of skin and subcutaneous tissue: Secondary | ICD-10-CM | POA: Diagnosis not present

## 2024-08-31 DIAGNOSIS — R5383 Other fatigue: Secondary | ICD-10-CM | POA: Diagnosis not present

## 2024-08-31 DIAGNOSIS — I10 Essential (primary) hypertension: Secondary | ICD-10-CM | POA: Diagnosis not present

## 2024-08-31 DIAGNOSIS — M542 Cervicalgia: Secondary | ICD-10-CM | POA: Diagnosis not present

## 2024-08-31 DIAGNOSIS — F419 Anxiety disorder, unspecified: Secondary | ICD-10-CM | POA: Diagnosis not present

## 2024-08-31 DIAGNOSIS — F324 Major depressive disorder, single episode, in partial remission: Secondary | ICD-10-CM | POA: Diagnosis not present

## 2024-08-31 DIAGNOSIS — M2550 Pain in unspecified joint: Secondary | ICD-10-CM | POA: Diagnosis not present

## 2024-08-31 DIAGNOSIS — E782 Mixed hyperlipidemia: Secondary | ICD-10-CM | POA: Diagnosis not present
